# Patient Record
Sex: Female | Born: 1955 | Race: Black or African American | Hispanic: No | Marital: Single | Smoking: Current some day smoker
Health system: Southern US, Community
[De-identification: ages and names within clinical notes are randomized; demographics above are authoritative.]

## PROBLEM LIST (undated history)

## (undated) DIAGNOSIS — T7840XA Allergy, unspecified, initial encounter: Secondary | ICD-10-CM

## (undated) DIAGNOSIS — F329 Major depressive disorder, single episode, unspecified: Secondary | ICD-10-CM

## (undated) DIAGNOSIS — F32A Depression, unspecified: Secondary | ICD-10-CM

## (undated) DIAGNOSIS — M1990 Unspecified osteoarthritis, unspecified site: Secondary | ICD-10-CM

## (undated) DIAGNOSIS — D259 Leiomyoma of uterus, unspecified: Secondary | ICD-10-CM

## (undated) DIAGNOSIS — Z9289 Personal history of other medical treatment: Secondary | ICD-10-CM

## (undated) DIAGNOSIS — T63301A Toxic effect of unspecified spider venom, accidental (unintentional), initial encounter: Secondary | ICD-10-CM

## (undated) HISTORY — DX: Major depressive disorder, single episode, unspecified: F32.9

## (undated) HISTORY — DX: Allergy, unspecified, initial encounter: T78.40XA

## (undated) HISTORY — DX: Personal history of other medical treatment: Z92.89

## (undated) HISTORY — DX: Leiomyoma of uterus, unspecified: D25.9

## (undated) HISTORY — PX: SHOULDER SURGERY: SHX246

## (undated) HISTORY — DX: Depression, unspecified: F32.A

## (undated) HISTORY — DX: Toxic effect of unspecified spider venom, accidental (unintentional), initial encounter: T63.301A

## (undated) HISTORY — DX: Unspecified osteoarthritis, unspecified site: M19.90

## (undated) HISTORY — PX: TUBAL LIGATION: SHX77

---

## 2000-05-17 ENCOUNTER — Encounter: Admission: RE | Admit: 2000-05-17 | Discharge: 2000-05-17 | Payer: Self-pay | Admitting: Obstetrics

## 2002-11-04 ENCOUNTER — Encounter: Admission: RE | Admit: 2002-11-04 | Discharge: 2002-11-04 | Payer: Self-pay | Admitting: Sports Medicine

## 2003-12-19 ENCOUNTER — Encounter (INDEPENDENT_AMBULATORY_CARE_PROVIDER_SITE_OTHER): Payer: Self-pay | Admitting: *Deleted

## 2003-12-25 ENCOUNTER — Encounter: Admission: RE | Admit: 2003-12-25 | Discharge: 2003-12-25 | Payer: Self-pay | Admitting: Family Medicine

## 2003-12-28 ENCOUNTER — Encounter: Admission: RE | Admit: 2003-12-28 | Discharge: 2003-12-28 | Payer: Self-pay | Admitting: Sports Medicine

## 2004-01-14 ENCOUNTER — Encounter: Admission: RE | Admit: 2004-01-14 | Discharge: 2004-01-14 | Payer: Self-pay | Admitting: Family Medicine

## 2004-01-21 ENCOUNTER — Encounter: Admission: RE | Admit: 2004-01-21 | Discharge: 2004-01-21 | Payer: Self-pay | Admitting: Sports Medicine

## 2004-08-18 ENCOUNTER — Ambulatory Visit: Payer: Self-pay | Admitting: Family Medicine

## 2004-09-01 ENCOUNTER — Ambulatory Visit: Payer: Self-pay | Admitting: Sports Medicine

## 2004-09-19 ENCOUNTER — Ambulatory Visit: Payer: Self-pay | Admitting: Family Medicine

## 2005-05-16 ENCOUNTER — Ambulatory Visit: Payer: Self-pay | Admitting: Sports Medicine

## 2006-12-11 ENCOUNTER — Ambulatory Visit: Payer: Self-pay | Admitting: Family Medicine

## 2006-12-11 ENCOUNTER — Encounter (INDEPENDENT_AMBULATORY_CARE_PROVIDER_SITE_OTHER): Payer: Self-pay | Admitting: Family Medicine

## 2006-12-11 LAB — CONVERTED CEMR LAB
ALT: 23 units/L (ref 0–35)
AST: 28 units/L (ref 0–37)
Alkaline Phosphatase: 71 units/L (ref 39–117)
Creatinine, Ser: 0.85 mg/dL (ref 0.40–1.20)
Sodium: 143 meq/L (ref 135–145)
Total Bilirubin: 0.4 mg/dL (ref 0.3–1.2)

## 2006-12-13 DIAGNOSIS — N920 Excessive and frequent menstruation with regular cycle: Secondary | ICD-10-CM

## 2006-12-13 DIAGNOSIS — F339 Major depressive disorder, recurrent, unspecified: Secondary | ICD-10-CM | POA: Insufficient documentation

## 2006-12-13 DIAGNOSIS — D259 Leiomyoma of uterus, unspecified: Secondary | ICD-10-CM

## 2006-12-13 HISTORY — DX: Leiomyoma of uterus, unspecified: D25.9

## 2006-12-14 ENCOUNTER — Encounter (INDEPENDENT_AMBULATORY_CARE_PROVIDER_SITE_OTHER): Payer: Self-pay | Admitting: *Deleted

## 2007-01-10 ENCOUNTER — Encounter (INDEPENDENT_AMBULATORY_CARE_PROVIDER_SITE_OTHER): Payer: Self-pay | Admitting: Family Medicine

## 2007-01-10 ENCOUNTER — Encounter: Admission: RE | Admit: 2007-01-10 | Discharge: 2007-01-10 | Payer: Self-pay | Admitting: Sports Medicine

## 2007-01-24 ENCOUNTER — Encounter (INDEPENDENT_AMBULATORY_CARE_PROVIDER_SITE_OTHER): Payer: Self-pay | Admitting: Family Medicine

## 2007-02-12 ENCOUNTER — Encounter (INDEPENDENT_AMBULATORY_CARE_PROVIDER_SITE_OTHER): Payer: Self-pay | Admitting: Family Medicine

## 2007-02-15 ENCOUNTER — Emergency Department (HOSPITAL_COMMUNITY): Admission: EM | Admit: 2007-02-15 | Discharge: 2007-02-15 | Payer: Self-pay | Admitting: Emergency Medicine

## 2007-08-17 ENCOUNTER — Emergency Department (HOSPITAL_COMMUNITY): Admission: EM | Admit: 2007-08-17 | Discharge: 2007-08-17 | Payer: Self-pay | Admitting: Emergency Medicine

## 2007-09-26 ENCOUNTER — Ambulatory Visit (HOSPITAL_COMMUNITY): Admission: RE | Admit: 2007-09-26 | Discharge: 2007-09-26 | Payer: Self-pay | Admitting: Obstetrics & Gynecology

## 2008-02-25 ENCOUNTER — Emergency Department (HOSPITAL_COMMUNITY): Admission: EM | Admit: 2008-02-25 | Discharge: 2008-02-25 | Payer: Self-pay | Admitting: Emergency Medicine

## 2008-08-31 ENCOUNTER — Ambulatory Visit: Payer: Self-pay | Admitting: Family Medicine

## 2008-08-31 DIAGNOSIS — E669 Obesity, unspecified: Secondary | ICD-10-CM | POA: Insufficient documentation

## 2008-08-31 DIAGNOSIS — M654 Radial styloid tenosynovitis [de Quervain]: Secondary | ICD-10-CM

## 2008-08-31 DIAGNOSIS — M159 Polyosteoarthritis, unspecified: Secondary | ICD-10-CM

## 2008-09-16 ENCOUNTER — Encounter: Payer: Self-pay | Admitting: Family Medicine

## 2008-09-16 ENCOUNTER — Ambulatory Visit: Payer: Self-pay | Admitting: Family Medicine

## 2008-09-16 LAB — CONVERTED CEMR LAB
HDL: 70 mg/dL (ref >39)
LDL Cholesterol: 86 mg/dL (ref 0–99)
Total CHOL/HDL Ratio: 2.5
VLDL: 21 mg/dL (ref 0–40)

## 2008-09-17 ENCOUNTER — Encounter: Payer: Self-pay | Admitting: Family Medicine

## 2008-10-07 ENCOUNTER — Ambulatory Visit: Payer: Self-pay | Admitting: Family Medicine

## 2008-11-02 ENCOUNTER — Ambulatory Visit: Payer: Self-pay | Admitting: Family Medicine

## 2008-11-02 ENCOUNTER — Encounter: Payer: Self-pay | Admitting: Family Medicine

## 2008-11-02 DIAGNOSIS — M25539 Pain in unspecified wrist: Secondary | ICD-10-CM

## 2009-02-10 ENCOUNTER — Emergency Department (HOSPITAL_COMMUNITY): Admission: EM | Admit: 2009-02-10 | Discharge: 2009-02-10 | Payer: Self-pay | Admitting: Emergency Medicine

## 2009-10-18 ENCOUNTER — Encounter (INDEPENDENT_AMBULATORY_CARE_PROVIDER_SITE_OTHER): Payer: Self-pay | Admitting: *Deleted

## 2009-10-18 DIAGNOSIS — F17200 Nicotine dependence, unspecified, uncomplicated: Secondary | ICD-10-CM

## 2010-03-02 ENCOUNTER — Encounter: Admission: RE | Admit: 2010-03-02 | Discharge: 2010-03-02 | Payer: Self-pay | Admitting: Family Medicine

## 2010-03-02 ENCOUNTER — Ambulatory Visit: Payer: Self-pay | Admitting: Family Medicine

## 2010-03-02 ENCOUNTER — Encounter: Payer: Self-pay | Admitting: Family Medicine

## 2010-03-02 DIAGNOSIS — B351 Tinea unguium: Secondary | ICD-10-CM | POA: Insufficient documentation

## 2010-03-02 DIAGNOSIS — K625 Hemorrhage of anus and rectum: Secondary | ICD-10-CM | POA: Insufficient documentation

## 2010-03-02 DIAGNOSIS — D4959 Neoplasm of unspecified behavior of other genitourinary organ: Secondary | ICD-10-CM | POA: Insufficient documentation

## 2010-03-03 ENCOUNTER — Encounter: Payer: Self-pay | Admitting: Family Medicine

## 2010-03-03 LAB — CONVERTED CEMR LAB
ALT: 19 units/L (ref 0–35)
CO2: 23 meq/L (ref 19–32)
Chloride: 107 meq/L (ref 96–112)
Cholesterol: 197 mg/dL (ref 0–200)
LDL Cholesterol: 106 mg/dL — ABNORMAL HIGH (ref 0–99)
MCV: 81.4 fL (ref 78.0–100.0)
Platelets: 278 10*3/uL (ref 150–400)
Potassium: 4.3 meq/L (ref 3.5–5.3)
Sodium: 140 meq/L (ref 135–145)
Total Bilirubin: 0.3 mg/dL (ref 0.3–1.2)
Total Protein: 7.4 g/dL (ref 6.0–8.3)
VLDL: 17 mg/dL (ref 0–40)
WBC: 9.4 10*3/uL (ref 4.0–10.5)

## 2010-03-04 ENCOUNTER — Encounter: Payer: Self-pay | Admitting: Family Medicine

## 2010-03-04 LAB — CONVERTED CEMR LAB
Pap Smear: NEGATIVE
Pap Smear: NORMAL

## 2010-03-07 ENCOUNTER — Encounter: Admission: RE | Admit: 2010-03-07 | Discharge: 2010-03-07 | Payer: Self-pay | Admitting: Family Medicine

## 2010-10-27 ENCOUNTER — Emergency Department (HOSPITAL_COMMUNITY)
Admission: EM | Admit: 2010-10-27 | Discharge: 2010-10-27 | Payer: Self-pay | Source: Home / Self Care | Admitting: Family Medicine

## 2010-11-15 NOTE — Miscellaneous (Signed)
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 

## 2010-11-15 NOTE — Miscellaneous (Signed)
Summary: prior auth   please call patient and tell her to get the medicine for her toenails through [facility] $[facility] list so we don't have to do prior authorization.  please send [facility]fine [facility] choice. thanks, Ancil Boozer  MD  Mar 03, 2010 10:42 AM  No answer and no machine at home number.  She does not get to work till [employer].  Will try again later. ............................................... Doris Green Mar 03, 2010 11:58 AM  Attempted both numbers again.  No answer. ............................................... Doris Green Mar 03, 2010 5:26 PM  will await call back from patient. Ancil Boozer  MD  Mar 04, 2010 2:25 PM    Clinical Lists Changes  Appended Document: prior auth unable to reach pt

## 2010-11-15 NOTE — Assessment & Plan Note (Signed)
Summary: cpe and pap/kh   Vital Signs:  Patient profile:   55 year old female Menstrual status:  postmenopausal Height:      63 inches Weight:      222.7 pounds BMI:     39.59 Temp:     97.8 degrees F oral Pulse rate:   73 / minute BP sitting:   124 / 85  (left arm) Cuff size:   regular  Vitals Entered By: Garen Grams LPN (Mar 02, 2010 10:25 AM)  CC: cpe  Is Patient Diabetic? No Pain Assessment Patient in pain? no          Menstrual Status postmenopausal Last PAP Result NEGATIVE FOR INTRAEPITHELIAL LESIONS OR MALIGNANCY.   CC:  cpe .  History of Present Illness: here for CPE + PAP but has a few other concerns she wishes to address:  1) wrist pain: has been going on for years in R wrist.  overall feels like it is getting worse.  worsened by using vacuum cleaner for example at work which is a repetitive motion.  hasn't tried any medicines to help.  has been wearing thumb spica without relief.  was dx as tendonitis in the past but she isn't sure if this is the case.  she does note she has joint aches and pains diffusely but this is the worst area.  it hurts the worst over the prominence of the thumb and hurts sometimes so much in the thumb that it will radiate up the arm.  it doesn't feel numb ever.  she does think it swells sometimes.    2) toenails: has noticed thickened and peeling toenails for some time now.  wonders what she can do to treat them.   Habits & Providers  Alcohol-Tobacco-Diet     Alcohol drinks/day: 0     Tobacco Status: quit     Tobacco Counseling: to remain off tobacco products     Cigarette Packs/Day: <0.25     Pack years: 73 ish  Exercise-Depression-Behavior     Have you felt down or hopeless? no     Have you felt little pleasure in things? no     Depression Counseling: not indicated; screening negative for depression     STD Risk: never     Drug Use: never  Current Medications (verified): 1)  Thumbkeeper Splint .... For Dequervains  Tendonitis 2)  Terbinafine Hcl 250 Mg Tabs (Terbinafine Hcl) .Marland Kitchen.. 1 By Mouth Once Daily For 12 Weeks 3)  Meloxicam 7.5 Mg Tabs (Meloxicam) .Marland Kitchen.. 1 By Mouth Qam For Wrist Pain  Allergies (verified): No Known Drug Allergies  Past History:  Past Medical History:  former TOBACCO USER (ICD-305.1) WRIST PAIN, RIGHT (ICD-719.43) ? 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, HTN Brother Sherrine Maples): arthritis Brother Casimiro Needle): Healthy  Kids [place], [place], [place]): dtr with menses problems  Social History: single, 3 kids Channing Mutters, [place], [place]).  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 Quit smoking in 2011 with occasional relapse.  Denies ETOH.  No sexual activity since 2000, h/o GC.Smoking Status:  quit Drug Use:  never STD Risk:  never Packs/Day:  <0.25  Review of Systems       DENIES: headaches, chest pain, shortness of breath, abdominal pain, constipation, diarrhea, urinary urgency, urinary frequency, dysuria, urinary or stool incontinence, rashes, runny nose, cough, depression  COMPLAINS OF: occasional blood in stool, itching in vaginal area, joint aches and pains  Physical Exam  General:  Obese, well-developed/groomed adult female in no acute distress. VS reviewed.   Head:  Normocephalic and atraumatic without obvious abnormalities Eyes:  No corneal or conjunctival  inflammation noted. EOMI. Perrla.  Vision grossly normal. Ears:  External ear exam shows no significant lesions or deformities.  Otoscopic examination reveals clear canals, tympanic membranes are intact bilaterally without bulging, retraction, inflammation or discharge. Hearing is grossly normal bilaterally. Nose:  External nasal examination shows no deformity or inflammation. Nasal mucosa are pink and moist without lesions or exudates. Mouth:  Oral mucosa and oropharynx without lesions or exudates. multiple missing teeth but teeth present are in remarkably good repair.  Neck:  No deformities, masses, or tenderness noted. Lungs:  Normal respiratory effort, chest expands symmetrically. Lungs are clear to auscultation, no crackles or wheezes. Heart:  Normal rate and regular rhythm. S1 and S2 normal without gallop, murmur, click, rub or other extra sounds. Abdomen:  Bowel sounds positive,abdomen soft and non-tender without masses, organomegaly or hernias noted. Genitalia:  normal introitus.  plaque like area of scale to inferior right edge of introitous with some pigmented macules surrounding it with signs of excoriation.  nontender.   no vaginal discharge with mucosa pink and moist no vaginal or cervical lesions, no vaginal atrophy, friability or hemorrhage. uterus size and position grossly normal no adenexal masses or tenderness pap performed Msk:  R wrist with full range of motion.  R thumb with full range of motion. tenderness over thenar prominence. no joint swelling or warmth in R wrist/hand tender to palpation over thenar prominence R hand.   negative tinel's sign right hand negative finkelstein's test right hand pain with resisted extension of the thumb. Pulses:  2+ in all extremities Extremities:  No clubbing, cyanosis, edema, or deformity noted with normal full range of motion of all joints.   Neurologic:  alert & oriented X3, cranial nerves II-XII intact, and gait normal.   Skin:   multiple skin tags and seborrheic keratoses on body Psych:  Oriented X3 and good eye contact.  anxious appearing   Impression & Recommendations:  Problem # 1:  ROUTINE GYNECOLOGICAL EXAMINATION (ICD-V72.31) Assessment Unchanged overall in excellent health.  due for screening mammo and colonoscopy - handouts given for both to be scheduled.  pap performed today along with screening bloodwork.    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  will start some steroids topically and monitor for improvement.  if not significant improvement in 6 wks then would biopsy to be certain not cancerous.  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  discussed with patient that likely this will take up to 12 wks, maybe longer to be cured.     Her updated medication list for this problem includes:    Terbinafine Hcl 250 Mg Tabs (Terbinafine hcl) .Marland Kitchen... 1 by mouth once daily for 12 weeks  Orders: Comp Met-[facility] [id number]) [facility]- Est Level  3 (95621)  Problem # 4:  RECTAL BLEEDING (ICD-569.3) Assessment: New referred for colonoscopy.  check cbc.    Orders: CBC-[facility] (30865) [facility]- Est Level  3 (78469)  Complete Medication List: 1)  Thumbkeeper Splint  .... For dequervains tendonitis 2)  Terbinafine Hcl 250 Mg Tabs (Terbinafine hcl) .Marland Kitchen.. 1 by mouth once daily for 12 weeks 3)  Meloxicam 7.5 Mg Tabs (Meloxicam) .Marland Kitchen.. 1 by mouth qam for wrist pain  Other Orders: Pap Smear-[facility] ([id number]) Lipid-[facility] ([id number]) TSH-[facility] ([id number]) Diagnostic X-Ray/Fluoroscopy (Diagnostic X-Ray/Flu)  Patient Instructions: 1)  I will call you with lab and xray results and plans for follow up. 2)  be sure to schedule your mammogram and colonoscopy.  Prescriptions: MELOXICAM 7.5 MG TABS (MELOXICAM) 1  by mouth QAM for wrist pain  #30 x 1   Entered and Authorized by:   Ancil Boozer  MD   Signed by:   Ancil Boozer  MD on 03/02/2010   Method used:   Electronically to        [facility] [address] [phone]* (retail)       [address]       [place], [place]  [address]       Ph: [phone] or [phone]       Fax: [phone]   RxID:   [id number] TERBINAFINE HCL 250 MG TABS (TERBINAFINE HCL) 1 by mouth once daily for 12 weeks  #120 x 0   Entered and Authorized by:   Ancil Boozer  MD   Signed by:   Ancil Boozer  MD on 03/02/2010   Method used:   Electronically to        [facility] [address] [phone]* (retail)       [address]       [place], [place]  [address]       Ph: [phone] or [phone]       Fax: [phone]   RxID:   [id number]   Prevention & Chronic Care Immunizations   Influenza vaccine: Not documented    Tetanus booster: Not documented    Pneumococcal vaccine: Not documented  Colorectal Screening   Hemoccult: Not documented   Hemoccult due: Refused  (09/16/2008)    Colonoscopy: Not documented   Colonoscopy due: Refused  (09/16/2008)  Other Screening   Pap smear: NEGATIVE FOR INTRAEPITHELIAL LESIONS OR MALIGNANCY.  (09/16/2008)   Pap smear due: 09/17/2011    Mammogram: normal  (01/10/2007)   Mammogram due: 01/2008   Smoking status: quit  (03/02/2010)  Lipids   Total Cholesterol: 177  (09/16/2008)   LDL: 86  (09/16/2008)   LDL Direct: Not documented   HDL: 70  (09/16/2008)   Triglycerides: 103  (09/16/2008)  Laboratory Results  Date/Time Received: Mar 02, 2010 11:32 AM  Date/Time Reported: Mar 02, 2010 11:56 AM   Other Tests  Skin KOH: Negative Comments: ...............test performed by......Marland KitchenBonnie A. [place], MLS (ASCP)cm

## 2010-11-15 NOTE — Letter (Signed)
Summary: Generic Letter  [facility]  [address]   [place], [place] [address]   Phone: [phone]  Fax: [phone]    03/04/2010  Doris Green [address] [place], [place]  [address]  Dear [name],   Your recent pap smear was normal. Your next pap is due in 3 years.       Sincerely,   Ancil Boozer  MD  Appended Document: Generic Letter mailed.

## 2010-12-21 ENCOUNTER — Ambulatory Visit (INDEPENDENT_AMBULATORY_CARE_PROVIDER_SITE_OTHER): Payer: BC Managed Care – PPO | Admitting: Family Medicine

## 2010-12-21 ENCOUNTER — Encounter: Payer: Self-pay | Admitting: Family Medicine

## 2010-12-21 ENCOUNTER — Ambulatory Visit (HOSPITAL_COMMUNITY)
Admission: RE | Admit: 2010-12-21 | Discharge: 2010-12-21 | Disposition: A | Discharge status: Home / Self Care | Payer: BC Managed Care – PPO | Source: Ambulatory Visit | Attending: Family Medicine | Admitting: Family Medicine

## 2010-12-21 VITALS — BP 132/78 | HR 72 | Temp 98.1°F | Wt 224.7 lb

## 2010-12-21 DIAGNOSIS — M7730 Calcaneal spur, unspecified foot: Secondary | ICD-10-CM | POA: Insufficient documentation

## 2010-12-21 DIAGNOSIS — M79671 Pain in right foot: Secondary | ICD-10-CM

## 2010-12-21 DIAGNOSIS — M25579 Pain in unspecified ankle and joints of unspecified foot: Secondary | ICD-10-CM | POA: Insufficient documentation

## 2010-12-21 DIAGNOSIS — M79609 Pain in unspecified limb: Secondary | ICD-10-CM

## 2010-12-21 IMAGING — CR DG ANKLE 2V *R*
2 series · 2 of 2 positions shown · non-contrast
Comparison: Plain films of the right ankle [DATE].

CLINICAL DATA: Pain and swelling.

RIGHT ANKLE - 2 VIEW

[t ankle joint ap right]
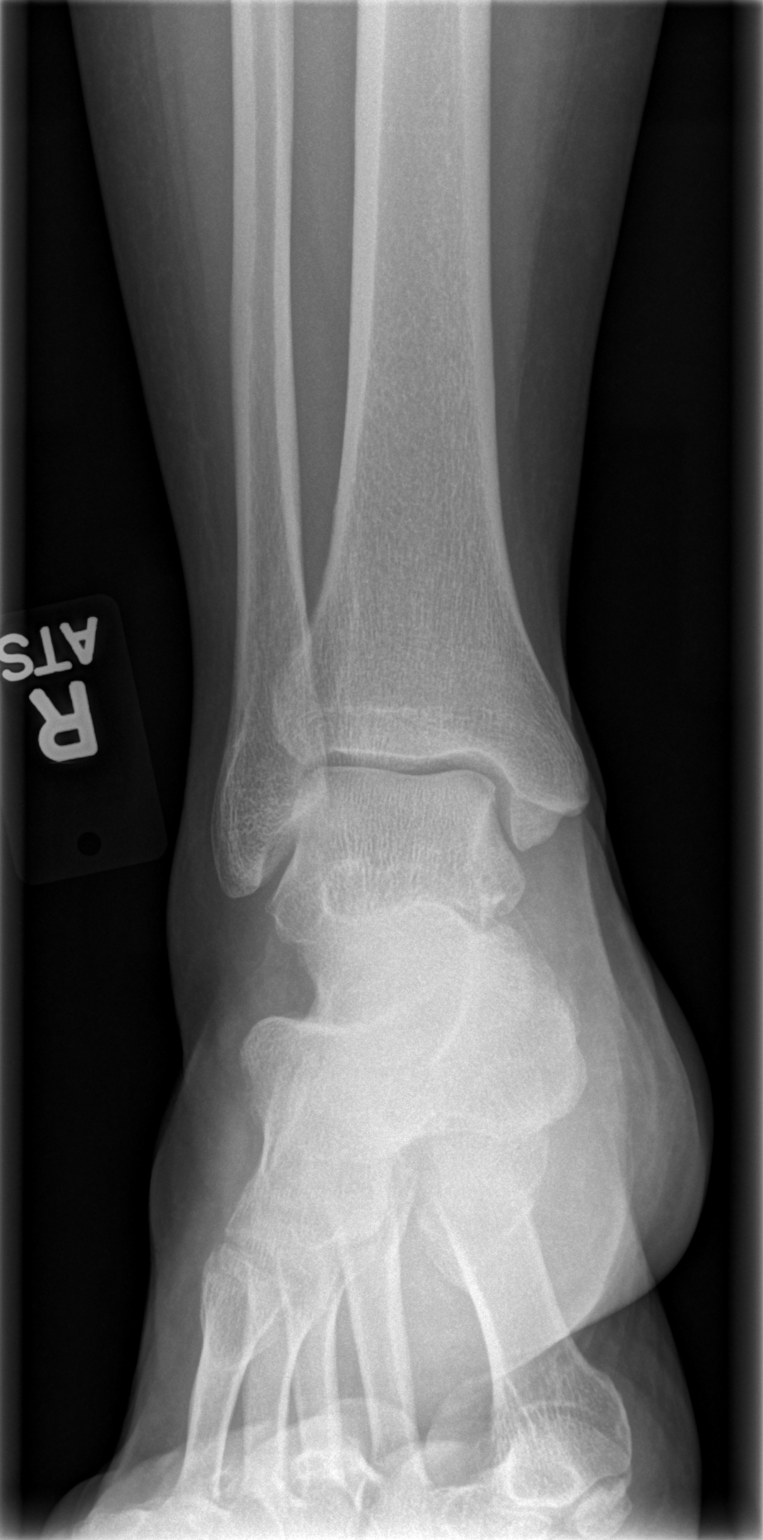

[t ankle joint lat right]
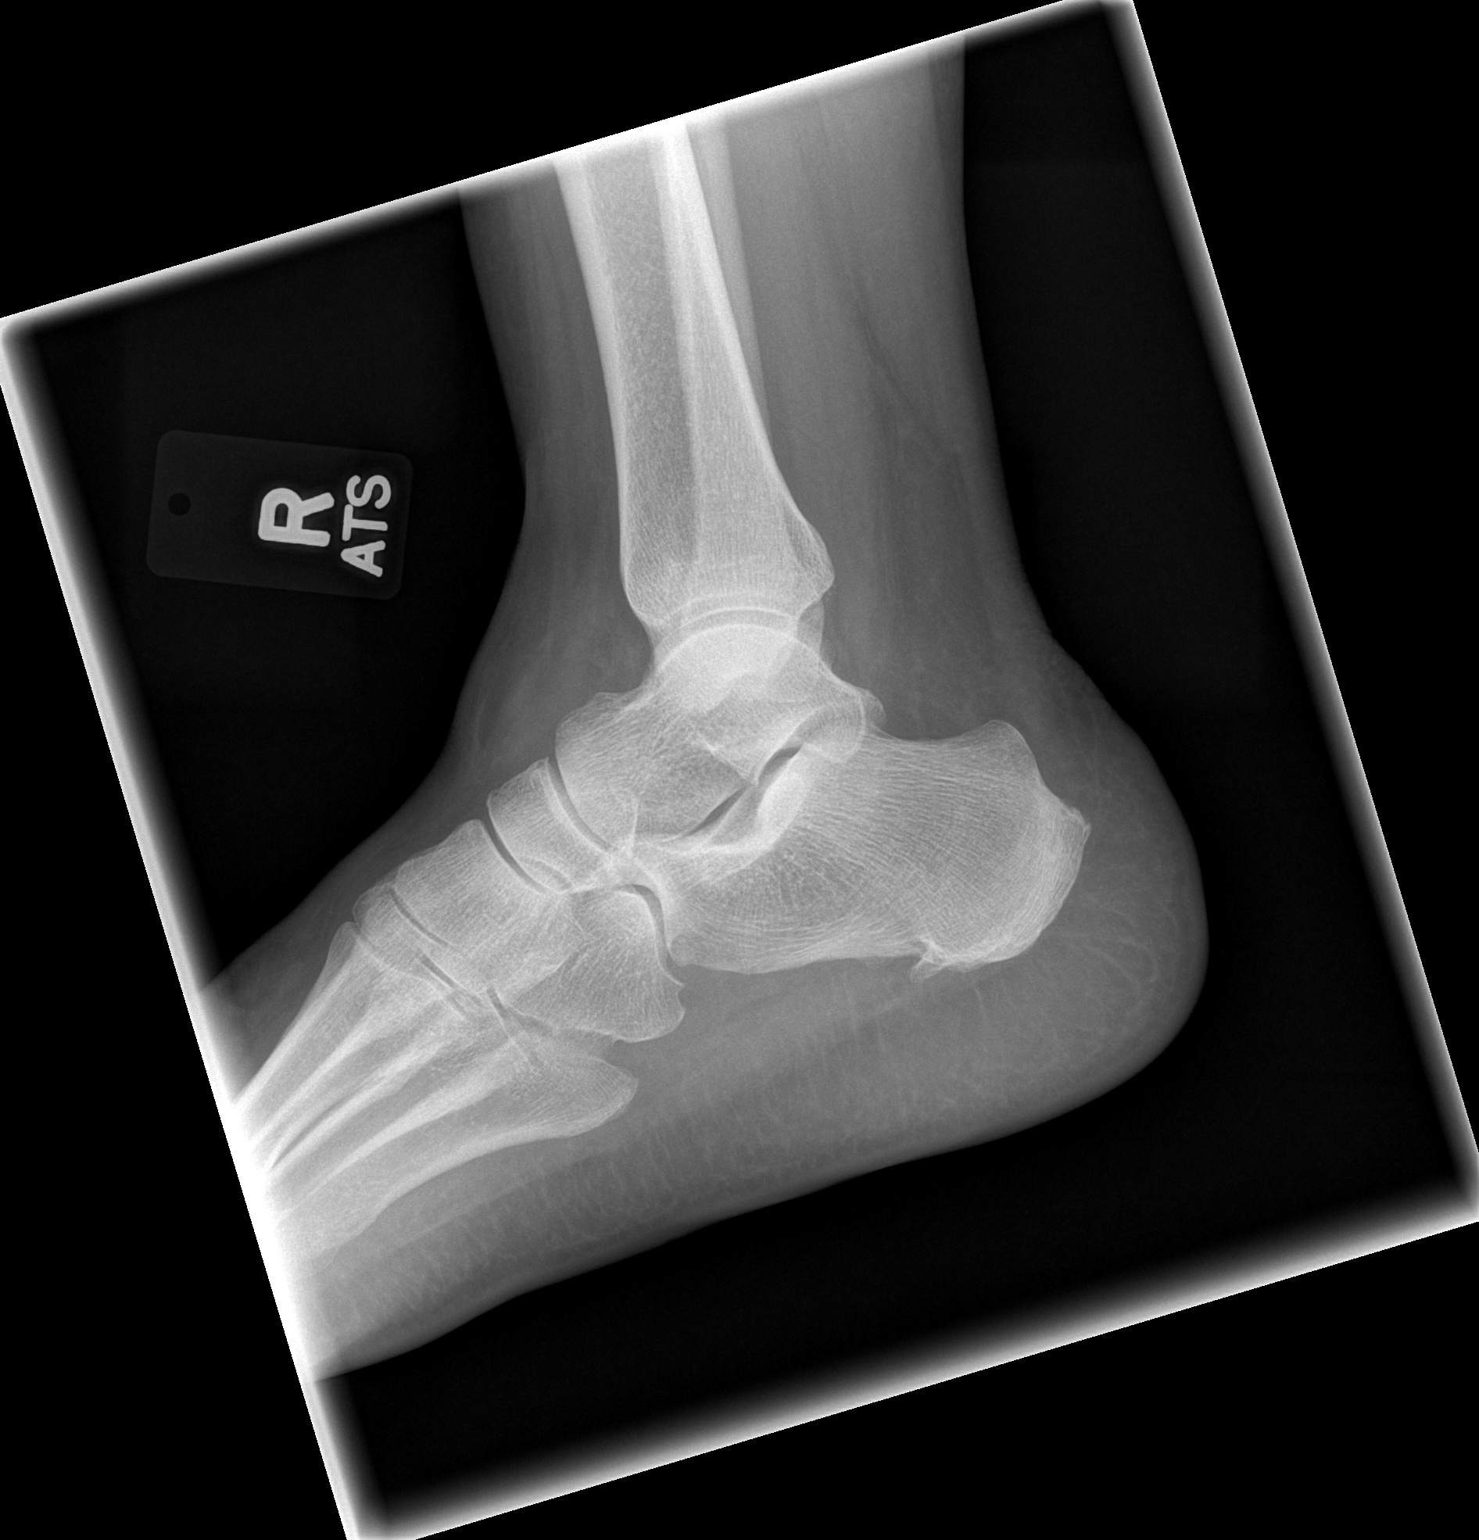

[2 of 2 positions shown; findings below may reference images not displayed]

FINDINGS: No acute bony or joint abnormality is identified.  The
patient has an unchanged small plantar calcaneal spur.  Soft
tissues unremarkable.
IMPRESSION: No change in a small plantar calcaneal spur.  No acute finding.

## 2010-12-21 MED ORDER — IBUPROFEN 600 MG PO TABS: 600.0000 mg | ORAL_TABLET | Freq: Four times a day (QID) | ORAL | Status: DC | PRN

## 2010-12-21 NOTE — Assessment & Plan Note (Signed)
Most likely achilles tendonitis.  Has been there for about 6 months.  Getting a little bit worse.  Worse in the morning.  She has not tried anything for it.  Will get x-ray to rule out stress fracture.  May need referral to sports medicine for ultrasound and ? NTG patch.  Will treat with NSAIDs for now and gave patient handout for home treatment.

## 2010-12-21 NOTE — Progress Notes (Signed)
  Subjective:    Patient ID: De Blanch, female    DOB: 08-06-1956, 55 y.o.   MRN: [id number]  Ankle Pain  Incident onset: No incident. There was no injury mechanism. The pain is present in the right heel (In the tendon and into the right heel). The quality of the pain is described as aching, burning, cramping and shooting. The pain is at a severity of 5/10. The pain is moderate. The pain has been worsening since onset. Associated symptoms include a loss of motion. Pertinent negatives include no inability to bear weight, loss of sensation, muscle weakness, numbness or tingling. The symptoms are aggravated by movement (worse in the morning). She has tried nothing for the symptoms.      Review of Systems  Neurological: Negative for tingling and numbness.       Objective:   Physical Exam  Vitals reviewed. Constitutional:       Obese   Musculoskeletal:       Right ankle:  TTP along achilles tendon and into the heel.  Pain reproduced with plantar flexion.  Limited dorsiflexion because of stiffness.  Right plantar fascia:  Non tender  Left ankle: normal ROM.  No tenderness          Assessment & Plan:

## 2010-12-21 NOTE — Patient Instructions (Signed)
Achilles Tendonitis (Tendinitis) Tendonitis a swelling and soreness of the tendon. The pain in the tendon (cord like structure which attaches muscle to bone) is produced by tiny tears and the inflammation present in that tendon. It commonly occurs at the shoulders, heels, and elbows. It is usually caused by overusing the tendon and joint involved. Achilles tendonitis involves the Achilles tendon. This is the large tendon in the back of the leg just above the foot. It attaches the large muscles of the lower leg to the heel bone (called calcaneus).   This diagnosis (learning what is wrong) is made by examination. X-rays will be generally be normal if only tendonitis is present. HOME CARE INSTRUCTIONS  Apply ice to the injury for 10 minutes 4 times per day. Put the ice in a plastic bag and place a towel between the bag of ice and your skin.   Try to avoid use other than gentle range of motion while the tendon is painful. Do not resume use until instructed by your caregiver. Then begin use gradually. Do not increase use to the point of pain. If pain does develop, decrease use and continue the above measures. Gradually increase activities that do not cause discomfort until you gradually achieve normal use.   Only take over-the-counter or prescription medicines for pain, discomfort, or fever as directed by your caregiver.  SEEK MEDICAL CARE IF:  Your pain and swelling increase or pain is uncontrolled with medications.   You develop new, unexplained problems (symptoms) or an increase of the symptoms that brought you to your caregiver.   You develop an inability to move your toes or foot, develop warmth and swelling in your foot, or begin running an unexplained temperature.  MAKE SURE YOU:    Understand these instructions.   Will watch your condition.   Will get help right away if you are not doing well or get worse.   Please schedule a follow up appointment in 2-3 weeks to check on your pain  We  will let you know of the x-ray results Document Released: 07/12/2005 Document Re-Released: 12/29/2008 [facility] Patient Information 2011 [place], [place].

## 2010-12-22 ENCOUNTER — Telehealth: Payer: Self-pay | Admitting: Family Medicine

## 2010-12-22 NOTE — Telephone Encounter (Signed)
Discussed with patient her x-ray results and that there were no fractures.

## 2011-01-30 ENCOUNTER — Emergency Department (HOSPITAL_COMMUNITY)
Admission: EM | Admit: 2011-01-30 | Discharge: 2011-01-30 | Disposition: A | Discharge status: Home / Self Care | Payer: BC Managed Care – PPO | Attending: Emergency Medicine | Admitting: Emergency Medicine

## 2011-01-30 DIAGNOSIS — M7989 Other specified soft tissue disorders: Secondary | ICD-10-CM | POA: Insufficient documentation

## 2011-01-30 DIAGNOSIS — IMO0002 Reserved for concepts with insufficient information to code with codable children: Secondary | ICD-10-CM | POA: Insufficient documentation

## 2011-04-05 ENCOUNTER — Other Ambulatory Visit: Payer: Self-pay | Admitting: Family Medicine

## 2011-04-05 DIAGNOSIS — Z1231 Encounter for screening mammogram for malignant neoplasm of breast: Secondary | ICD-10-CM

## 2011-04-20 ENCOUNTER — Other Ambulatory Visit: Payer: Self-pay | Admitting: Family Medicine

## 2011-04-20 ENCOUNTER — Ambulatory Visit (HOSPITAL_COMMUNITY)
Admission: RE | Admit: 2011-04-20 | Discharge: 2011-04-20 | Disposition: A | Discharge status: Home / Self Care | Payer: BC Managed Care – PPO | Source: Ambulatory Visit | Attending: Family Medicine | Admitting: Family Medicine

## 2011-04-20 ENCOUNTER — Ambulatory Visit: Payer: BC Managed Care – PPO

## 2011-04-20 DIAGNOSIS — Z1231 Encounter for screening mammogram for malignant neoplasm of breast: Secondary | ICD-10-CM | POA: Insufficient documentation

## 2011-06-01 ENCOUNTER — Inpatient Hospital Stay (INDEPENDENT_AMBULATORY_CARE_PROVIDER_SITE_OTHER)
Admission: RE | Admit: 2011-06-01 | Discharge: 2011-06-01 | Disposition: A | Discharge status: Home / Self Care | Payer: BC Managed Care – PPO | Source: Ambulatory Visit | Attending: Emergency Medicine | Admitting: Emergency Medicine

## 2011-06-01 DIAGNOSIS — K625 Hemorrhage of anus and rectum: Secondary | ICD-10-CM

## 2011-07-25 LAB — CBC
Hemoglobin: 12.4
MCHC: 32.6
MCV: 81.5
RBC: 4.65
WBC: 11.4 — ABNORMAL HIGH

## 2011-07-25 LAB — POCT URINALYSIS DIP (DEVICE)
Protein, ur: NEGATIVE
Specific Gravity, Urine: >=1.03
pH: 5.5

## 2011-07-25 LAB — URINE CULTURE

## 2011-07-25 LAB — URINALYSIS, MICROSCOPIC ONLY
Bilirubin Urine: NEGATIVE
Glucose, UA: NEGATIVE
Ketones, ur: NEGATIVE
Protein, ur: NEGATIVE
pH: 5.5

## 2011-07-25 LAB — DIFFERENTIAL
Basophils Relative: 0
Eosinophils Absolute: 0.1
Lymphs Abs: 3.8 — ABNORMAL HIGH
Monocytes Absolute: 0.4
Monocytes Relative: 4
Neutro Abs: 7

## 2011-07-25 LAB — POCT PREGNANCY, URINE: Operator id: 247071

## 2011-07-25 LAB — POCT I-STAT CREATININE: Operator id: 247071

## 2011-07-25 LAB — I-STAT 8, (EC8 V) (CONVERTED LAB)
BUN: 11
Glucose, Bld: 115 — ABNORMAL HIGH
Potassium: 3.3 — ABNORMAL LOW
TCO2: 25
pH, Ven: 7.401 — ABNORMAL HIGH

## 2011-10-12 ENCOUNTER — Encounter: Payer: Self-pay | Admitting: Family Medicine

## 2011-10-12 ENCOUNTER — Ambulatory Visit (INDEPENDENT_AMBULATORY_CARE_PROVIDER_SITE_OTHER): Payer: BC Managed Care – PPO | Admitting: Family Medicine

## 2011-10-12 VITALS — BP 139/94 | HR 80 | Temp 98.2°F | Ht 63.0 in | Wt 224.9 lb

## 2011-10-12 DIAGNOSIS — M79609 Pain in unspecified limb: Secondary | ICD-10-CM

## 2011-10-12 DIAGNOSIS — R232 Flushing: Secondary | ICD-10-CM

## 2011-10-12 DIAGNOSIS — M79671 Pain in right foot: Secondary | ICD-10-CM

## 2011-10-12 DIAGNOSIS — F17200 Nicotine dependence, unspecified, uncomplicated: Secondary | ICD-10-CM

## 2011-10-12 DIAGNOSIS — E669 Obesity, unspecified: Secondary | ICD-10-CM

## 2011-10-12 DIAGNOSIS — Z0000 Encounter for general adult medical examination without abnormal findings: Secondary | ICD-10-CM

## 2011-10-12 DIAGNOSIS — R51 Headache: Secondary | ICD-10-CM

## 2011-10-12 DIAGNOSIS — N951 Menopausal and female climacteric states: Secondary | ICD-10-CM

## 2011-10-12 DIAGNOSIS — F339 Major depressive disorder, recurrent, unspecified: Secondary | ICD-10-CM

## 2011-10-12 LAB — LIPID PANEL
Cholesterol: 189 mg/dL (ref 0–200)
HDL: 66 mg/dL (ref >39)
Total CHOL/HDL Ratio: 2.9 Ratio
Triglycerides: 168 mg/dL — ABNORMAL HIGH (ref <150)
VLDL: 34 mg/dL (ref 0–40)

## 2011-10-12 MED ORDER — SPENCO ORTHOTIC ARCH SUPPORTS MISC: 1.0000 [IU] | Freq: Every day | Status: DC | PRN

## 2011-10-12 MED ORDER — ACETAMINOPHEN 325 MG PO TABS: 650.0000 mg | ORAL_TABLET | Freq: Three times a day (TID) | ORAL | Status: AC | PRN

## 2011-10-12 NOTE — Patient Instructions (Addendum)
For your right foot, try the Tylenol 650 mg every 8 hours as needed for pain.  Continue to do the foot stretches. We will also refer you to physical therapy.  Wear flat, well-cushioned shoes. Try the orthotic foot inserts at the pharmacy.  Follow-up in 1 month for this problem.  Pains in head. Try the Tylenol. Let me know if this gets worse. Follow-up in 1 month for this problem also.   If your lab results are normal, I will send you a letter with the results. If abnormal, someone at the clinic will get in touch with you.   We will also discuss your hot flashes at that time.  It was nice to meet you.  Take care and Happy New Year.

## 2011-10-12 NOTE — Assessment & Plan Note (Signed)
New problem. Started 2-3 months ago. Not headachey pain but seems to have sharp pains along scalp lasting few moments and occuring a few times a day. Patient concerned since this is how her family members' multiple sclerosis first presented. At this time, advised to take Tylenol prn. Will follow-up in 1 month. No neurological abnormalities. Patient advised to let me know if she develops worsening head pains or other worrisome symptoms and advised to come in sooner if needed.

## 2011-10-12 NOTE — Assessment & Plan Note (Signed)
Documentation only. Patient advised to make appointment to discuss hot flashes further. Due to multiple medical issues today, we did not have time to talk about this.

## 2011-10-12 NOTE — Assessment & Plan Note (Signed)
Patient has diagnosis of depression but is not on any medications currently. She has had a sister and ?nephew pass away recently but seems to be handling things well. Does not appear depressed today.

## 2011-10-12 NOTE — Assessment & Plan Note (Signed)
Persistent pain. She has not followed-up with this issue but brought this issue up during scheduled physical exam visit today. She reports pain is getting worse and now radiating up her leg. No worrisome neurological exam findings today and ROM seems improved from her previous visit for this issue in March 2012. Ibuprofen caused GI bleeding. Recommending Tylenol and referring to physical therapy. Shown ROM exercises today. If pain worse at next visit in 1 month, will refer to Sports Medicine for ultrasound and management.

## 2011-10-12 NOTE — Assessment & Plan Note (Signed)
Relapsed. Had quit for several months but re-started due to recent stressors. Patient wants to try quitting again. Instead of smoking, will try to take a walk or do some other activity when she feels stressed and has urge to smoke.

## 2011-10-12 NOTE — Progress Notes (Signed)
**Note Doris-Identified via Obfuscation**   Subjective:    Patient ID: Doris Green, female    DOB: Jan 14, 1956, 55 y.o.   MRN: [id number]  HPI Here initially for physical but wanted to discuss discrete issues instead:  1. Right heel pain Last saw physician in March and this issue was discussed.  Progression: worsening now pain is radiating up leg to hip Severity: bothers her but does not limit what she does. 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 Prickly pains in scalp Lasts few seconds. Has been going on past 2-3 months.  Location: different points throughout head, usually top left side of head Alleviated by: nothing Associated with: not with photophobia or nausea/vomiting Exacerbated by: nothing Concerns: couple of family members with multiple sclerosis (recently passed away, sister and ?nephew) and this is how they first presented ROS: denies depression, feels like she is coping well with recent death of sister a few months ago and nephew 3 days before Christmas  3. Tobacco Smoking 2 cigarettes daily Stopped smoking several months ago but re-started following death of nephew Doesn't like taste of smoking but helps relieve her  Father died of lung cancer and she knows smoking is bad for her  4. Hot flashes Severe LMP several years ago ROS: denies spotting. Not sexually active currently.    Review of Systems Per HPI. Denies vision changes Denies loss of bladder or bowel    Objective:   Physical Exam Gen: NAD, obese Psych: appears anxious, slightly irritated initially (felt like in the past, coming to clinic did not help her problems), appropriate to questions Neuro: alert and oriented; no focal deficits; normal gait; normal sensation and strength lower extremities CV: RRR no m/r/g Pulm: CTAB no w/r/r, no increased WOB, good effort  Abd: NABS, soft, NT, ND, obese Ext: no edema MSK:   RF: ROM intact; no pain with  dorsi or plantar flexion (or along plantar aspect of foot with these maneuvers); mild pain with palpation of achilles tendon; no warmth, erythema, or swelling bilaterally    Assessment & Plan:

## 2011-10-19 ENCOUNTER — Encounter: Payer: Self-pay | Admitting: Family Medicine

## 2011-10-23 ENCOUNTER — Telehealth: Payer: Self-pay | Admitting: Family Medicine

## 2011-10-23 NOTE — Telephone Encounter (Signed)
Spoke with patient and provided clarification on labs. In MD's letter to patient she asks patient to call if interested in health coaching. Patient would like to take advantage of this. Will forward to [place] to set up patient.

## 2011-10-23 NOTE — Telephone Encounter (Signed)
Would like to speak to someone about her letter regarding her physical.

## 2011-10-24 ENCOUNTER — Telehealth: Payer: Self-pay | Admitting: Home Health Services

## 2011-10-24 NOTE — Telephone Encounter (Signed)
Spoke with pt about health coaching and will schedule appointment for February.

## 2011-10-24 NOTE — Telephone Encounter (Signed)
Spoke with Doris Green  Pt is interested in [employer] for smoking cessation, exercise and weight loss.  Pt is highly motivated and has already stopped smoking for 8 days.  Pt also identifies not having a social life as a problem and snacking late at night.  Pt also reports not having an exercise routine.  Pt would like to schedule Health Coaching appointment for February with health coaching calls starting next week.  Pt's overall goals are to be established but 1st to quit smoking.

## 2011-11-06 ENCOUNTER — Ambulatory Visit: Payer: BC Managed Care – PPO | Attending: Family Medicine | Admitting: Physical Therapy

## 2011-11-24 ENCOUNTER — Ambulatory Visit (INDEPENDENT_AMBULATORY_CARE_PROVIDER_SITE_OTHER): Payer: BC Managed Care – PPO | Admitting: Family Medicine

## 2011-11-24 ENCOUNTER — Encounter: Payer: Self-pay | Admitting: Family Medicine

## 2011-11-24 DIAGNOSIS — E669 Obesity, unspecified: Secondary | ICD-10-CM

## 2011-11-24 DIAGNOSIS — F17200 Nicotine dependence, unspecified, uncomplicated: Secondary | ICD-10-CM

## 2011-11-24 NOTE — Patient Instructions (Signed)
1. Good job on quitting smoking!! Think about ways to relieve stress instead of smoking. (eating, reading, listen to music) 2. Exercise 2 times this next week. 3. Continue with weekly phone calls with Rosalita Chessman for accountability 4. Think about ways to change eating schedule.  Try not to go more than 5 hours without eating.

## 2011-11-24 NOTE — Progress Notes (Signed)
  Subjective:    Patient ID: De Blanch, female    DOB: Oct 07, 1956, 56 y.o.   MRN: [id number]  HPI The patient was counseled on the dangers of tobacco use, and was advised to quit and pt reported that she quit smoking 3 weeks previously. .  Reviewed strategies to maximize success, including pt reported that she quit buying cigaretts and avoids places where she might purchase them. .   OBESITY Current weight/BMI : Body mass index is 39.73 kg/(m^2).    How long have been obese:  10+ yrs Course:  Worsening Problems or symptoms it causes:  Pain in joints   Things have tried to improve:  Portion control   Patient Identified Concern:  Wt loss, remaining tobacco free Stage of Change Patient Is In:  Contemplation, pt plans on making changes in the next 6 months Patient Reported Barriers:  Habits, doesn't have time, lack of motivation Patient Reported Perceived Benefits:  Feel better, health Patient Reports Self-Efficacy:   Pt displays some self efficacy Behavior Change Supports:  To be determined Goals:  Pt is going to exercise 2 times this week.  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  Patient Education:  We talked about health behavior including proper sleep, not skipping meals, types of healthy foods, schedule time for exercise, managing stress, strategies to avoid smoking when stressed.      Review of Systems     Objective:   Physical Exam  Gen: No acute distress.     Assessment & Plan:

## 2011-11-24 NOTE — Assessment & Plan Note (Signed)
Assessment:  Worsening  Plan:  Continued counseling with health educator.  Pt set goal to exercise 2-3 times a week.  She also will work on not skipping meals and making healthier choices.

## 2011-11-24 NOTE — Assessment & Plan Note (Signed)
Assessment:  Improved  Pt reports not smoking for the past 3 weeks.  Plan:  Continued counseling with health educator.  Discussed strategies of things to do when she feels stressed instead of smoking. This includes eating, watching tv, going to church

## 2011-12-01 ENCOUNTER — Telehealth: Payer: Self-pay | Admitting: Home Health Services
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
## 2011-12-08 ENCOUNTER — Telehealth: Payer: Self-pay | Admitting: Home Health Services
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
## 2011-12-12 ENCOUNTER — Other Ambulatory Visit: Payer: Self-pay | Admitting: Occupational Medicine

## 2011-12-12 ENCOUNTER — Ambulatory Visit: Payer: Self-pay

## 2011-12-12 DIAGNOSIS — M25519 Pain in unspecified shoulder: Secondary | ICD-10-CM

## 2011-12-19 ENCOUNTER — Ambulatory Visit: Payer: BC Managed Care – PPO | Admitting: Family Medicine

## 2012-01-05 ENCOUNTER — Telehealth: Payer: Self-pay | Admitting: Home Health Services

## 2012-01-05 NOTE — Telephone Encounter (Signed)
Spoke with Corrie Dandy.  Pt reports having a good week.  She has been taking it easy this week and feels good.  Pt reports not eating at night any more and hasn't smoked for 2 months.  Pt reports not exercising this week and that concerns her.  Pt set goal to exercise this week and the gym and to continue weekly health coaching phone calls.  Pt's overall goal is weight loss.

## 2012-01-19 ENCOUNTER — Telehealth: Payer: Self-pay | Admitting: Home Health Services

## 2012-01-19 NOTE — Telephone Encounter (Signed)
Spoke with Doris Green.  Pt reports feeling good. Stress level is down.  Pt reports exercising 2x for the past two weeks [place]  Walks approx. 45 minutes.  Pt also reports now eating throughout the day versus eating at night.  Pt reports sleeping better, doesn't feel sluggish and feels her clothes are fitting better.    Pt reports should pain and has a scheduled surgery on 4/18.  Pt set goal to increase the amount of time walking to 1-1.5 hours 3x a week.   Pt's overall goal is weight loss.

## 2012-02-09 ENCOUNTER — Telehealth: Payer: Self-pay | Admitting: Home Health Services

## 2012-02-09 NOTE — Telephone Encounter (Signed)
Spoke with Doris Green.  Pt reports doing okay.  Had shoulder surgery last week and will be recovering at home for next 4 weeks.   Pt reports feeling good, with low stress.  She reports being at peace with family members and friends.  Pt reports little pain with shoulder and only takes pain meds as needed.  Pt reports sleeping better and has recently stopped walking due to surgery.  Pt reports having desire to smoke but has not smoke any cigarettes in several months.  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   Pt set goal for this week to recover with shoulder (possible PT) and to continue to get enough hours with [employer].  Pt will contact Health Coach next week with results.  Pt's overall goal is to continue to abstain from smoking, diet/exercise behavior changes.

## 2012-02-22 ENCOUNTER — Telehealth: Payer: Self-pay | Admitting: Home Health Services

## 2012-02-22 NOTE — Telephone Encounter (Signed)
Spoke with Corrie Dandy.  Pt reports feeling okay since arm surgery.  She has physical therapy schedule and is currently out of work recovering for a few weeks.  Pt reports some range of motion is back but does not have full range of motion in shoulder.  Pt reports not exercising or walking since surgery.  Pt started walked again today.   Pt expressed concerned with not losing weight quickly.  We talked about behavior changes that would be needed to lose weight quickly including increased exercise and decreased calories.    She feels now is the time to lose weight and is willing to try and make new changes.   Pt set goal [place]  She will also keep a food journal for 3 days to be discussed at next coaching phone call.  Pt reports not smoking.  Has been smoke free for 5 months.   Pt's overall goal is weight loss.

## 2012-03-22 ENCOUNTER — Telehealth: Payer: Self-pay | Admitting: Home Health Services

## 2012-03-22 NOTE — Telephone Encounter (Signed)
Spoke with Doris Green.  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  Pt will call back to start again once she is settled in home.

## 2012-12-24 ENCOUNTER — Encounter: Payer: Self-pay | Admitting: Family Medicine

## 2012-12-24 ENCOUNTER — Ambulatory Visit (INDEPENDENT_AMBULATORY_CARE_PROVIDER_SITE_OTHER): Payer: BC Managed Care – PPO | Admitting: Family Medicine

## 2012-12-24 VITALS — BP 143/83 | HR 83 | Temp 98.0°F | Ht 63.0 in | Wt 230.5 lb

## 2012-12-24 DIAGNOSIS — E669 Obesity, unspecified: Secondary | ICD-10-CM

## 2012-12-24 DIAGNOSIS — Z0000 Encounter for general adult medical examination without abnormal findings: Secondary | ICD-10-CM

## 2012-12-24 NOTE — Assessment & Plan Note (Signed)
She gained 6 lbs since last visit.  She will try going to gym which she joined yesterday.  Follow-up in 1-2 months.  She has seen Rosalita Chessman in the past. Consider referring back at follow-up if no weight loss.

## 2012-12-24 NOTE — Patient Instructions (Addendum)
Make a lab visit in the morning. Do not eat anything after midnight. Water is okay. I will call you to discuss results.   For your congestion try OTC allergy medication (Allegra, Zyrtec), if this does not help, follow-up   Call over to [facility] to set up your mammogram [phone]  We will give you information about the colonoscopy  Follow-up in 1-2 months to discuss your weight

## 2012-12-24 NOTE — Progress Notes (Signed)
**Note Doris-Identified via Obfuscation**   Subjective:    Patient ID: Doris Green, female    DOB: 04-09-1956, 57 y.o.   MRN: [id number]  HPI # Preventative visit Colonoscopy: she never had; she endorses occasional blood in stool; she gets constipated however probiotics help keep her regular   Mammogram: she missed her mammogram last year   Lipid panel: last checked 09/2011  LMP years ago   # Obesity Gym membership yesterday Work schedule prohibits her from exercising because she works late at [employer]    Review of Systems Denies chest pain or difficulties breathing Endorses congestion when she goes to work that resolves at home and during the summer when she does not have work  Endorses excessive thirst  Denies abnormal vaginal discharge or bleeding    Allergies, medication, past medical history reviewed.  Smoking status noted.  Tobacco--quit 8-10 months ago   Family history of diabetes     Objective:   Physical Exam GEN: NAD; obese PSYCH: pleasant CV: RRR, no m/r/g PULM: NI WOB; CTAB ABD: soft, NT, ND EXT: mild non pitting pedal edema bilaterally NEURO: moves all extremities well; GAIT normal     Assessment & Plan:

## 2012-12-24 NOTE — Assessment & Plan Note (Addendum)
Information given on colonoscopy. History of occasional blood in stool.  Number given to schedule mammogram.  Checking BMET (Cr, Glc) today.  Advised to make lab visit when fasting for lipid panel.  See obesity A/P

## 2012-12-25 LAB — BASIC METABOLIC PANEL
CO2: 22 mEq/L (ref 19–32)
Calcium: 9.7 mg/dL (ref 8.4–10.5)
Chloride: 108 mEq/L (ref 96–112)
Creat: 0.96 mg/dL (ref 0.50–1.10)
Glucose, Bld: 124 mg/dL — ABNORMAL HIGH (ref 70–99)

## 2012-12-28 ENCOUNTER — Encounter: Payer: Self-pay | Admitting: Family Medicine

## 2013-01-15 ENCOUNTER — Other Ambulatory Visit: Payer: Self-pay | Admitting: Gastroenterology

## 2014-01-12 ENCOUNTER — Ambulatory Visit (INDEPENDENT_AMBULATORY_CARE_PROVIDER_SITE_OTHER): Payer: BC Managed Care – PPO | Admitting: Family Medicine

## 2014-01-12 ENCOUNTER — Encounter: Payer: Self-pay | Admitting: Family Medicine

## 2014-01-12 ENCOUNTER — Other Ambulatory Visit: Payer: Self-pay | Admitting: Family Medicine

## 2014-01-12 VITALS — BP 130/92 | HR 65 | Temp 97.9°F | Resp 16 | Ht 62.0 in | Wt 233.6 lb

## 2014-01-12 DIAGNOSIS — J029 Acute pharyngitis, unspecified: Secondary | ICD-10-CM

## 2014-01-12 DIAGNOSIS — Z23 Encounter for immunization: Secondary | ICD-10-CM

## 2014-01-12 DIAGNOSIS — Z0000 Encounter for general adult medical examination without abnormal findings: Secondary | ICD-10-CM

## 2014-01-12 DIAGNOSIS — Z124 Encounter for screening for malignant neoplasm of cervix: Secondary | ICD-10-CM

## 2014-01-12 LAB — LIPID PANEL
Cholesterol: 179 mg/dL (ref 0–200)
HDL: 75 mg/dL (ref >39)
LDL CALC: 84 mg/dL (ref 0–99)
TRIGLYCERIDES: 99 mg/dL (ref <150)
Total CHOL/HDL Ratio: 2.4 Ratio
VLDL: 20 mg/dL (ref 0–40)

## 2014-01-12 LAB — CBC
HEMATOCRIT: 34.5 % — AB (ref 36.0–46.0)
Hemoglobin: 11 g/dL — ABNORMAL LOW (ref 12.0–15.0)
MCH: 25.5 pg — ABNORMAL LOW (ref 26.0–34.0)
MCHC: 31.9 g/dL (ref 30.0–36.0)
MCV: 80 fL (ref 78.0–100.0)
PLATELETS: 332 10*3/uL (ref 150–400)
RBC: 4.31 MIL/uL (ref 3.87–5.11)
RDW: 15.7 % — ABNORMAL HIGH (ref 11.5–15.5)
WBC: 8.1 10*3/uL (ref 4.0–10.5)

## 2014-01-12 LAB — COMPREHENSIVE METABOLIC PANEL
ALBUMIN: 4.5 g/dL (ref 3.5–5.2)
ALT: 17 U/L (ref 0–35)
AST: 16 U/L (ref 0–37)
Alkaline Phosphatase: 74 U/L (ref 39–117)
BUN: 14 mg/dL (ref 6–23)
CALCIUM: 9.5 mg/dL (ref 8.4–10.5)
CHLORIDE: 106 meq/L (ref 96–112)
CO2: 24 mEq/L (ref 19–32)
Creat: 0.9 mg/dL (ref 0.50–1.10)
Glucose, Bld: 98 mg/dL (ref 70–99)
POTASSIUM: 4.2 meq/L (ref 3.5–5.3)
Sodium: 139 mEq/L (ref 135–145)
Total Bilirubin: 0.3 mg/dL (ref 0.2–1.2)
Total Protein: 7.1 g/dL (ref 6.0–8.3)

## 2014-01-12 LAB — POCT RAPID STREP A (OFFICE): Rapid Strep A Screen: NEGATIVE

## 2014-01-12 LAB — TSH: TSH: 2.159 u[IU]/mL (ref 0.350–4.500)

## 2014-01-12 NOTE — Patient Instructions (Signed)
Please do give [facility] a call to schedule your mammogram.    For more information on [facility] imaging services, call us at [phone].  You do not appear to have strep throat today- let me know if your throat is not better soon!  I will be in touch with your labs asap

## 2014-01-12 NOTE — Progress Notes (Signed)
[facility] [address], [place] [address] [phone]  Date:  01/12/2014   Name:  Doris Green   DOB:  23-Apr-1956   MRN:  [id number]  PCP:  Melrose Nakayama, MD    Chief Complaint: Annual Exam   History of Present Illness:  Doris Green is a 58 y.o. very pleasant female patient who presents with the following:  Here today as a new patient for a CPE.  She has also been exposed to strep at work and notes an intermittent ST for the last week or so.  No fever.  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   She would like a pap today- her last was approx 3- 4 years ago.  She is not aware of any abnormal pap in the past.   Her last mammogram was about one year ago.  She has this done at [facility] and would like to return there for her follow-up.    She is fasting today.    She had a colonoscopy last year- she will have a repeat this year as she had an abnormal polyp last year.   She is s/p menopause.  No bleeding for several years.   No meds currently.   She thinks her last tetanus was in 2006.  She is not sure if she had the Tdap or Td.  This was not done at [facility] per her report  Declines a flu shot today   Patient Active Problem List   Diagnosis Date Noted  . Preventative health care 10/12/2011  . OBESITY 08/31/2008  . GEN OSTEOARTHROSIS INVOLVING MULTIPLE SITES 08/31/2008  . DEPRESSION, MAJOR, RECURRENT 12/13/2006   Wt Readings from Last 3 Encounters:  01/12/14 233 lb 9.6 oz (105.96 kg)  12/24/12 230 lb 8 oz (104.554 kg)  11/24/11 224 lb 4.8 oz (101.742 kg)     Past Medical History  Diagnosis Date  . UTERINE FIBROID 12/13/2006    No past surgical history on file.  History  Substance Use Topics  . Smoking status: Former Smoker    Types: Cigarettes    Quit date: 10/19/2011  . Smokeless tobacco: Never Used  . Alcohol Use: No    No family history on file.  Allergies  Allergen Reactions  . Ibuprofen     Blood in stool     Medication list has been reviewed and updated.  No current outpatient prescriptions on file prior to visit.   No current facility-administered medications on file prior to visit.    Review of Systems:  As per HPI- otherwise negative.   Physical Examination: Filed Vitals:   01/12/14 1000  BP: 130/92  Pulse: 65  Temp: 97.9 F (36.6 C)  Resp: 16   Filed Vitals:   01/12/14 1000  Height: 5\' 2"  (1.575 m)  Weight: 233 lb 9.6 oz (105.96 kg)   Body mass index is 42.72 kg/(m^2). Ideal Body Weight: Weight in (lb) to have BMI = 25: 136.4  GEN: WDWN, NAD, Non-toxic, A & O x 3, obese, looks well HEENT: Atraumatic, Normocephalic. Neck supple. No masses, No LAD.  Bilateral TM wnl, oropharynx normal.  PEERL,EOMI.   Ears and Nose: No external deformity. CV: RRR, No M/G/R. No JVD. No thrill. No extra heart sounds. PULM: CTA B, no wheezes, crackles, rhonchi. No retractions. No resp. distress. No accessory muscle use. ABD: S, NT, ND, +BS. No rebound. No HSM. EXTR: No c/c/e NEURO Normal gait.  PSYCH: Normally interactive. Conversant. Not  depressed or anxious appearing.  Calm demeanor.  Breast: normal exam without masses/ discharge/ dimpling Pelvic: normal exam.  No vaginal discharge or lesions, no CMT, no adnexal masses or tenderness   Results for orders placed in visit on 01/12/14  POCT RAPID STREP A (OFFICE)      Result Value Ref Range   Rapid Strep A Screen Negative  Negative     Assessment and Plan: Physical exam - Plan: CBC, Comprehensive metabolic panel, TSH, Lipid panel, Tdap vaccine greater than or equal to 7yo IM  Screening for cervical cancer - Plan: Pap IG, CT/NG w/ reflex HPV when ASC-U  Acute pharyngitis - Plan: POCT rapid strep A  Morbid obesity - Plan: TSH, Lipid panel  Labs pending as above.  Updated tdap.  Reassured that she does not appear to have strep today; she will let me know if not better soon- Sooner if worse.     Signed Lamar Blinks, MD

## 2014-01-13 LAB — PAP IG, CT-NG, RFX HPV ASCU
CHLAMYDIA PROBE AMP: NEGATIVE
GC Probe Amp: NEGATIVE

## 2014-01-14 ENCOUNTER — Encounter: Payer: Self-pay | Admitting: Family Medicine

## 2014-01-14 ENCOUNTER — Other Ambulatory Visit: Payer: Self-pay | Admitting: Family Medicine

## 2014-01-14 DIAGNOSIS — Z0000 Encounter for general adult medical examination without abnormal findings: Secondary | ICD-10-CM

## 2014-01-14 LAB — FERRITIN: Ferritin: 13 ng/mL (ref 10–291)

## 2014-01-15 ENCOUNTER — Encounter: Payer: Self-pay | Admitting: Family Medicine

## 2014-01-21 ENCOUNTER — Ambulatory Visit (HOSPITAL_COMMUNITY)
Admission: RE | Admit: 2014-01-21 | Discharge: 2014-01-21 | Disposition: A | Discharge status: Home / Self Care | Payer: BC Managed Care – PPO | Source: Ambulatory Visit | Attending: Family Medicine | Admitting: Family Medicine

## 2014-01-21 DIAGNOSIS — Z1231 Encounter for screening mammogram for malignant neoplasm of breast: Secondary | ICD-10-CM | POA: Insufficient documentation

## 2014-01-21 DIAGNOSIS — Z0000 Encounter for general adult medical examination without abnormal findings: Secondary | ICD-10-CM

## 2014-12-14 ENCOUNTER — Ambulatory Visit (INDEPENDENT_AMBULATORY_CARE_PROVIDER_SITE_OTHER): Payer: BC Managed Care – PPO | Admitting: Family Medicine

## 2014-12-14 VITALS — BP 130/90 | HR 66 | Temp 97.4°F | Resp 16 | Ht 62.75 in | Wt 225.4 lb

## 2014-12-14 DIAGNOSIS — R0981 Nasal congestion: Secondary | ICD-10-CM

## 2014-12-14 DIAGNOSIS — R42 Dizziness and giddiness: Secondary | ICD-10-CM | POA: Diagnosis not present

## 2014-12-14 DIAGNOSIS — N390 Urinary tract infection, site not specified: Secondary | ICD-10-CM | POA: Diagnosis not present

## 2014-12-14 LAB — POCT UA - MICROSCOPIC ONLY
Bacteria, U Microscopic: NEGATIVE
Casts, Ur, LPF, POC: NEGATIVE
Crystals, Ur, HPF, POC: NEGATIVE
Mucus, UA: NEGATIVE
Yeast, UA: NEGATIVE

## 2014-12-14 LAB — POCT URINALYSIS DIPSTICK
Bilirubin, UA: NEGATIVE
Glucose, UA: NEGATIVE
Ketones, UA: NEGATIVE
Nitrite, UA: NEGATIVE
Protein, UA: NEGATIVE
Spec Grav, UA: 1.025
Urobilinogen, UA: 0.2
pH, UA: 6

## 2014-12-14 LAB — POCT CBC
Granulocyte percent: 54.8 %G (ref 37–80)
HCT, POC: 40.3 % (ref 37.7–47.9)
Hemoglobin: 12.5 g/dL (ref 12.2–16.2)
Lymph, poc: 3.8 — AB (ref 0.6–3.4)
MCH, POC: 25.7 pg — AB (ref 27–31.2)
MCHC: 31 g/dL — AB (ref 31.8–35.4)
MCV: 82.8 fL (ref 80–97)
MID (cbc): 0.5 (ref 0–0.9)
MPV: 8.2 fL (ref 0–99.8)
POC Granulocyte: 5.2 (ref 2–6.9)
POC LYMPH PERCENT: 40 %L (ref 10–50)
POC MID %: 5.2 %M (ref 0–12)
Platelet Count, POC: 236 10*3/uL (ref 142–424)
RBC: 4.86 M/uL (ref 4.04–5.48)
RDW, POC: 16.1 %
WBC: 9.4 10*3/uL (ref 4.6–10.2)

## 2014-12-14 LAB — GLUCOSE, POCT (MANUAL RESULT ENTRY): POC Glucose: 88 mg/dl (ref 70–99)

## 2014-12-14 MED ORDER — CEPHALEXIN 500 MG PO CAPS: 500.0000 mg | ORAL_CAPSULE | Freq: Two times a day (BID) | ORAL | Status: DC

## 2014-12-14 NOTE — Patient Instructions (Signed)
Dizziness Dizziness is a common problem. It is a feeling of unsteadiness or light-headedness. You may feel like you are about to faint. Dizziness can lead to injury if you stumble or fall. A person of any age group can suffer from dizziness, but dizziness is more common in older adults. CAUSES  Dizziness can be caused by many different things, including:  Middle ear problems.  Standing for too long.  Infections.  An allergic reaction.  Aging.  An emotional response to something, such as the sight of blood.  Side effects of medicines.  Tiredness.  Problems with circulation or blood pressure.  Excessive use of alcohol or medicines, or illegal drug use.  Breathing too fast (hyperventilation).  An irregular heart rhythm (arrhythmia).  A low red blood cell count (anemia).  Pregnancy.  Vomiting, diarrhea, fever, or other illnesses that cause body fluid loss (dehydration).  Diseases or conditions such as Parkinson's disease, high blood pressure (hypertension), diabetes, and thyroid problems.  Exposure to extreme heat. DIAGNOSIS  Your health care provider will ask about your symptoms, perform a physical exam, and perform an electrocardiogram (ECG) to record the electrical activity of your heart. Your health care provider may also perform other heart or blood tests to determine the cause of your dizziness. These may include:  Transthoracic echocardiogram (TTE). During echocardiography, sound waves are used to evaluate how blood flows through your heart.  Transesophageal echocardiogram (TEE).  Cardiac monitoring. This allows your health care provider to monitor your heart rate and rhythm in real time.  Holter monitor. This is a portable device that records your heartbeat and can help diagnose heart arrhythmias. It allows your health care provider to track your heart activity for several days if needed.  Stress tests by exercise or by giving medicine that makes the heart beat  faster. TREATMENT  Treatment of dizziness depends on the cause of your symptoms and can vary greatly. HOME CARE INSTRUCTIONS   Drink enough fluids to keep your urine clear or pale yellow. This is especially important in very hot weather. In older adults, it is also important in cold weather.  Take your medicine exactly as directed if your dizziness is caused by medicines. When taking blood pressure medicines, it is especially important to get up slowly.  Rise slowly from chairs and steady yourself until you feel okay.  In the morning, first sit up on the side of the bed. When you feel okay, stand slowly while holding onto something until you know your balance is fine.  Move your legs often if you need to stand in one place for a long time. Tighten and relax your muscles in your legs while standing.  Have someone stay with you for 1-2 days if dizziness continues to be a problem. Do this until you feel you are well enough to stay alone. Have the person call your health care provider if he or she notices changes in you that are concerning.  Do not drive or use heavy machinery if you feel dizzy.  Do not drink alcohol. SEEK IMMEDIATE MEDICAL CARE IF:   Your dizziness or light-headedness gets worse.  You feel nauseous or vomit.  You have problems talking, walking, or using your arms, hands, or legs.  You feel weak.  You are not thinking clearly or you have trouble forming sentences. It may take a friend or family member to notice this.  You have chest pain, abdominal pain, shortness of breath, or sweating.  Your vision changes.  You notice   any bleeding. °· You have side effects from medicine that seems to be getting worse rather than better. °MAKE SURE YOU:  °· Understand these instructions. °· Will watch your condition. °· Will get help right away if you are not doing well or get worse. °Document Released: 03/28/2001 Document Revised: 10/07/2013 Document Reviewed: 04/21/2011 °ExitCare®  Patient Information ©2015 [employer]. This information is not intended to replace advice given to you by your health care provider. Make sure you discuss any questions you have with your health care provider. ° °Urinary Tract Infection °Urinary tract infections (UTIs) can develop anywhere along your urinary tract. Your urinary tract is your body's drainage system for removing wastes and extra water. Your urinary tract includes two kidneys, two ureters, a bladder, and a urethra. Your kidneys are a pair of bean-shaped organs. Each kidney is about the size of your fist. They are located below your ribs, one on each side of your spine. °CAUSES °Infections are caused by microbes, which are microscopic organisms, including fungi, viruses, and bacteria. These organisms are so small that they can only be seen through a microscope. Bacteria are the microbes that most commonly cause UTIs. °SYMPTOMS  °Symptoms of UTIs may vary by age and gender of the patient and by the location of the infection. Symptoms in young women typically include a frequent and intense urge to urinate and a painful, burning feeling in the bladder or urethra during urination. Older women and men are more likely to be tired, shaky, and weak and have muscle aches and abdominal pain. A fever may mean the infection is in your kidneys. Other symptoms of a kidney infection include pain in your back or sides below the ribs, nausea, and vomiting. °DIAGNOSIS °To diagnose a UTI, your caregiver will ask you about your symptoms. Your caregiver also will ask to provide a urine sample. The urine sample will be tested for bacteria and white blood cells. White blood cells are made by your body to help fight infection. °TREATMENT  °Typically, UTIs can be treated with medication. Because most UTIs are caused by a bacterial infection, they usually can be treated with the use of antibiotics. The choice of antibiotic and length of treatment depend on your symptoms and  the type of bacteria causing your infection. °HOME CARE INSTRUCTIONS °· If you were prescribed antibiotics, take them exactly as your caregiver instructs you. Finish the medication even if you feel better after you have only taken some of the medication. °· Drink enough water and fluids to keep your urine clear or pale yellow. °· Avoid caffeine, tea, and carbonated beverages. They tend to irritate your bladder. °· Empty your bladder often. Avoid holding urine for long periods of time. °· Empty your bladder before and after sexual intercourse. °· After a bowel movement, women should cleanse from front to back. Use each tissue only once. °SEEK MEDICAL CARE IF:  °· You have back pain. °· You develop a fever. °· Your symptoms do not begin to resolve within 3 days. °SEEK IMMEDIATE MEDICAL CARE IF:  °· You have severe back pain or lower abdominal pain. °· You develop chills. °· You have nausea or vomiting. °· You have continued burning or discomfort with urination. °MAKE SURE YOU:  °· Understand these instructions. °· Will watch your condition. °· Will get help right away if you are not doing well or get worse. °Document Released: 07/12/2005 Document Revised: 04/02/2012 Document Reviewed: 11/10/2011 °ExitCare® Patient Information ©2015 [employer]. This information is not   to replace advice given to you by your health care provider. Make sure you discuss any questions you have with your health care provider.  

## 2014-12-14 NOTE — Progress Notes (Signed)
Chief Complaint:  Chief Complaint  Patient presents with  . Dizziness    x 1 day    HPI: Doris Green is a 59 y.o. female who is here for dizzy since today, woke up and felt like she had ear problems, her ears feels like it is running all the time. She had some mucus drainage. She can't get it out. She feel sdizzy with certain ROM  Of her head. She denies any neuro sxs ie tinnitus, weakness, n/t, gait changes, HA, vision changes. She has had squeaking within her ears. Has not tried anythign for this.  She has not put anything in her ear, she has no radiation of pain, no fevers or chills ,  + congestion   Past Medical History  Diagnosis Date  . UTERINE FIBROID 12/13/2006   Past Surgical History  Procedure Laterality Date  . Shoulder surgery     History   Social History  . Marital Status: Single    Spouse Name: N/A  . Number of Children: 3  . Years of Education: GED   Occupational History  . Custodian    Social History Main Topics  . Smoking status: Former Smoker    Types: Cigarettes    Quit date: 10/19/2011  . Smokeless tobacco: Never Used  . Alcohol Use: No  . Drug Use: No  . Sexual Activity: Not on file   Other Topics Concern  . None   Social History Narrative   Family History  Problem Relation Age of Onset  . Diabetes Mother   . Hypertension Mother    Allergies  Allergen Reactions  . Ibuprofen     Blood in stool   Prior to Admission medications   Not on File     ROS: The patient denies fevers, chills, night sweats, unintentional weight loss, chest pain, palpitations, wheezing, dyspnea on exertion, nausea, vomiting, abdominal pain, dysuria, hematuria, melena, numbness, weakness, or tingling.   All other systems have been reviewed and were otherwise negative with the exception of those mentioned in the HPI and as above.    PHYSICAL EXAM: Filed Vitals:   12/14/14 1056  BP: 130/90  Pulse: 66  Temp: 97.4 F (36.3 C)  Resp: 16    Filed Vitals:   12/14/14 1056  Height: 5' 2.75" (1.594 m)  Weight: 225 lb 6.4 oz (102.241 kg)   Body mass index is 40.24 kg/(m^2).  General: Alert, no acute distress HEENT:  Normocephalic, atraumatic, oropharynx patent. EOMI, PERRLA + left TM dull  But without erythema or e/o infection, nl right  + nasal congestion , sinus tenderness Cardiovascular:  Regular rate and rhythm, no rubs murmurs or gallops.  No Carotid bruits, radial pulse intact. No pedal edema.  Respiratory: Clear to auscultation bilaterally.  No wheezes, rales, or rhonchi.  No cyanosis, no use of accessory musculature GI: No organomegaly, abdomen is soft and non-tender, positive bowel sounds.  No masses. Skin: No rashes. Neurologic: Facial musculature symmetric. CN 2-12 grossly normal , + dizziness with ROM in flexion and roation  Psychiatric: Patient is appropriate throughout our interaction. Lymphatic: No cervical lymphadenopathy Musculoskeletal: Gait intact.   LABS: Results for orders placed or performed in visit on 12/14/14  POCT CBC  Result Value Ref Range   WBC 9.4 4.6 - 10.2 K/uL   Lymph, poc 3.8 (A) 0.6 - 3.4   POC LYMPH PERCENT 40.0 10 - 50 %L   MID (cbc) 0.5 0 - 0.9   POC MID %  5.2 0 - 12 %M   POC Granulocyte 5.2 2 - 6.9   Granulocyte percent 54.8 37 - 80 %G   RBC 4.86 4.04 - 5.48 M/uL   Hemoglobin 12.5 12.2 - 16.2 g/dL   HCT, POC 40.3 37.7 - 47.9 %   MCV 82.8 80 - 97 fL   MCH, POC 25.7 (A) 27 - 31.2 pg   MCHC 31.0 (A) 31.8 - 35.4 g/dL   RDW, POC 16.1 %   Platelet Count, POC 236 142 - 424 K/uL   MPV 8.2 0 - 99.8 fL  POCT UA - Microscopic Only  Result Value Ref Range   WBC, Ur, HPF, POC 0-1    RBC, urine, microscopic 0-1    Bacteria, U Microscopic neg    Mucus, UA neg    Epithelial cells, urine per micros 0-1    Crystals, Ur, HPF, POC neg    Casts, Ur, LPF, POC neg    Yeast, UA neg   POCT urinalysis dipstick  Result Value Ref Range   Color, UA yellow    Clarity, UA clear    Glucose,  UA neg    Bilirubin, UA neg    Ketones, UA neg    Spec Grav, UA 1.025    Blood, UA small    pH, UA 6.0    Protein, UA neg    Urobilinogen, UA 0.2    Nitrite, UA neg    Leukocytes, UA Trace   POCT glucose (manual entry)  Result Value Ref Range   POC Glucose 88 70 - 99 mg/dl     EKG/XRAY:   Primary read interpreted by Dr. Marin Comment at [facility].   ASSESSMENT/PLAN: Encounter Diagnoses  Name Primary?  . Dizziness and giddiness Yes  . Nasal congestion   . UTI (urinary tract infection), uncomplicated    ? Dizziness related to nasal congestion and inner ear combined with UTI Labs pending CMP, urine cx Rx Kefllex Push fluids Fu prn   Gross sideeffects, risk and benefits, and alternatives of medications d/w patient. Patient is aware that all medications have potential sideeffects and we are unable to predict every sideeffect or drug-drug interaction that may occur.  Eimi Viney, [place], DO 12/14/2014 2:23 PM

## 2014-12-15 LAB — COMPLETE METABOLIC PANEL WITHOUT GFR
ALT: 13 U/L (ref 0–35)
AST: 13 U/L (ref 0–37)
Albumin: 4.5 g/dL (ref 3.5–5.2)
Alkaline Phosphatase: 72 U/L (ref 39–117)
GFR, Est Non African American: 75 mL/min
Glucose, Bld: 82 mg/dL (ref 70–99)
Potassium: 4.5 meq/L (ref 3.5–5.3)
Sodium: 141 meq/L (ref 135–145)
Total Bilirubin: 0.3 mg/dL (ref 0.2–1.2)
Total Protein: 7.1 g/dL (ref 6.0–8.3)

## 2014-12-15 LAB — COMPLETE METABOLIC PANEL WITH GFR
BUN: 15 mg/dL (ref 6–23)
CO2: 25 mEq/L (ref 19–32)
Calcium: 9.9 mg/dL (ref 8.4–10.5)
Chloride: 107 mEq/L (ref 96–112)
Creat: 0.86 mg/dL (ref 0.50–1.10)
GFR, Est African American: 86 mL/min

## 2014-12-16 LAB — URINE CULTURE
Colony Count: NO GROWTH
Organism ID, Bacteria: NO GROWTH

## 2014-12-27 ENCOUNTER — Encounter: Payer: Self-pay | Admitting: Family Medicine

## 2015-01-05 ENCOUNTER — Telehealth: Payer: Self-pay

## 2015-01-05 NOTE — Telephone Encounter (Signed)
Pt called about labs. Received them in the mail and wanted to know what everything was and what it meant. Explained to pt's understanding.

## 2015-03-11 ENCOUNTER — Other Ambulatory Visit: Payer: Self-pay | Admitting: Family Medicine

## 2015-03-11 DIAGNOSIS — Z1231 Encounter for screening mammogram for malignant neoplasm of breast: Secondary | ICD-10-CM

## 2015-03-22 ENCOUNTER — Ambulatory Visit (HOSPITAL_COMMUNITY): Payer: BC Managed Care – PPO

## 2015-03-23 ENCOUNTER — Ambulatory Visit (HOSPITAL_COMMUNITY)
Admission: RE | Admit: 2015-03-23 | Discharge: 2015-03-23 | Disposition: A | Discharge status: Home / Self Care | Payer: BC Managed Care – PPO | Source: Ambulatory Visit | Attending: Family Medicine | Admitting: Family Medicine

## 2015-03-23 DIAGNOSIS — Z1231 Encounter for screening mammogram for malignant neoplasm of breast: Secondary | ICD-10-CM | POA: Insufficient documentation

## 2015-08-24 ENCOUNTER — Other Ambulatory Visit: Payer: Self-pay | Admitting: Urgent Care

## 2015-08-24 ENCOUNTER — Ambulatory Visit (INDEPENDENT_AMBULATORY_CARE_PROVIDER_SITE_OTHER): Payer: BC Managed Care – PPO

## 2015-08-24 ENCOUNTER — Ambulatory Visit (INDEPENDENT_AMBULATORY_CARE_PROVIDER_SITE_OTHER): Payer: BC Managed Care – PPO | Admitting: Urgent Care

## 2015-08-24 VITALS — BP 130/84 | HR 62 | Temp 97.5°F | Resp 16 | Ht 62.5 in | Wt 231.0 lb

## 2015-08-24 DIAGNOSIS — M25562 Pain in left knee: Secondary | ICD-10-CM

## 2015-08-24 MED ORDER — DICLOFENAC SODIUM 3 % EX CREA: 1.0000 "application " | TOPICAL_CREAM | Freq: Two times a day (BID) | CUTANEOUS | Status: DC

## 2015-08-24 NOTE — Progress Notes (Signed)
    MRN: [id number] DOB: 01/18/1956  Subjective:   Doris Green is a 59 y.o. female presenting for chief complaint of Knee Pain  Reports ~1 month history of worsening left knee pain. Also has knee buckling, popping, mild swelling. 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 Her pain has been particularly worse at night after her shifts. Has always worked as a Immunologist. Has tried Alleve without any relief. Denies fever, trauma, numbness or tingling, redness. Patient had an x-ray of her right knee 2010, showed degenerative changes. Denies any other aggravating or relieving factors, no other questions or concerns.  Doris Green has a current medication list which includes the following prescription(s): naproxen sodium and cephalexin. Also is allergic to ibuprofen.  Doris Green  has a past medical history of UTERINE FIBROID (12/13/2006). Also  has past surgical history that includes Shoulder surgery.  Objective:   Vitals: BP 130/84 mmHg  Pulse 62  Temp(Src) 97.5 F (36.4 C) (Oral)  Resp 16  Ht 5' 2.5" (1.588 m)  Wt 231 lb (104.781 kg)  BMI 41.55 kg/m2  SpO2   Physical Exam  Constitutional: She is oriented to person, place, and time. She appears well-developed and well-nourished.  Body habitus is morbidly obese.  Cardiovascular: Normal rate.   Pulmonary/Chest: Effort normal.  Musculoskeletal:       Left knee: She exhibits swelling (trace edema medially). She exhibits normal range of motion, no effusion, no ecchymosis, no deformity, no laceration, no erythema, normal alignment, no LCL laxity, normal patellar mobility and no bony tenderness. Tenderness found. Medial joint line tenderness noted. No lateral joint line, no MCL, no LCL and no patellar tendon tenderness noted.  Neurological: She is alert and oriented to person, place, and time. She has normal reflexes. Coordination normal.  Skin: Skin is warm and dry. No rash noted. No erythema. No pallor.  Psychiatric: She has a normal mood  and affect.   [facility] reading by PA-Mikyle Sox. Left knee - mild space narrowing medially, degenerative changes over patella as seen on sunrise view.  Dg Knee Ap/lat W/sunrise Left  08/24/2015  CLINICAL DATA:  Left knee pain.  No known injury. EXAM: LEFT KNEE 3 VIEWS COMPARISON:  None. FINDINGS: Slight joint space narrowing in all 3 compartments. No significant spurring. No joint effusion. No acute bony abnormality. Specifically, no fracture, subluxation, or dislocation. Soft tissues are intact. IMPRESSION: Early joint space narrowing in all 3 compartments. No acute bony abnormality. Electronically Signed   By: Rolm Baptise M.D.   On: 08/24/2015 10:56   Assessment and Plan :   1. Left knee pain - Likely due to degenerative changes, start diclofenac topically twice daily. If no improvement consider PT or referral to ortho for further work up in 2 weeks.  2. Morbid obesity, unspecified obesity type ([place]) - Counseled on dietary modifications, start knee friendly exercise such as yoga, bicycle, elliptical. Patient agreed.   Jaynee Eagles, PA-C [facility] [phone] 08/24/2015 10:18 AM

## 2015-08-24 NOTE — Patient Instructions (Signed)

## 2016-02-22 ENCOUNTER — Other Ambulatory Visit: Payer: Self-pay

## 2016-02-22 DIAGNOSIS — Z1231 Encounter for screening mammogram for malignant neoplasm of breast: Secondary | ICD-10-CM

## 2016-03-09 ENCOUNTER — Ambulatory Visit (INDEPENDENT_AMBULATORY_CARE_PROVIDER_SITE_OTHER): Payer: BC Managed Care – PPO | Admitting: Urgent Care

## 2016-03-09 VITALS — BP 136/84 | HR 68 | Temp 97.8°F | Resp 16 | Ht 62.0 in | Wt 222.0 lb

## 2016-03-09 DIAGNOSIS — W57XXXA Bitten or stung by nonvenomous insect and other nonvenomous arthropods, initial encounter: Secondary | ICD-10-CM | POA: Diagnosis not present

## 2016-03-09 DIAGNOSIS — L259 Unspecified contact dermatitis, unspecified cause: Secondary | ICD-10-CM | POA: Diagnosis not present

## 2016-03-09 DIAGNOSIS — L299 Pruritus, unspecified: Secondary | ICD-10-CM

## 2016-03-09 DIAGNOSIS — S60561A Insect bite (nonvenomous) of right hand, initial encounter: Secondary | ICD-10-CM | POA: Diagnosis not present

## 2016-03-09 MED ORDER — RANITIDINE HCL 150 MG PO TABS: 150.0000 mg | ORAL_TABLET | Freq: Two times a day (BID) | ORAL | Status: DC

## 2016-03-09 MED ORDER — CEPHALEXIN 500 MG PO CAPS: 500.0000 mg | ORAL_CAPSULE | Freq: Three times a day (TID) | ORAL | Status: DC

## 2016-03-09 MED ORDER — CETIRIZINE HCL 10 MG PO TABS: 10.0000 mg | ORAL_TABLET | Freq: Every day | ORAL | Status: DC

## 2016-03-09 NOTE — Progress Notes (Signed)
    MRN: [id number] DOB: 1956-08-28  Subjective:   Doris Green is a 60 y.o. female presenting for chief complaint of Insect Bite  Reports suffering an insect bite to her right hand 1 day ago, believes it was an ant. Has since had redness, itchiness, swelling of the dorsal aspect of her right hand. Denies fever, streaking, numbness or tingling, pain, cough. She has previously had a spider bite with subsequent infection and is worried that this might be the same case.  Doris Green currently has no medications in their medication list. Also is allergic to ibuprofen.  Doris Green  has a past medical history of UTERINE FIBROID (12/13/2006). Also  has past surgical history that includes Shoulder surgery.  Objective:   Vitals: BP 136/84 mmHg  Pulse 68  Temp(Src) 97.8 F (36.6 C)  Resp 16  Ht 5\' 2"  (1.575 m)  Wt 222 lb (100.699 kg)  BMI 40.59 kg/m2  SpO2 100%  Physical Exam  Constitutional: She is oriented to person, place, and time. She appears well-developed and well-nourished.  Cardiovascular: Normal rate.   Pulmonary/Chest: Effort normal.  Musculoskeletal:       Right hand: She exhibits swelling (over area depicted with associated erythema with area). She exhibits normal range of motion, no tenderness, no bony tenderness, normal capillary refill, no deformity and no laceration. Normal sensation noted. Normal strength noted.       Hands: Neurological: She is alert and oriented to person, place, and time.  Skin: Skin is warm and dry.   Assessment and Plan :   1. Contact dermatitis 2. Insect bite 3. Itching - Start oral antihistamines for contact dermatitis and Keflex to address possible infection. RTC in 1 week if no improvement.  Jaynee Eagles, PA-C [facility] [phone] 03/09/2016 1:37 PM

## 2016-03-09 NOTE — Patient Instructions (Addendum)
Contact Dermatitis Dermatitis is redness, soreness, and swelling (inflammation) of the skin. Contact dermatitis is a reaction to certain substances that touch the skin. There are two types of contact dermatitis:   Irritant contact dermatitis. This type is caused by something that irritates your skin, such as dry hands from washing them too much. This type does not require previous exposure to the substance for a reaction to occur. This type is more common.  Allergic contact dermatitis. This type is caused by a substance that you are allergic to, such as a nickel allergy or poison ivy. This type only occurs if you have been exposed to the substance (allergen) before. Upon a repeat exposure, your body reacts to the substance. This type is less common. CAUSES  Many different substances can cause contact dermatitis. Irritant contact dermatitis is most commonly caused by exposure to:   Makeup.   Soaps.   Detergents.   Bleaches.   Acids.   Metal salts, such as nickel.  Allergic contact dermatitis is most commonly caused by exposure to:   Poisonous plants.   Chemicals.   Jewelry.   Latex.   Medicines.   Preservatives in products, such as clothing.  RISK FACTORS This condition is more likely to develop in:   People who have jobs that expose them to irritants or allergens.  People who have certain medical conditions, such as asthma or eczema.  SYMPTOMS  Symptoms of this condition may occur anywhere on your body where the irritant has touched you or is touched by you. Symptoms include:  Dryness or flaking.   Redness.   Cracks.   Itching.   Pain or a burning feeling.   Blisters.  Drainage of small amounts of blood or clear fluid from skin cracks. With allergic contact dermatitis, there may also be swelling in areas such as the eyelids, mouth, or genitals.  DIAGNOSIS  This condition is diagnosed with a medical history and physical exam. A patch skin test  may be performed to help determine the cause. If the condition is related to your job, you may need to see an occupational medicine specialist. TREATMENT Treatment for this condition includes figuring out what caused the reaction and protecting your skin from further contact. Treatment may also include:   Steroid creams or ointments. Oral steroid medicines may be needed in more severe cases.  Antibiotics or antibacterial ointments, if a skin infection is present.  Antihistamine lotion or an antihistamine taken by mouth to ease itching.  A bandage (dressing). HOME CARE INSTRUCTIONS Skin Care  Moisturize your skin as needed.   Apply cool compresses to the affected areas.  Try taking a bath with:  Epsom salts. Follow the instructions on the packaging. You can get these at your local pharmacy or grocery store.  Baking soda. Pour a small amount into the bath as directed by your health care provider.  Colloidal oatmeal. Follow the instructions on the packaging. You can get this at your local pharmacy or grocery store.  Try applying baking soda paste to your skin. Stir water into baking soda until it reaches a paste-like consistency.  Do not scratch your skin.  Bathe less frequently, such as every other day.  Bathe in lukewarm water. Avoid using hot water. Medicines  Take or apply over-the-counter and prescription medicines only as told by your health care provider.   If you were prescribed an antibiotic medicine, take or apply your antibiotic as told by your health care provider. Do not stop using the   antibiotic even if your condition starts to improve. General Instructions  Keep all follow-up visits as told by your health care provider. This is important.  Avoid the substance that caused your reaction. If you do not know what caused it, keep a journal to try to track what caused it. Write down:  What you eat.  What cosmetic products you use.  What you drink.  What  you wear in the affected area. This includes jewelry.  If you were given a dressing, take care of it as told by your health care provider. This includes when to change and remove it. SEEK MEDICAL CARE IF:   Your condition does not improve with treatment.  Your condition gets worse.  You have signs of infection such as swelling, tenderness, redness, soreness, or warmth in the affected area.  You have a fever.  You have new symptoms. SEEK IMMEDIATE MEDICAL CARE IF:   You have a severe headache, neck pain, or neck stiffness.  You vomit.  You feel very sleepy.  You notice red streaks coming from the affected area.  Your bone or joint underneath the affected area becomes painful after the skin has healed.  The affected area turns darker.  You have difficulty breathing.   This information is not intended to replace advice given to you by your health care provider. Make sure you discuss any questions you have with your health care provider.   Document Released: 09/29/2000 Document Revised: 06/23/2015 Document Reviewed: 02/17/2015 Elsevier Interactive Patient Education 2016 [employer].     IF you received an x-ray today, you will receive an invoice from [facility]. Please contact [facility] at [phone] with questions or concerns regarding your invoice.   IF you received labwork today, you will receive an invoice from [employer]. Please contact [facility] at [phone] with questions or concerns regarding your invoice.   Our billing staff will not be able to assist you with questions regarding bills from these companies.  You will be contacted with the lab results as soon as they are available. The fastest way to get your results is to activate your My Chart account. Instructions are located on the last page of this paperwork. If you have not heard from [place] regarding the results in 2 weeks, please contact this office.

## 2016-03-24 ENCOUNTER — Ambulatory Visit: Payer: Self-pay

## 2016-04-03 ENCOUNTER — Ambulatory Visit
Admission: RE | Admit: 2016-04-03 | Discharge: 2016-04-03 | Disposition: A | Discharge status: Home / Self Care | Payer: BC Managed Care – PPO | Source: Ambulatory Visit

## 2016-04-03 DIAGNOSIS — Z1231 Encounter for screening mammogram for malignant neoplasm of breast: Secondary | ICD-10-CM

## 2016-04-06 ENCOUNTER — Other Ambulatory Visit: Payer: Self-pay | Admitting: Family Medicine

## 2016-04-06 DIAGNOSIS — R928 Other abnormal and inconclusive findings on diagnostic imaging of breast: Secondary | ICD-10-CM

## 2016-04-12 ENCOUNTER — Ambulatory Visit
Admission: RE | Admit: 2016-04-12 | Discharge: 2016-04-12 | Disposition: A | Discharge status: Home / Self Care | Payer: BC Managed Care – PPO | Source: Ambulatory Visit | Attending: Family Medicine | Admitting: Family Medicine

## 2016-04-12 DIAGNOSIS — R928 Other abnormal and inconclusive findings on diagnostic imaging of breast: Secondary | ICD-10-CM

## 2016-04-26 ENCOUNTER — Encounter: Payer: BC Managed Care – PPO | Admitting: Family Medicine

## 2016-04-26 NOTE — Patient Instructions (Signed)
     IF you received an x-ray today, you will receive an invoice from [facility]. Please contact [facility] at [phone] with questions or concerns regarding your invoice.   IF you received labwork today, you will receive an invoice from [facility]. Please contact [facility] at [phone] with questions or concerns regarding your invoice.   Our billing staff will not be able to assist you with questions regarding bills from these companies.  You will be contacted with the lab results as soon as they are available. The fastest way to get your results is to activate your My Chart account. Instructions are located on the last page of this paperwork. If you have not heard from us regarding the results in 2 weeks, please contact this office.      

## 2016-04-29 NOTE — Progress Notes (Signed)
This encounter was created in error - please disregard.  This encounter was created in error - please disregard.

## 2016-05-16 ENCOUNTER — Telehealth: Payer: Self-pay

## 2016-05-16 ENCOUNTER — Ambulatory Visit (INDEPENDENT_AMBULATORY_CARE_PROVIDER_SITE_OTHER): Payer: BC Managed Care – PPO

## 2016-05-16 ENCOUNTER — Ambulatory Visit (INDEPENDENT_AMBULATORY_CARE_PROVIDER_SITE_OTHER): Payer: BC Managed Care – PPO | Admitting: Family Medicine

## 2016-05-16 VITALS — BP 128/80 | HR 85 | Temp 98.0°F | Resp 17 | Ht 62.0 in | Wt 225.0 lb

## 2016-05-16 DIAGNOSIS — F329 Major depressive disorder, single episode, unspecified: Secondary | ICD-10-CM

## 2016-05-16 DIAGNOSIS — M25569 Pain in unspecified knee: Secondary | ICD-10-CM | POA: Diagnosis not present

## 2016-05-16 DIAGNOSIS — R208 Other disturbances of skin sensation: Secondary | ICD-10-CM

## 2016-05-16 DIAGNOSIS — Z113 Encounter for screening for infections with a predominantly sexual mode of transmission: Secondary | ICD-10-CM | POA: Diagnosis not present

## 2016-05-16 DIAGNOSIS — Z13820 Encounter for screening for osteoporosis: Secondary | ICD-10-CM

## 2016-05-16 DIAGNOSIS — Z01419 Encounter for gynecological examination (general) (routine) without abnormal findings: Secondary | ICD-10-CM

## 2016-05-16 DIAGNOSIS — Z1211 Encounter for screening for malignant neoplasm of colon: Secondary | ICD-10-CM

## 2016-05-16 DIAGNOSIS — Z136 Encounter for screening for cardiovascular disorders: Secondary | ICD-10-CM

## 2016-05-16 DIAGNOSIS — R9431 Abnormal electrocardiogram [ECG] [EKG]: Secondary | ICD-10-CM

## 2016-05-16 DIAGNOSIS — Z13220 Encounter for screening for lipoid disorders: Secondary | ICD-10-CM

## 2016-05-16 DIAGNOSIS — Z130 Encounter for screening for diseases of the blood and blood-forming organs and certain disorders involving the immune mechanism: Secondary | ICD-10-CM

## 2016-05-16 DIAGNOSIS — R5383 Other fatigue: Secondary | ICD-10-CM

## 2016-05-16 DIAGNOSIS — G8929 Other chronic pain: Secondary | ICD-10-CM

## 2016-05-16 DIAGNOSIS — Z0000 Encounter for general adult medical examination without abnormal findings: Secondary | ICD-10-CM | POA: Diagnosis not present

## 2016-05-16 DIAGNOSIS — F32A Depression, unspecified: Secondary | ICD-10-CM

## 2016-05-16 LAB — CBC WITH DIFFERENTIAL/PLATELET
Basophils Absolute: 0 cells/uL (ref 0–200)
Basophils Relative: 0 %
EOS PCT: 1 %
Eosinophils Absolute: 103 cells/uL (ref 15–500)
HCT: 39 % (ref 35.0–45.0)
HEMOGLOBIN: 12 g/dL (ref 11.7–15.5)
LYMPHS ABS: 3502 {cells}/uL (ref 850–3900)
Lymphocytes Relative: 34 %
MCH: 25.4 pg — ABNORMAL LOW (ref 27.0–33.0)
MCHC: 30.8 g/dL — AB (ref 32.0–36.0)
MCV: 82.5 fL (ref 80.0–100.0)
MPV: 11.1 fL (ref 7.5–12.5)
Monocytes Absolute: 515 cells/uL (ref 200–950)
Monocytes Relative: 5 %
NEUTROS ABS: 6180 {cells}/uL (ref 1500–7800)
Neutrophils Relative %: 60 %
Platelets: 279 10*3/uL (ref 140–400)
RBC: 4.73 MIL/uL (ref 3.80–5.10)
RDW: 14.8 % (ref 11.0–15.0)
WBC: 10.3 10*3/uL (ref 3.8–10.8)

## 2016-05-16 LAB — TSH: TSH: 1.8 mIU/L

## 2016-05-16 LAB — POC MICROSCOPIC URINALYSIS (UMFC): MUCUS RE: ABSENT

## 2016-05-16 LAB — POCT URINALYSIS DIP (MANUAL ENTRY)
BILIRUBIN UA: NEGATIVE
BILIRUBIN UA: NEGATIVE
Glucose, UA: NEGATIVE
Nitrite, UA: NEGATIVE
PH UA: 5
Protein Ur, POC: NEGATIVE
Spec Grav, UA: 1.025
Urobilinogen, UA: 0.2

## 2016-05-16 MED ORDER — GABAPENTIN 300 MG PO CAPS: 300.0000 mg | ORAL_CAPSULE | Freq: Two times a day (BID) | ORAL | 0 refills | Status: DC

## 2016-05-16 NOTE — Progress Notes (Signed)
Patient ID: Doris Green, female    DOB: 01-16-1956, 60 y.o.   MRN: [id number]  PCP: Molli Barrows, FNP  Chief Complaint  Patient presents with  . Annual Exam    Subjective:   HPI Presents for an annual physical exam.   Reports regular walking for exercise although this has recently decreased due to knee pain. She does not routinely check blood pressure at home. Former smoker. Regularly receives dental exams.  Need to schedule an eye exam.  Concerns patient would like to address today include:  She is not sexually active but would like to screen hep C and HIV.  Overdue colonoscopy. She reports that during her first colonoscopy, she had to have a polyp removed and was advised to follow-up, never did.  She is ready to schedule colonoscopy today.  Chronic long-standing bilateral knee pain- pain in both knees occurs daily for many years.  Appears to be worsening.  Her occupation requires her to stand for prolong hours daily. She really doesn't want to take any additional medications.  Requesting diabetes screening today She has noticed an increase in evening hunger.  States she "wakes-up in the middle of night to eat". Her diet is free of red meat, and reports eating lots of vegetables and fruits. She is also experiencing some worsening night-time foot burning and numbness which increases her concern about diabetes.  Insomnia with fatigue-patient reports progressive difficulty falling asleep. Upon falling asleep, she wakes up after 10-15 minutes and struggles to fall back to sleep. She doesn't take any medication for sleep.  Reports depression as stable, although occasional suffers from unprovoked crying spells.  Denies suicidal ideation. Depressive symptoms do not impact her ability to function or attend work.She does not wish to take any medication. Reports management with exercise and self talk works for management of her symptoms.   Review of Systems    Constitutional: Positive for appetite change and fever.  Respiratory: Negative.   Cardiovascular: Negative.   Endocrine: Negative.   Musculoskeletal: Positive for arthralgias.  Skin: Negative.   Allergic/Immunologic: Negative.   Neurological: Negative.   Psychiatric/Behavioral: Positive for sleep disturbance.       See HPI       Patient Active Problem List   Diagnosis Date Noted  . Preventative health care 10/12/2011  . OBESITY 08/31/2008  . GEN OSTEOARTHROSIS INVOLVING MULTIPLE SITES 08/31/2008  . DEPRESSION, MAJOR, RECURRENT 12/13/2006     Prior to Admission medications   Medication Sig Start Date End Date Taking? Authorizing Provider  ranitidine (ZANTAC) 150 MG tablet Take 1 tablet (150 mg total) by mouth 2 (two) times daily. 03/09/16  Yes Jaynee Eagles, PA-C  cetirizine (ZYRTEC) 10 MG tablet Take 1 tablet (10 mg total) by mouth daily. Patient not taking: Reported on 05/16/2016 03/09/16   Jaynee Eagles, PA-C     Allergies  Allergen Reactions  . Ibuprofen     Blood in stool       Objective:  Physical Exam  Constitutional: She is oriented to person, place, and time. She appears well-developed and well-nourished.  HENT:  Head: Normocephalic and atraumatic.  Right Ear: External ear normal.  Left Ear: External ear normal.  Eyes: Conjunctivae are normal. Pupils are equal, round, and reactive to light.  Neck: Normal range of motion.  Cardiovascular: Normal rate, regular rhythm, normal heart sounds and intact distal pulses.   Pulmonary/Chest: Effort normal and breath sounds normal.  Abdominal: Soft. Bowel sounds are normal.  Genitourinary: Vagina normal.  Genitourinary  Comments: Breasts are symmetric without cutaneous changes, nipple inversion or discharge. No masses or tenderness, and no axillary lymphadenopathy.  Normal female external genitalia without lesion. No inguinal lymphadenopathy. Vaginal mucosa is pink and moist without lesions. Cervix is closed without discharge,  not friable. Pap smear obtained. No cervical motion tenderness, adnexal fullness or tenderness.  Musculoskeletal: Normal range of motion.  Knee tenderness with palpation Mild knee edema without pitting.  Neurological: She is alert and oriented to person, place, and time.  Skin: Skin is warm and dry. No rash noted.    .Dg Knee Complete 4 Views Left  Result Date: 05/16/2016 CLINICAL DATA:  Chronic knee pain for greater than 6 months EXAM: LEFT KNEE - COMPLETE 4+ VIEW COMPARISON:  08/24/2015 FINDINGS: Early medial and patellofemoral compartment marginal spurring. No convincing joint space narrowing today. No fracture deformity, subluxation, or joint effusion. IMPRESSION: Early degenerative spurring.  Stable when compared to 08/24/2015. Electronically Signed   By: Monte Fantasia M.D.   On: 05/16/2016 15:17   Dg Knee Complete 4 Views Right  Result Date: 05/16/2016 CLINICAL DATA:  Chronic knee pain EXAM: RIGHT KNEE - COMPLETE 4+ VIEW COMPARISON:  None. FINDINGS: Degenerative spurring greatest about the medial and patellofemoral compartments. Medial spurring is greatest and is associated with at least moderate joint narrowing. No effusion, fracture, or subluxation. 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 Electronically Signed   By: Monte Fantasia M.D.   On: 05/16/2016 15:19   Assessment & Plan:  .1. Annual physical exam Age Appropriate Anticipatory Guidance provided.  2. Screening for STD (sexually transmitted disease) - HIV antibody (with reflex) - Hepatitis C antibody  3. Well female exam with routine gynecological exam - Pap IG and HPV (high risk) DNA detection Consider repeat at age 32 if findings are normal.  4. Other fatigue - TSH  - POCT Microscopic Urinalysis ([facility]) - POCT urinalysis dipstick - COMPLETE METABOLIC PANEL WITH GFR Labs values within normal range   - CBC with Differential/Platelet-low MCV and MCHC, ordered a  Ferritin level to evaluate anemia  5. Encounter for lipid screening for cardiovascular disease - Lipid panel-Within normal range Recheck lipids in 12 months - EKG 12-Lead-abnormal, nonspecific T-wave  Patient has a strong familiar histroy of cardiovascular disease with her Mother dying from an MI at age 53 and her sister dying from an MI mid 22's. Consider referral to cardiology if lipids are uncontrolled and if patient results indicate hyperglycemia  6. Chronic knee pain, unspecified laterality - DG Knee Complete 4 Views Left; Future - DG Knee Complete 4 Views Right; Future -Referral to orthopedics for evaluation of chronic bilateral knee pain resulting from osteoarthritis pain.  7. Depression - TSH-normal At present, patient reports crying episodes occur occasionally. She reports this occurring less than once every 1-2 months and does not wish to be treated medically. No thoughts or intentions of suicide.  8. Encounter for screening for osteoporosis - DG [facility] [place]. Kenton Kingfisher, MSN, FNP-C [facility]

## 2016-05-16 NOTE — Patient Instructions (Addendum)
I will follow-up with you regarding your labs.  Schedule mammogram in June 2017 Gastroenterology will contact to schedule your colonoscopy  Orthopedics will call and schedule appointnment Bone Density test ordered    IF you received an x-ray today, you will receive an invoice from [facility]. Please contact [facility] at [phone] with questions or concerns regarding your invoice.   IF you received labwork today, you will receive an invoice from [employer]. Please contact [facility] at [phone] with questions or concerns regarding your invoice.   Our billing staff will not be able to assist you with questions regarding bills from these companies.  You will be contacted with the lab results as soon as they are available. The fastest way to get your results is to activate your My Chart account. Instructions are located on the last page of this paperwork. If you have not heard from [place] regarding the results in 2 weeks, please contact this office.      Exercising to Stay Healthy Exercising regularly is important. It has many health benefits, such as:  Improving your overall fitness, flexibility, and endurance.  Increasing your bone density.  Helping with weight control.  Decreasing your body fat.  Increasing your muscle strength.  Reducing stress and tension.  Improving your overall health. In order to become healthy and stay healthy, it is recommended that you do moderate-intensity and vigorous-intensity exercise. You can tell that you are exercising at a moderate intensity if you have a higher heart rate and faster breathing, but you are still able to hold a conversation. You can tell that you are exercising at a vigorous intensity if you are breathing much harder and faster and cannot hold a conversation while exercising. HOW OFTEN SHOULD I EXERCISE? Choose an activity that you enjoy and set realistic goals. Your health care  provider can help you to make an activity plan that works for you. Exercise regularly as directed by your health care provider. This may include:   Doing resistance training twice each week, such as:  Push-ups.  Sit-ups.  Lifting weights.  Using resistance bands.  Doing a given intensity of exercise for a given amount of time. Choose from these options:  150 minutes of moderate-intensity exercise every week.  75 minutes of vigorous-intensity exercise every week.  A mix of moderate-intensity and vigorous-intensity exercise every week. Children, pregnant women, people who are out of shape, people who are overweight, and older adults may need to consult a health care provider for individual recommendations. If you have any sort of medical condition, be sure to consult your health care provider before starting a new exercise program.  WHAT ARE SOME EXERCISE IDEAS? Some moderate-intensity exercise ideas include:   Walking at a rate of 1 mile in 15 minutes.  Biking.  Hiking.  Golfing.  Dancing. Some vigorous-intensity exercise ideas include:   Walking at a rate of at least 4.5 miles per hour.  Jogging or running at a rate of 5 miles per hour.  Biking at a rate of at least 10 miles per hour.  Lap swimming.  Roller-skating or in-line skating.  Cross-country skiing.  Vigorous competitive sports, such as football, basketball, and soccer.  Jumping rope.  Aerobic dancing. WHAT ARE SOME EVERYDAY ACTIVITIES THAT CAN HELP ME TO GET EXERCISE?  Yard work, such as:  Psychologist, educational.  Raking and bagging leaves.  Washing and waxing your car.  Pushing a stroller.  Shoveling snow.  Gardening.  Washing windows  or floors. HOW CAN I BE MORE ACTIVE IN MY DAY-TO-DAY ACTIVITIES?  Use the stairs instead of the elevator.  Take a walk during your lunch break.  If you drive, park your car farther away from work or school.  If you take public transportation, get off  one stop early and walk the rest of the way.  Make all of your phone calls while standing up and walking around.  Get up, stretch, and walk around every 30 minutes throughout the day. WHAT GUIDELINES SHOULD I FOLLOW WHILE EXERCISING?  Do not exercise so much that you hurt yourself, feel dizzy, or get very short of breath.  Consult your health care provider before starting a new exercise program.  Wear comfortable clothes and shoes with good support.  Drink plenty of water while you exercise to prevent dehydration or heat stroke. Body water is lost during exercise and must be replaced.  Work out until you breathe faster and your heart beats faster.   This information is not intended to replace advice given to you by your health care provider. Make sure you discuss any questions you have with your health care provider.   Document Released: 11/04/2010 Document Revised: 10/23/2014 Document Reviewed: 03/05/2014 Elsevier Interactive Patient Education 2016 [place]. Heart-Healthy Eating Plan Many factors influence your heart health, including eating and exercise habits. Heart (coronary) risk increases with abnormal blood fat (lipid) levels. Heart-healthy meal planning includes limiting unhealthy fats, increasing healthy fats, and making other small dietary changes. This includes maintaining a healthy body weight to help keep lipid levels within a normal range. WHAT IS MY PLAN?  Your health care provider recommends that you:  Get no more than _________% of the total calories in your daily diet from fat.  Limit your intake of saturated fat to less than _________% of your total calories each day.  Limit the amount of cholesterol in your diet to less than _________ mg per day. WHAT TYPES OF FAT SHOULD I CHOOSE?  Choose healthy fats more often. Choose monounsaturated and polyunsaturated fats, such as olive oil and canola oil, flaxseeds, walnuts, almonds, and seeds.  Eat more omega-3 fats.  Good choices include salmon, mackerel, sardines, tuna, flaxseed oil, and ground flaxseeds. Aim to eat fish at least two times each week.  Limit saturated fats. Saturated fats are primarily found in animal products, such as meats, butter, and cream. Plant sources of saturated fats include palm oil, palm kernel oil, and coconut oil.  Avoid foods with partially hydrogenated oils in them. These contain trans fats. Examples of foods that contain trans fats are stick margarine, some tub margarines, cookies, crackers, and other baked goods. WHAT GENERAL GUIDELINES DO I NEED TO FOLLOW?  Check food labels carefully to identify foods with trans fats or high amounts of saturated fat.  Fill one half of your plate with vegetables and green salads. Eat 4-5 servings of vegetables per day. A serving of vegetables equals 1 cup of raw leafy vegetables,  cup of raw or cooked cut-up vegetables, or  cup of vegetable juice.  Fill one fourth of your plate with whole grains. Look for the word "whole" as the first word in the ingredient list.  Fill one fourth of your plate with lean protein foods.  Eat 4-5 servings of fruit per day. A serving of fruit equals one medium whole fruit,  cup of dried fruit,  cup of fresh, frozen, or canned fruit, or  cup of 100% fruit juice.  Eat more foods  that contain soluble fiber. Examples of foods that contain this type of fiber are apples, broccoli, carrots, beans, peas, and barley. Aim to get 20-30 g of fiber per day.  Eat more home-cooked food and less restaurant, buffet, and fast food.  Limit or avoid alcohol.  Limit foods that are high in starch and sugar.  Avoid fried foods.  Cook foods by using methods other than frying. Baking, boiling, grilling, and broiling are all great options. Other fat-reducing suggestions include:  Removing the skin from poultry.  Removing all visible fats from meats.  Skimming the fat off of stews, soups, and gravies before serving  them.  Steaming vegetables in water or broth.  Lose weight if you are overweight. Losing just 5-10% of your initial body weight can help your overall health and prevent diseases such as diabetes and heart disease.  Increase your consumption of nuts, legumes, and seeds to 4-5 servings per week. One serving of dried beans or legumes equals  cup after being cooked, one serving of nuts equals 1 ounces, and one serving of seeds equals  ounce or 1 tablespoon.  You may need to monitor your salt (sodium) intake, especially if you have high blood pressure. Talk with your health care provider or dietitian to get more information about reducing sodium. WHAT FOODS CAN I EAT? Grains Breads, including [place], white, pita, wheat, raisin, rye, oatmeal, and [place]. Tortillas that are neither fried nor made with lard or trans fat. Low-fat rolls, including hotdog and hamburger buns and English muffins. Biscuits. Muffins. Waffles. Pancakes. Light popcorn. Whole-grain cereals. Flatbread. Melba toast. Pretzels. Breadsticks. Rusks. Low-fat snacks and crackers, including oyster, saltine, matzo, graham, animal, and rye. Rice and pasta, including brown rice and those that are made with whole wheat. Vegetables All vegetables. Fruits All fruits, but limit coconut. Meats and Other Protein Sources Lean, well-trimmed beef, veal, pork, and lamb. Chicken and [place] without skin. All fish and shellfish. Wild duck, rabbit, pheasant, and venison. Egg whites or low-cholesterol egg substitutes. Dried beans, peas, lentils, and tofu.Seeds and most nuts. Dairy Low-fat or nonfat cheeses, including ricotta, string, and mozzarella. Skim or 1% milk that is liquid, powdered, or evaporated. Buttermilk that is made with low-fat milk. Nonfat or low-fat yogurt. Beverages Mineral water. Diet carbonated beverages. Sweets and Desserts Sherbets and fruit ices. Honey, jam, marmalade, jelly, and syrups. Meringues and gelatins. Pure sugar  candy, such as hard candy, jelly beans, gumdrops, mints, marshmallows, and small amounts of dark chocolate. [employer] Eat all sweets and desserts in moderation. Fats and Oils Nonhydrogenated (trans-free) margarines. Vegetable oils, including soybean, sesame, sunflower, olive, peanut, safflower, corn, canola, and cottonseed. Salad dressings or mayonnaise that are made with a vegetable oil. Limit added fats and oils that you use for cooking, baking, salads, and as spreads. Other Cocoa powder. Coffee and tea. All seasonings and condiments. The items listed above may not be a complete list of recommended foods or beverages. Contact your dietitian for more options. WHAT FOODS ARE NOT RECOMMENDED? Grains Breads that are made with saturated or trans fats, oils, or whole milk. Croissants. Butter rolls. Cheese breads. Sweet rolls. Donuts. Buttered popcorn. Chow mein noodles. High-fat crackers, such as cheese or butter crackers. Meats and Other Protein Sources Fatty meats, such as hotdogs, short ribs, sausage, spareribs, bacon, ribeye roast or steak, and mutton. High-fat deli meats, such as salami and bologna. Caviar. Domestic duck and goose. Organ meats, such as kidney, liver, sweetbreads, brains, gizzard, chitterlings, and heart. Dairy Cream, sour  cream, cream cheese, and creamed cottage cheese. Whole milk cheeses, including blue (bleu), Monte[place], [place], [place], American, [place], Swiss, [place], [place], and [place]. Whole or 2% milk that is liquid, evaporated, or condensed. Whole buttermilk. Cream sauce or high-fat cheese sauce. Yogurt that is made from whole milk. Beverages Regular sodas and drinks with added sugar. Sweets and Desserts Frosting. Pudding. Cookies. Cakes other than angel food cake. Candy that has milk chocolate or white chocolate, hydrogenated fat, butter, coconut, or unknown ingredients. Buttered syrups. Full-fat ice cream or ice cream drinks. Fats and Oils Gravy that has  suet, meat fat, or shortening. Cocoa butter, hydrogenated oils, palm oil, coconut oil, palm kernel oil. These can often be found in baked products, candy, fried foods, nondairy creamers, and whipped toppings. Solid fats and shortenings, including bacon fat, salt pork, lard, and butter. Nondairy cream substitutes, such as coffee creamers and sour cream substitutes. Salad dressings that are made of unknown oils, cheese, or sour cream. The items listed above may not be a complete list of foods and beverages to avoid. Contact your dietitian for more information.   This information is not intended to replace advice given to you by your health care provider. Make sure you discuss any questions you have with your health care provider.   Document Released: 07/11/2008 Document Revised: 10/23/2014 Document Reviewed: 03/26/2014 Elsevier Interactive Patient Education [employer].

## 2016-05-17 ENCOUNTER — Encounter: Payer: Self-pay | Admitting: Family Medicine

## 2016-05-17 LAB — COMPLETE METABOLIC PANEL WITH GFR
ALBUMIN: 4.1 g/dL (ref 3.6–5.1)
ALK PHOS: 67 U/L (ref 33–130)
ALT: 12 U/L (ref 6–29)
AST: 13 U/L (ref 10–35)
BILIRUBIN TOTAL: 0.2 mg/dL (ref 0.2–1.2)
BUN: 15 mg/dL (ref 7–25)
CALCIUM: 9.3 mg/dL (ref 8.6–10.4)
CHLORIDE: 108 mmol/L (ref 98–110)
CO2: 22 mmol/L (ref 20–31)
CREATININE: 0.88 mg/dL (ref 0.50–0.99)
GFR, EST AFRICAN AMERICAN: 83 mL/min (ref >=60)
GFR, Est Non African American: 72 mL/min (ref >=60)
Glucose, Bld: 90 mg/dL (ref 65–99)
Potassium: 4 mmol/L (ref 3.5–5.3)
Sodium: 141 mmol/L (ref 135–146)
Total Protein: 6.5 g/dL (ref 6.1–8.1)

## 2016-05-17 LAB — LIPID PANEL
Cholesterol: 187 mg/dL (ref 125–200)
HDL: 79 mg/dL (ref >=46)
LDL CALC: 80 mg/dL (ref <130)
Total CHOL/HDL Ratio: 2.4 Ratio (ref <=5.0)
Triglycerides: 138 mg/dL (ref <150)
VLDL: 28 mg/dL (ref <30)

## 2016-05-17 LAB — PAP IG AND HPV HIGH-RISK: HPV DNA High Risk: NOT DETECTED

## 2016-05-17 LAB — HEPATITIS C ANTIBODY: HCV AB: NEGATIVE

## 2016-05-17 LAB — HIV ANTIBODY (ROUTINE TESTING W REFLEX): HIV: NONREACTIVE

## 2016-05-17 NOTE — Telephone Encounter (Signed)
error 

## 2016-05-24 ENCOUNTER — Telehealth: Payer: Self-pay

## 2016-05-24 ENCOUNTER — Telehealth: Payer: Self-pay | Admitting: Emergency Medicine

## 2016-05-24 NOTE — Telephone Encounter (Signed)
PATIENT STATES SHE HAD HER PHYSICAL DONE ABOUT 1 WEEK AGO WITH KIMBERLY HARRIS. SHE GOT HER LAB RESULTS IN THE MAIL, BUT SHE DOES NOT UNDERSTAND HOW TO READ IT. SHE WOULD LIKE SOMEONE TO GO OVER THE RESULTS WITH HER PLEASE. BEST PHONE [phone] (CELL)  PHARMACY CHOICE IS [facility] [address] [place]

## 2016-05-24 NOTE — Telephone Encounter (Signed)
Please contact the patient and advise that labs were as expected and to follow-up in 12 months.  Thanks,  Molli Barrows     May 17, 2016   [place] [address] [place] [address]   Dear Ms. Mosey,  Below are the results from your recent visit:  Resulted Orders  HIV antibody (with reflex)  Result Value Ref Range   HIV 1&2 Ab, 4th Generation NONREACTIVE NONREACTIVE     Comment:       HIV-1 antigen and HIV-1/HIV-2 antibodies were not detected.  There is no laboratory evidence of HIV infection.   HIV-1/2 Antibody Diff        Not indicated. HIV-1 RNA, Qual TMA          Not indicated.     PLEASE NOTE: This information has been disclosed to you from records whose confidentiality may be protected by state law. If your state requires such protection, then the state law prohibits you from making any further disclosure of the information without the specific written consent of the person to whom it pertains, or as otherwise permitted by law. A general authorization for the release of medical or other information is NOT sufficient for this purpose.   The performance of this assay has not been clinically validated in patients less than 64 years old.   For additional information please refer to http://education.questdiagnostics.com/faq/FAQ106.  (This link is being provided for informational/educational purposes only.)      Narrative   Performed at:  [facility]                [address], [address]                [place], [place] [address]  Hepatitis C antibody  Result Value Ref Range   HCV Ab NEGATIVE NEGATIVE   Narrative   Performed at:  [place], [address]                [place], [place] [address]  Pap IG and HPV (high risk) DNA detection  Result Value Ref Range   HPV DNA High Risk Not Detected      Comment:     HIGH RISK HPV types (16,18,31,33,35,39,45,51,52,56,58,59,66,68) were not  detected. Other HPV types which cause anogenital lesions may be present. The significance of the other types of HPV in malignant  processes has not been established.                  ** Normal Reference Range: Not Detected **      HPV High Risk testing performed using the APTIMA HPV mRNA Assay.      Specimen adequacy: SEE NOTE      Comment:     SATISFACTORY.  Endocervical/transformation zone component present.   FINAL DIAGNOSIS: SEE NOTE      Comment:     - NEGATIVE FOR INTRAEPITHELIAL LESIONS OR MALIGNANCY.    COMMENTS: SEE NOTE      Comment:     This Pap test has been evaluated with computer assisted technology.   Cytotechnologist: SEE NOTE      Comment:     JRW, BS CT(ASCP) *  The Pap is a screening test for cervical cancer. It is not a  diagnostic test and is subject to false negative and false positive  results. It is most reliable when a satisfactory  sample, regularly  obtained, is submitted with relevant clinical findings and history,  and when the Pap result is evaluated along with historic and current  clinical information.    Narrative   Performed at:  [facility]                [address]                [place], [place] [address]  TSH  Result Value Ref Range   TSH 1.80 mIU/L     Comment:       Reference Range   > or = 20 Years  0.40-4.50   Pregnancy Range First trimester  0.26-2.66 Second trimester 0.55-2.73 Third trimester  0.43-2.91      Narrative   Performed at:  [place], [address]                [place], [place] [address]  Lipid panel  Result Value Ref Range   Cholesterol 187 125 - 200 mg/dL   Triglycerides 138 <150 mg/dL   HDL 79 >=46 mg/dL   Total CHOL/HDL Ratio 2.4 <=5.0 Ratio   VLDL 28 <30 mg/dL   LDL Cholesterol 80 <130 mg/dL     Comment:       Total Cholesterol/HDL Ratio:CHD Risk                        Coronary Heart Disease Risk Table                                         Men       Women          1/2 Average Risk              3.4        3.3              Average Risk              5.0        4.4           2X Average Risk              9.6        7.1           3X Average Risk             23.4       11.0 Use the calculated Patient Ratio above and the CHD Risk table  to determine the patient's CHD Risk.    Narrative   Performed at:  [place], [address]                [place], [place] [address]  CBC with Differential/Platelet  Result Value Ref Range   WBC 10.3 3.8 - 10.8 K/uL   RBC 4.73 3.80 - 5.10 MIL/uL   Hemoglobin 12.0 11.7 - 15.5 g/dL   HCT 39.0 35.0 - 45.0 %   MCV 82.5 80.0 - 100.0 fL   MCH 25.4 (L) 27.0 - 33.0 pg   MCHC 30.8 (L) 32.0 - 36.0 g/dL   RDW 14.8 11.0 - 15.0 %  Platelets 279 140 - 400 K/uL   MPV 11.1 7.5 - 12.5 fL   Neutro Abs 6,180 1,500 - 7,800 cells/uL   Lymphs Abs 3,502 850 - 3,900 cells/uL   Monocytes Absolute 515 200 - 950 cells/uL   Eosinophils Absolute 103 15 - 500 cells/uL   Basophils Absolute 0 0 - 200 cells/uL   Neutrophils Relative % 60 %   Lymphocytes Relative 34 %   Monocytes Relative 5 %   Eosinophils Relative 1 %   Basophils Relative 0 %   Smear Review Criteria for review not met      Comment:     ** Please note change in unit of measure and reference range(s). **   Narrative   Performed at:  [facility]                [address]                [place], [place] [address]  COMPLETE METABOLIC PANEL WITH GFR  Result Value Ref Range   Sodium 141 135 - 146 mmol/L   Potassium 4.0 3.5 - 5.3 mmol/L   Chloride 108 98 - 110 mmol/L   CO2 22 20 - 31 mmol/L   Glucose, Bld 90 65 - 99 mg/dL   BUN 15 7 - 25 mg/dL   Creat 0.88 0.50 - 0.99 mg/dL     Comment:       For patients > or = 60 years of age: The upper reference limit for Creatinine is approximately 13% higher for people identified as African-American.      Total Bilirubin 0.2 0.2 - 1.2 mg/dL    Alkaline Phosphatase 67 33 - 130 U/L   AST 13 10 - 35 U/L   ALT 12 6 - 29 U/L   Total Protein 6.5 6.1 - 8.1 g/dL   Albumin 4.1 3.6 - 5.1 g/dL   Calcium 9.3 8.6 - 10.4 mg/dL   GFR, Est African American 83 >=60 mL/min   GFR, Est Non African American 72 >=60 mL/min   Narrative   Performed at:  [place], [address]                [place], [place] [address]   Your labs results were as expected.   If you have any questions or concerns, please don't hesitate to call.  Sincerely,   Carroll Sage. Kenton Kingfisher, MSN, FNP-C [facility]

## 2016-05-24 NOTE — Telephone Encounter (Signed)
Doris Green, This patient results were sent via mail without results posted from you. She doesn't understand numbers/readings. I explained to her after you post result lab note, I will go over results.

## 2016-05-24 NOTE — Telephone Encounter (Signed)
Left message to return call for lab results.

## 2016-07-06 ENCOUNTER — Ambulatory Visit (INDEPENDENT_AMBULATORY_CARE_PROVIDER_SITE_OTHER): Payer: BC Managed Care – PPO | Admitting: Cardiovascular Disease

## 2016-07-06 ENCOUNTER — Encounter: Payer: Self-pay | Admitting: Cardiovascular Disease

## 2016-07-06 VITALS — BP 118/78 | HR 69 | Ht 62.0 in | Wt 225.8 lb

## 2016-07-06 DIAGNOSIS — E669 Obesity, unspecified: Secondary | ICD-10-CM | POA: Diagnosis not present

## 2016-07-06 DIAGNOSIS — I493 Ventricular premature depolarization: Secondary | ICD-10-CM | POA: Diagnosis not present

## 2016-07-06 NOTE — Patient Instructions (Signed)
Medication Instructions:  .Your physician recommends that you continue on your current medications as directed. Please refer to the Current Medication list given to you today.  Labwork: none  Testing/Procedures: none  Follow-Up: As needed   

## 2016-07-06 NOTE — Addendum Note (Signed)
Addended by: Alvina Filbert B on: 07/06/2016 09:39 AM   Modules accepted: Orders

## 2016-07-06 NOTE — Progress Notes (Signed)
Cardiology Office Note   Date:  07/06/2016   ID:  Gitzel, Gowans 17-Jun-1956, MRN [id number]  PCP:  Molli Barrows, FNP  Cardiologist:   Skeet Latch, MD   Chief Complaint  Patient presents with  . New Patient (Initial Visit)    Pt states edema in feet standing long periods of time.       History of Present Illness: Doris Green is a 60 y.o. female with obesity who presents for an evaluation of an abnormal EKG.  Doris Green recently saw her PCP, Molli Barrows, FNP, for an annual follow up.  At that time she had an EKG that showed sinus rhythm with PVCs.  Given that her mother and sister both had heart attacks in their 3s, she was referred to cardiology for evaluation.  Doris Green has been feeling well.  She works as a Sports coach And denies any exertional chest pain or shortness of breath. She does not get any exercise outside of work. She does note some swelling in bilateral feet after she has been standing on them all day. However, she denies any orthopnea or PND, and the swelling improves after elevating them at night. She has tried wearing compression stockings in the past which helped.  Doris Green notes occasional palpitations that last for a few seconds. She denies any associated lightheadedness, dizziness, or shortness of breath.   She drinks coffee up to 4 times per day and notes that the palpitations are worse when she has caffeine. Lately she has not been drinking as much coffee in hasn't noted the palpitations as frequently. They do not occur with exertion.  She also endorses GERD that is worse after eating foods with tomato sauce or when taking her fish oil tablets. It got worse in the last 2 weeks after she started taking Meloxicam for her knee.   Doris Green eats  a lot of fruits and vegetables. She does endorse snacking tries to eat healthy snacks like Sonata 10 fruits.    Past Medical History:  Diagnosis Date  . Allergy   . Depression   .  UTERINE FIBROID 12/13/2006    Past Surgical History:  Procedure Laterality Date  . SHOULDER SURGERY    . TUBAL LIGATION       Current Outpatient Prescriptions  Medication Sig Dispense Refill  . meloxicam (MOBIC) 15 MG tablet     . Multiple Vitamins-Minerals (MULTIVITAL PO) Take by mouth.    . Multiple Vitamins-Minerals (WOMENS HAIR, SKIN & NAILS PO) Take by mouth.    . Omega-3 1000 MG CAPS Take by mouth.    . ranitidine (ZANTAC) 150 MG tablet Take 1 tablet (150 mg total) by mouth 2 (two) times daily. 60 tablet 0  . Turmeric POWD by Does not apply route.     No current facility-administered medications for this visit.     Allergies:   Ibuprofen    Social History:  The patient  reports that she quit smoking about 4 years ago. Her smoking use included Cigarettes. She has never used smokeless tobacco. She reports that she does not drink alcohol or use drugs.   Family History:  The patient's family history includes Cancer in her father and maternal grandmother; Diabetes in her brother, mother, and sister; Heart disease in her mother and sister; Hypertension in her brother, mother, and sister.    ROS:  Please see the history of present illness.   Otherwise, review of systems are positive for none.  All other systems are reviewed and negative.    PHYSICAL EXAM: VS:  BP 118/78   Pulse 69   Ht 5\' 2"  (1.575 m)   Wt 225 lb 12.8 oz (102.4 kg)   BMI 41.30 kg/m  , BMI Body mass index is 41.3 kg/m. GENERAL:  Well appearing HEENT:  Pupils equal round and reactive, fundi not visualized, oral mucosa unremarkable NECK:  No jugular venous distention, waveform within normal limits, carotid upstroke brisk and symmetric, no bruits, no thyromegaly LYMPHATICS:  No cervical adenopathy LUNGS:  Clear to auscultation bilaterally HEART:  RRR.  PMI not displaced or sustained,S1 and S2 within normal limits, no S3, no S4, no clicks, no rubs, no murmurs ABD:  Flat, positive bowel sounds normal in  frequency in pitch, no bruits, no rebound, no guarding, no midline pulsatile mass, no hepatomegaly, no splenomegaly EXT:  2 plus pulses throughout, no edema, no cyanosis no clubbing SKIN:  No rashes no nodules NEURO:  Cranial nerves II through XII grossly intact, motor grossly intact throughout PSYCH:  Cognitively intact, oriented to person place and time    EKG:  EKG is ordered today. The ekg ordered 07/06/16 demonstrates sinus rhythm. Rate 66 bpm.  Recent Labs: 05/16/2016: ALT 12; BUN 15; Creat 0.88; Hemoglobin 12.0; Platelets 279; Potassium 4.0; Sodium 141; TSH 1.80    Lipid Panel    Component Value Date/Time   CHOL 187 05/16/2016 1443   TRIG 138 05/16/2016 1443   HDL 79 05/16/2016 1443   CHOLHDL 2.4 05/16/2016 1443   VLDL 28 05/16/2016 1443   LDLCALC 80 05/16/2016 1443      Wt Readings from Last 3 Encounters:  07/06/16 225 lb 12.8 oz (102.4 kg)  05/16/16 225 lb (102.1 kg)  04/26/16 224 lb (101.6 kg)      ASSESSMENT AND PLAN:  # PVCs: Doris Green had an EKG at her PCPs office that revealed multiple PVCs. Only one lead was shown.    her EKG today does not show any PVCs. I suspect that these are related to her high intake of caffeine. She has limited her caffeine and the PVCs have resolved. She artery had thyroid testing, CBC, and a basic metabolic panel with her PCP that were unremarkable. Given that she does not have exertional symptoms, I do not think that any stress testing is indicated at this time.   # LE Edema: Likely due to venous stasis.  Doris Green does not have any evidence of heart failure on exam.  I suggested that she wear compression stockings at work and limit salt intake.   # CV Disease Prevention: Doris Green' ASCVD 10 year risk is 3.1%.  Therefore, aspirin or statin are not indicated. We discussed the importance of weight loss. I suggested that she keep a food diary, and she does not think she is taking in many calories. I also suggested that she increase  her physical activity to include at least 30-40 minutes of exercise most days of the week.   Current medicines are reviewed at length with the patient today.  The patient does not have concerns regarding medicines.  The following changes have been made:  no change  Labs/ tests ordered today include:  No orders of the defined types were placed in this encounter.    Disposition:   FU with Martavis Gurney C. Oval Linsey, MD, [facility] as needed.     This note was written with the assistance of speech recognition software.  Please excuse any transcriptional errors.  Signed,  Azlaan Isidore C. Oval Linsey, MD, [facility]  07/06/2016 9:04 AM    [place]

## 2016-11-16 ENCOUNTER — Ambulatory Visit (AMBULATORY_SURGERY_CENTER): Payer: Self-pay

## 2016-11-16 ENCOUNTER — Encounter: Payer: Self-pay | Admitting: Gastroenterology

## 2016-11-16 VITALS — Ht 62.0 in | Wt 221.8 lb

## 2016-11-16 DIAGNOSIS — Z1211 Encounter for screening for malignant neoplasm of colon: Secondary | ICD-10-CM

## 2016-11-16 MED ORDER — SUPREP BOWEL PREP KIT 17.5-3.13-1.6 GM/177ML PO SOLN: 1.0000 | Freq: Once | ORAL | 0 refills | Status: AC

## 2016-11-16 NOTE — Progress Notes (Signed)
No allergies to eggs or soy No diet meds No home oxygen No past problems with anesthesia  Declined emmi 

## 2016-11-24 ENCOUNTER — Encounter: Payer: Self-pay | Admitting: Gastroenterology

## 2016-11-24 ENCOUNTER — Ambulatory Visit (AMBULATORY_SURGERY_CENTER): Payer: BC Managed Care – PPO | Admitting: Gastroenterology

## 2016-11-24 VITALS — BP 144/89 | HR 67 | Temp 97.1°F | Resp 12 | Ht 62.0 in | Wt 221.0 lb

## 2016-11-24 DIAGNOSIS — Z1212 Encounter for screening for malignant neoplasm of rectum: Secondary | ICD-10-CM | POA: Diagnosis not present

## 2016-11-24 DIAGNOSIS — D122 Benign neoplasm of ascending colon: Secondary | ICD-10-CM | POA: Diagnosis not present

## 2016-11-24 DIAGNOSIS — Z1211 Encounter for screening for malignant neoplasm of colon: Secondary | ICD-10-CM

## 2016-11-24 MED ORDER — SODIUM CHLORIDE 0.9 % IV SOLN: 500.0000 mL | INTRAVENOUS | Status: DC

## 2016-11-24 NOTE — Patient Instructions (Signed)
YOU HAD AN ENDOSCOPIC PROCEDURE TODAY AT [facility]:   Refer to the procedure report that was given to you for any specific questions about what was found during the examination.  If the procedure report does not answer your questions, please call your gastroenterologist to clarify.  If you requested that your care partner not be given the details of your procedure findings, then the procedure report has been included in a sealed envelope for you to review at your convenience later.  YOU SHOULD EXPECT: Some feelings of bloating in the abdomen. Passage of more gas than usual.  Walking can help get rid of the air that was put into your GI tract during the procedure and reduce the bloating. If you had a lower endoscopy (such as a colonoscopy or flexible sigmoidoscopy) you may notice spotting of blood in your stool or on the toilet paper. If you underwent a bowel prep for your procedure, you may not have a normal bowel movement for a few days.  Please Note:  You might notice some irritation and congestion in your nose or some drainage.  This is from the oxygen used during your procedure.  There is no need for concern and it should clear up in a day or so.  SYMPTOMS TO REPORT IMMEDIATELY:   Following lower endoscopy (colonoscopy or flexible sigmoidoscopy):  Excessive amounts of blood in the stool  Significant tenderness or worsening of abdominal pains  Swelling of the abdomen that is new, acute  Fever of 100F or higher   For urgent or emergent issues, a gastroenterologist can be reached at any hour by calling [phone].   DIET:  We do recommend a small meal at first, but then you may proceed to your regular diet.  Drink plenty of fluids but you should avoid alcoholic beverages for 24 hours.  ACTIVITY:  You should plan to take it easy for the rest of today and you should NOT DRIVE or use heavy machinery until tomorrow (because of the sedation medicines used during the test).     FOLLOW UP: Our staff will call the number listed on your records the next business day following your procedure to check on you and address any questions or concerns that you may have regarding the information given to you following your procedure. If we do not reach you, we will leave a message.  However, if you are feeling well and you are not experiencing any problems, there is no need to return our call.  We will assume that you have returned to your regular daily activities without incident.  If any biopsies were taken you will be contacted by phone or by letter within the next 1-3 weeks.  Please call us at [phone] if you have not heard about the biopsies in 3 weeks.    SIGNATURES/CONFIDENTIALITY: You and/or your care partner have signed paperwork which will be entered into your electronic medical record.  These signatures attest to the fact that that the information above on your After Visit Summary has been reviewed and is understood.  Full responsibility of the confidentiality of this discharge information lies with you and/or your care-partner.  Polyp, diverticulosis, high fiber diet information given.  See Dermatologist for skin lesion if not already evalutated.

## 2016-11-24 NOTE — Op Note (Signed)
[facility] Patient Name: Doris Green Procedure Date: 11/24/2016 1:19 PM MRN: [id number] Endoscopist: Mallie Mussel L. Loletha Carrow , MD Age: 61 Referring MD:  Date of Birth: 1956-04-07 Gender: Female Account #: [id number] Procedure:                Colonoscopy Indications:              Surveillance: Personal history of adenomatous                            polyps on last colonoscopy > 3 years ago (60mm TVA                            in 2014) Medicines:                Monitored Anesthesia Care Procedure:                Pre-Anesthesia Assessment:                           - Prior to the procedure, a History and Physical                            was performed, and patient medications and                            allergies were reviewed. The patient's tolerance of                            previous anesthesia was also reviewed. The risks                            and benefits of the procedure and the sedation                            options and risks were discussed with the patient.                            All questions were answered, and informed consent                            was obtained. Prior Anticoagulants: The patient has                            taken no previous anticoagulant or antiplatelet                            agents. ASA Grade Assessment: II - A patient with                            mild systemic disease. After reviewing the risks                            and benefits, the patient was deemed in  satisfactory condition to undergo the procedure.                           After obtaining informed consent, the colonoscope                            was passed under direct vision. Throughout the                            procedure, the patient's blood pressure, pulse, and                            oxygen saturations were monitored continuously. The                            Model CF-HQ190L [id number]) scope was introduced                         through the anus and advanced to the the cecum,                            identified by appendiceal orifice and ileocecal                            valve. The colonoscopy was performed without                            difficulty. The patient tolerated the procedure                            well. The quality of the bowel preparation was                            excellent. The ileocecal valve, appendiceal                            orifice, and rectum were photographed. The quality                            of the bowel preparation was evaluated using the                            BBPS [facility] Bowel Preparation Scale) with scores                            of: Right Colon = 3, Transverse Colon = 3 and Left                            Colon = 3 (entire mucosa seen well with no residual                            staining, small fragments of stool or opaque  liquid). The total BBPS score equals 9. The bowel                            preparation used was SUPREP. Scope In: 1:29:46 PM Scope Out: 1:41:37 PM Scope Withdrawal Time: 0 hours 10 minutes 15 seconds  Total Procedure Duration: 0 hours 11 minutes 51 seconds  Findings:                 The perianal exam findings include an                            irregularly-shaped patch of raised mucosa in the                            gluteal folds.                           A 2 mm polyp was found in the proximal ascending                            colon. The polyp was sessile. The polyp was removed                            with a piecemeal technique using a cold biopsy                            forceps. Resection and retrieval were complete.                           Many small-mouthed diverticula were found in the                            entire colon.                           The exam was otherwise without abnormality on                            direct and retroflexion views.                            A tattoo was seen in the recto-sigmoid colon at the                            site of prior polypectomy. The tattoo site appeared                            normal with no polyp tissue. Complications:            No immediate complications. Estimated Blood Loss:     Estimated blood loss: none. Impression:               - An irregularly-shaped patch of raised mucosa in  the gluteal folds found on perianal exam.                           - One 2 mm polyp in the proximal ascending colon,                            removed piecemeal using a cold biopsy forceps.                            Resected and retrieved.                           - Diverticulosis in the entire examined colon.                           - The examination was otherwise normal on direct                            and retroflexion views.                           - A tattoo was seen in the recto-sigmoid colon. The                            tattoo site appeared normal. Recommendation:           - Patient has a contact number available for                            emergencies. The signs and symptoms of potential                            delayed complications were discussed with the                            patient. Return to normal activities tomorrow.                            Written discharge instructions were provided to the                            patient.                           - Resume previous diet.                           - Continue present medications.                           - Await pathology results.                           - Repeat colonoscopy in 5 years for surveillance.                           -  Seek Dermatology consultation for skin lesion if                            not previously evaluated. Marygrace Sandoval L. Loletha Carrow, MD 11/24/2016 1:50:30 PM This report has been signed electronically.

## 2016-11-24 NOTE — Progress Notes (Signed)
Called to room to assist during endoscopic procedure.  Patient ID and intended procedure confirmed with present staff. Received instructions for my participation in the procedure from the performing physician.  

## 2016-11-24 NOTE — Progress Notes (Signed)
A/ox3 pleased with MAC, report to [employer]

## 2016-11-27 ENCOUNTER — Telehealth: Payer: Self-pay

## 2016-11-27 NOTE — Telephone Encounter (Signed)
  Follow up Call-  Call back number 11/24/2016  Post procedure Call Back phone  # [phone]  Permission to leave phone message Yes  Some recent data might be hidden     Patient questions:  Do you have a fever, pain , or abdominal swelling? No. Pain Score  0 *  Have you tolerated food without any problems? Yes.    Have you been able to return to your normal activities? Yes.    Do you have any questions about your discharge instructions: Diet   No. Medications  No. Follow up visit  No.  Do you have questions or concerns about your Care? No.  Actions: * If pain score is 4 or above: No action needed, pain <4.

## 2016-11-30 ENCOUNTER — Encounter: Payer: Self-pay | Admitting: Gastroenterology

## 2016-12-11 ENCOUNTER — Other Ambulatory Visit: Payer: Self-pay | Admitting: Family Medicine

## 2017-01-08 ENCOUNTER — Ambulatory Visit (INDEPENDENT_AMBULATORY_CARE_PROVIDER_SITE_OTHER): Payer: BC Managed Care – PPO | Admitting: Physician Assistant

## 2017-01-08 VITALS — BP 116/82 | HR 76 | Temp 99.8°F | Resp 18 | Ht 62.0 in | Wt 216.4 lb

## 2017-01-08 DIAGNOSIS — R0981 Nasal congestion: Secondary | ICD-10-CM

## 2017-01-08 DIAGNOSIS — R05 Cough: Secondary | ICD-10-CM

## 2017-01-08 DIAGNOSIS — J101 Influenza due to other identified influenza virus with other respiratory manifestations: Secondary | ICD-10-CM | POA: Diagnosis not present

## 2017-01-08 DIAGNOSIS — R059 Cough, unspecified: Secondary | ICD-10-CM

## 2017-01-08 LAB — POCT INFLUENZA A/B
Influenza A, POC: NEGATIVE
Influenza B, POC: POSITIVE — AB

## 2017-01-08 MED ORDER — BENZONATATE 100 MG PO CAPS: 100.0000 mg | ORAL_CAPSULE | Freq: Three times a day (TID) | ORAL | 0 refills | Status: DC | PRN

## 2017-01-08 MED ORDER — OSELTAMIVIR PHOSPHATE 75 MG PO CAPS: 75.0000 mg | ORAL_CAPSULE | Freq: Two times a day (BID) | ORAL | 0 refills | Status: DC

## 2017-01-08 MED ORDER — GUAIFENESIN-CODEINE 100-10 MG/5ML PO SOLN: 5.0000 mL | Freq: Every evening | ORAL | 0 refills | Status: DC | PRN

## 2017-01-08 MED ORDER — FLUTICASONE PROPIONATE 50 MCG/ACT NA SUSP: 2.0000 | Freq: Every day | NASAL | 0 refills | Status: DC

## 2017-01-08 NOTE — Patient Instructions (Addendum)
You have tested positive for the flu, you are contagious until you are fever free for 24 hours without using tylenol or ibuprofen.Please stay out of work until you are no longer contagious. Two major complications after the flu are pneumonia and sinus infections. Please be aware of this and if you are not any better in 7-10 days or you develop worsening cough or sinus pressure, seek care at our clinic or the ED. Continue to wash your hands and wear a mask daily especially around other people.   - I recommend you rest, drink plenty of fluids, eat light meals including soups.  - You may use cough syrup at night for your cough and sore throat, Tessalon pearls during the day. Be aware that cough syrup can definitely make you drowsy and sleepy so do not drive or operate any heavy machinery if it is affecting you during the day.   - You may also use flonase daily for nasal congestion.        IF you received an x-ray today, you will receive an invoice from [facility]. Please contact [facility] at [phone] with questions or concerns regarding your invoice.   IF you received labwork today, you will receive an invoice from [facility]. Please contact [facility] at [phone] with questions or concerns regarding your invoice.   Our billing staff will not be able to assist you with questions regarding bills from these companies.  You will be contacted with the lab results as soon as they are available. The fastest way to get your results is to activate your My Chart account. Instructions are located on the last page of this paperwork. If you have not heard from [place] regarding the results in 2 weeks, please contact this office.

## 2017-01-08 NOTE — Progress Notes (Signed)
MRN: [id number] DOB: 07-15-1956  Subjective:   Doris Green is a 61 y.o. female presenting for chief complaint of Generalized Body Aches .  Reports sudden onset of 3 day history of subjective fever, sinus congestion, ear fullness, sore throat, productive cough (no hemoptysis), wheezing and myalgia, night sweats and diarrhea. Has tried alka seltzer plus with no relief. Denies ear pain, difficulty swallowing, chest tightness and chest pain, chills, vomiting and abdominal pain. Has had sick contact with daughter, who has a cold. She is a Diplomatic Services operational officer. No history of seasonal allergies or asthma. Patient has not had flu shot this season. Denies smoking, no alcohol use. Denies any other aggravating or relieving factors, no other questions or concerns.  Doris Green has a current medication list which includes the following prescription(s): meloxicam, multiple vitamins-minerals, multiple vitamins-minerals, omega-3, probiotic product, turmeric, aspirin ec, and ranitidine, and the following Facility-Administered Medications: sodium chloride. Also is allergic to ibuprofen.  Doris Green  has a past medical history of Allergy; Arthritis; Depression; History of blood transfusion; Spider bite; and UTERINE FIBROID (12/13/2006). Also  has a past surgical history that includes Shoulder surgery and Tubal ligation.   Objective:   Vitals: BP 116/82 (BP Location: Right Arm, Patient Position: Sitting, Cuff Size: Large)   Pulse 76   Temp 99.8 F (37.7 C) (Oral)   Resp 18   Ht 5\' 2"  (1.575 m)   Wt 216 lb 6.4 oz (98.2 kg)   SpO2 98%   BMI 39.58 kg/m   Physical Exam  Constitutional: She is oriented to person, place, and time. She appears well-developed and well-nourished. She appears distressed (appears uncomfortable lying on exam table with blanket on).  HENT:  Head: Normocephalic and atraumatic.  Right Ear: External ear and ear canal normal. A middle ear effusion is present.  Left Ear: External  ear and ear canal normal. A middle ear effusion is present.  Nose: Mucosal edema (mild, bilaterally) and rhinorrhea present. Right sinus exhibits maxillary sinus tenderness ( mild to palpation). Right sinus exhibits no frontal sinus tenderness. Left sinus exhibits maxillary sinus tenderness (mild to palpation). Left sinus exhibits no frontal sinus tenderness.  Mouth/Throat: Uvula is midline and mucous membranes are normal. Posterior oropharyngeal erythema present. No tonsillar exudate.  Eyes: Conjunctivae are normal.  Neck: Normal range of motion.  Cardiovascular: Normal rate, regular rhythm and normal heart sounds.   Pulmonary/Chest: Effort normal and breath sounds normal. She has no wheezes. She has no rales.  Lymphadenopathy:       Head (right side): No submental, no submandibular, no tonsillar, no preauricular, no posterior auricular and no occipital adenopathy present.       Head (left side): No submental, no submandibular, no tonsillar, no preauricular, no posterior auricular and no occipital adenopathy present.    She has no cervical adenopathy.       Right: No supraclavicular adenopathy present.       Left: No supraclavicular adenopathy present.  Neurological: She is alert and oriented to person, place, and time.  Skin: Skin is warm.  Skin feels warm to palpation.   Psychiatric: She has a normal mood and affect.  Vitals reviewed.   Results for orders placed or performed in visit on 01/08/17 (from the past 24 hour(s))  POCT Influenza A/B     Status: Abnormal   Collection Time: 01/08/17  2:26 PM  Result Value Ref Range   Influenza A, POC Negative Negative   Influenza B, POC Positive (A) Negative  Assessment and Plan :  1. Cough - POCT Influenza A/B - guaiFENesin-codeine 100-10 MG/5ML syrup; Take 5 mLs by mouth at bedtime as needed for cough.  Dispense: 120 mL; Refill: 0 - benzonatate (TESSALON) 100 MG capsule; Take 1-2 capsules (100-200 mg total) by mouth 3 (three) times daily  as needed for cough.  Dispense: 40 capsule; Refill: 0  2. Nasal congestion - fluticasone (FLONASE) 50 MCG/ACT nasal spray; Place 2 sprays into both nostrils daily.  Dispense: 16 g; Refill: 0  3. Influenza B -Given educational material on the flu. Instructed to return to clinic if symptoms worsen, do not improve in 7-10 days, or as needed - oseltamivir (TAMIFLU) 75 MG capsule; Take 1 capsule (75 mg total) by mouth 2 (two) times daily.  Dispense: 10 capsule; Refill: 0  Tenna Delaine, PA-C  [facility] 01/08/2017 2:27 PM

## 2017-07-27 ENCOUNTER — Other Ambulatory Visit: Payer: Self-pay | Admitting: Physician Assistant

## 2017-07-27 ENCOUNTER — Ambulatory Visit: Payer: BC Managed Care – PPO | Admitting: Physician Assistant

## 2017-07-27 DIAGNOSIS — Z1231 Encounter for screening mammogram for malignant neoplasm of breast: Secondary | ICD-10-CM

## 2017-08-03 ENCOUNTER — Ambulatory Visit: Payer: BC Managed Care – PPO | Admitting: Physician Assistant

## 2017-08-14 ENCOUNTER — Ambulatory Visit
Admission: RE | Admit: 2017-08-14 | Discharge: 2017-08-14 | Disposition: A | Discharge status: Home / Self Care | Payer: BC Managed Care – PPO | Source: Ambulatory Visit | Attending: Physician Assistant | Admitting: Physician Assistant

## 2017-08-14 DIAGNOSIS — Z1231 Encounter for screening mammogram for malignant neoplasm of breast: Secondary | ICD-10-CM

## 2017-08-15 ENCOUNTER — Ambulatory Visit: Payer: BC Managed Care – PPO | Admitting: Family Medicine

## 2017-08-15 ENCOUNTER — Ambulatory Visit: Payer: BC Managed Care – PPO | Admitting: Physician Assistant

## 2017-11-16 DEATH — deceased

## 2017-12-06 ENCOUNTER — Ambulatory Visit: Payer: BC Managed Care – PPO | Admitting: Urgent Care

## 2017-12-06 ENCOUNTER — Encounter: Payer: Self-pay | Admitting: Urgent Care

## 2017-12-06 VITALS — BP 158/89 | HR 60 | Temp 97.7°F | Resp 16 | Ht 62.0 in | Wt 219.8 lb

## 2017-12-06 DIAGNOSIS — R0981 Nasal congestion: Secondary | ICD-10-CM

## 2017-12-06 DIAGNOSIS — R070 Pain in throat: Secondary | ICD-10-CM | POA: Diagnosis not present

## 2017-12-06 DIAGNOSIS — J018 Other acute sinusitis: Secondary | ICD-10-CM

## 2017-12-06 DIAGNOSIS — R05 Cough: Secondary | ICD-10-CM | POA: Diagnosis not present

## 2017-12-06 DIAGNOSIS — R059 Cough, unspecified: Secondary | ICD-10-CM

## 2017-12-06 DIAGNOSIS — H9202 Otalgia, left ear: Secondary | ICD-10-CM | POA: Diagnosis not present

## 2017-12-06 DIAGNOSIS — J3489 Other specified disorders of nose and nasal sinuses: Secondary | ICD-10-CM

## 2017-12-06 MED ORDER — AMOXICILLIN 500 MG PO CAPS: 500.0000 mg | ORAL_CAPSULE | Freq: Three times a day (TID) | ORAL | 0 refills | Status: DC

## 2017-12-06 MED ORDER — CETIRIZINE HCL 10 MG PO TABS: 10.0000 mg | ORAL_TABLET | Freq: Every day | ORAL | 11 refills | Status: DC

## 2017-12-06 MED ORDER — PSEUDOEPHEDRINE HCL 30 MG PO TABS: 30.0000 mg | ORAL_TABLET | Freq: Three times a day (TID) | ORAL | 0 refills | Status: DC | PRN

## 2017-12-06 NOTE — Progress Notes (Signed)
  MRN: [id number] DOB: 05-28-1956  Subjective:   Kailia Starry is a 62 y.o. female presenting for 2 week history of left ear pain, fullness that radiates into her throat. Has also had sinus congestion, sinus pain, throat pain, dry cough. Denies smoking cigarettes, alcohol use.  Review of Systems  Constitutional: Negative for fever.  HENT: Negative for ear discharge and tinnitus.   Respiratory: Negative for hemoptysis, sputum production, shortness of breath and wheezing.   Cardiovascular: Negative for chest pain.  Gastrointestinal: Negative for abdominal pain, nausea and vomiting.    Nate has a current medication list which includes the following prescription(s): aspirin ec, multiple vitamins-minerals, multiple vitamins-minerals, omega-3, probiotic product, and turmeric, and the following Facility-Administered Medications: sodium chloride. Also is allergic to ibuprofen.  Aniqua  has a past medical history of Allergy, Arthritis, Depression, History of blood transfusion, Spider bite, and UTERINE FIBROID (12/13/2006). Also  has a past surgical history that includes Shoulder surgery and Tubal ligation.  Objective:   Vitals: BP (!) 158/89   Pulse 60   Temp 97.7 F (36.5 C) (Oral)   Resp 16   Ht 5\' 2"  (1.575 m)   Wt 219 lb 12.8 oz (99.7 kg)   SpO2 100%   BMI 40.20 kg/m   BP Readings from Last 3 Encounters:  12/06/17 (!) 158/89  01/08/17 116/82  11/24/16 (!) 144/89    Physical Exam  Constitutional: She is oriented to person, place, and time. She appears well-developed and well-nourished.  HENT:  TM's intact bilaterally, no effusions or erythema. Nasal turbinates dry, erythematous, nasal passages patent. Bilateral maxillary sinus tenderness. Oropharynx with post-nasal drainage, mucous membranes moist.    Eyes: Right eye exhibits no discharge. Left eye exhibits no discharge.  Neck: Normal range of motion. Neck supple.  Cardiovascular: Normal rate, regular rhythm and intact distal  pulses. Exam reveals no gallop and no friction rub.  No murmur heard. Pulmonary/Chest: No respiratory distress. She has no wheezes. She has no rales.  Lymphadenopathy:    She has no cervical adenopathy.  Neurological: She is alert and oriented to person, place, and time.  Skin: Skin is warm and dry.  Psychiatric: She has a normal mood and affect.   Assessment and Plan :   Acute non-recurrent sinusitis of other sinus  Left ear pain  Sinus pain  Sinus congestion  Throat pain  Cough  Start amoxicillin to address sinusitis. Start Zyrtec, Sudafed, hydrate well. Return-to-clinic precautions discussed, patient verbalized understanding. Patient denies history of HTN. Will recheck in 4 weeks.  Jaynee Eagles, PA-C [facility] [place] [phone] 12/06/2017  10:59 AM

## 2017-12-06 NOTE — Progress Notes (Signed)
e

## 2017-12-06 NOTE — Patient Instructions (Addendum)
Set up an appointment in 4 weeks for a recheck on your blood pressure.   Sinusitis, Adult Sinusitis is soreness and inflammation of your sinuses. Sinuses are hollow spaces in the bones around your face. Your sinuses are located:  Around your eyes.  In the middle of your forehead.  Behind your nose.  In your cheekbones.  Your sinuses and nasal passages are lined with a stringy fluid (mucus). Mucus normally drains out of your sinuses. When your nasal tissues become inflamed or swollen, the mucus can become trapped or blocked so air cannot flow through your sinuses. This allows bacteria, viruses, and funguses to grow, which leads to infection. Sinusitis can develop quickly and last for 7?10 days (acute) or for more than 12 weeks (chronic). Sinusitis often develops after a cold. What are the causes? This condition is caused by anything that creates swelling in the sinuses or stops mucus from draining, including:  Allergies.  Asthma.  Bacterial or viral infection.  Abnormally shaped bones between the nasal passages.  Nasal growths that contain mucus (nasal polyps).  Narrow sinus openings.  Pollutants, such as chemicals or irritants in the air.  A foreign object stuck in the nose.  A fungal infection. This is rare.  What increases the risk? The following factors may make you more likely to develop this condition:  Having allergies or asthma.  Having had a recent cold or respiratory tract infection.  Having structural deformities or blockages in your nose or sinuses.  Having a weak immune system.  Doing a lot of swimming or diving.  Overusing nasal sprays.  Smoking.  What are the signs or symptoms? The main symptoms of this condition are pain and a feeling of pressure around the affected sinuses. Other symptoms include:  Upper toothache.  Earache.  Headache.  Bad breath.  Decreased sense of smell and taste.  A cough that may get worse at  night.  Fatigue.  Fever.  Thick drainage from your nose. The drainage is often green and it may contain pus (purulent).  Stuffy nose or congestion.  Postnasal drip. This is when extra mucus collects in the throat or back of the nose.  Swelling and warmth over the affected sinuses.  Sore throat.  Sensitivity to light.  How is this diagnosed? This condition is diagnosed based on symptoms, a medical history, and a physical exam. To find out if your condition is acute or chronic, your health care provider may:  Look in your nose for signs of nasal polyps.  Tap over the affected sinus to check for signs of infection.  View the inside of your sinuses using an imaging device that has a light attached (endoscope).  If your health care provider suspects that you have chronic sinusitis, you may also:  Be tested for allergies.  Have a sample of mucus taken from your nose (nasal culture) and checked for bacteria.  Have a mucus sample examined to see if your sinusitis is related to an allergy.  If your sinusitis does not respond to treatment and it lasts longer than 8 weeks, you may have an MRI or CT scan to check your sinuses. These scans also help to determine how severe your infection is. In rare cases, a bone biopsy may be done to rule out more serious types of fungal sinus disease. How is this treated? Treatment for sinusitis depends on the cause and whether your condition is chronic or acute. If a virus is causing your sinusitis, your symptoms will go  away on their own within 10 days. You may be given medicines to relieve your symptoms, including:  Topical nasal decongestants. They shrink swollen nasal passages and let mucus drain from your sinuses.  Antihistamines. These drugs block inflammation that is triggered by allergies. This can help to ease swelling in your nose and sinuses.  Topical nasal corticosteroids. These are nasal sprays that ease inflammation and swelling in  your nose and sinuses.  Nasal saline washes. These rinses can help to get rid of thick mucus in your nose.  If your condition is caused by bacteria, you will be given an antibiotic medicine. If your condition is caused by a fungus, you will be given an antifungal medicine. Surgery may be needed to correct underlying conditions, such as narrow nasal passages. Surgery may also be needed to remove polyps. Follow these instructions at home: Medicines  Take, use, or apply over-the-counter and prescription medicines only as told by your health care provider. These may include nasal sprays.  If you were prescribed an antibiotic medicine, take it as told by your health care provider. Do not stop taking the antibiotic even if you start to feel better. Hydrate and Humidify  Drink enough water to keep your urine clear or pale yellow. Staying hydrated will help to thin your mucus.  Use a cool mist humidifier to keep the humidity level in your home above 50%.  Inhale steam for 10-15 minutes, 3-4 times a day or as told by your health care provider. You can do this in the bathroom while a hot shower is running.  Limit your exposure to cool or dry air. Rest  Rest as much as possible.  Sleep with your head raised (elevated).  Make sure to get enough sleep each night. General instructions  Apply a warm, moist washcloth to your face 3-4 times a day or as told by your health care provider. This will help with discomfort.  Wash your hands often with soap and water to reduce your exposure to viruses and other germs. If soap and water are not available, use hand sanitizer.  Do not smoke. Avoid being around people who are smoking (secondhand smoke).  Keep all follow-up visits as told by your health care provider. This is important. Contact a health care provider if:  You have a fever.  Your symptoms get worse.  Your symptoms do not improve within 10 days. Get help right away if:  You have a  severe headache.  You have persistent vomiting.  You have pain or swelling around your face or eyes.  You have vision problems.  You develop confusion.  Your neck is stiff.  You have trouble breathing. This information is not intended to replace advice given to you by your health care provider. Make sure you discuss any questions you have with your health care provider. Document Released: 10/02/2005 Document Revised: 05/28/2016 Document Reviewed: 07/28/2015 Elsevier Interactive Patient Education  2018 [employer].     IF you received an x-ray today, you will receive an invoice from [facility]. Please contact [facility] at [phone] with questions or concerns regarding your invoice.   IF you received labwork today, you will receive an invoice from [facility]. Please contact [facility] at [phone] with questions or concerns regarding your invoice.   Our billing staff will not be able to assist you with questions regarding bills from these companies.  You will be contacted with the lab results as soon as they are available. The fastest way to get  your results is to activate your My Chart account. Instructions are located on the last page of this paperwork. If you have not heard from [place] regarding the results in 2 weeks, please contact this office.

## 2018-01-03 ENCOUNTER — Ambulatory Visit (INDEPENDENT_AMBULATORY_CARE_PROVIDER_SITE_OTHER): Payer: BC Managed Care – PPO | Admitting: Urgent Care

## 2018-01-03 ENCOUNTER — Encounter: Payer: Self-pay | Admitting: Urgent Care

## 2018-01-03 VITALS — BP 154/73 | HR 74 | Temp 97.5°F | Resp 18 | Ht 62.0 in | Wt 216.4 lb

## 2018-01-03 DIAGNOSIS — F329 Major depressive disorder, single episode, unspecified: Secondary | ICD-10-CM | POA: Diagnosis not present

## 2018-01-03 DIAGNOSIS — I10 Essential (primary) hypertension: Secondary | ICD-10-CM | POA: Diagnosis not present

## 2018-01-03 DIAGNOSIS — F4310 Post-traumatic stress disorder, unspecified: Secondary | ICD-10-CM | POA: Diagnosis not present

## 2018-01-03 DIAGNOSIS — F419 Anxiety disorder, unspecified: Secondary | ICD-10-CM | POA: Diagnosis not present

## 2018-01-03 DIAGNOSIS — R030 Elevated blood-pressure reading, without diagnosis of hypertension: Secondary | ICD-10-CM

## 2018-01-03 DIAGNOSIS — Z720 Tobacco use: Secondary | ICD-10-CM | POA: Diagnosis not present

## 2018-01-03 DIAGNOSIS — E669 Obesity, unspecified: Secondary | ICD-10-CM | POA: Diagnosis not present

## 2018-01-03 LAB — COMPREHENSIVE METABOLIC PANEL
ALK PHOS: 75 IU/L (ref 39–117)
ALT: 18 IU/L (ref 0–32)
AST: 16 IU/L (ref 0–40)
Albumin/Globulin Ratio: 1.7 (ref 1.2–2.2)
Albumin: 4.4 g/dL (ref 3.6–4.8)
BUN/Creatinine Ratio: 18 (ref 12–28)
BUN: 14 mg/dL (ref 8–27)
Bilirubin Total: 0.2 mg/dL (ref 0.0–1.2)
CO2: 19 mmol/L — AB (ref 20–29)
CREATININE: 0.76 mg/dL (ref 0.57–1.00)
Calcium: 9.4 mg/dL (ref 8.7–10.3)
Chloride: 110 mmol/L — ABNORMAL HIGH (ref 96–106)
GFR calc Af Amer: 97 mL/min/{1.73_m2} (ref >59)
GFR, EST NON AFRICAN AMERICAN: 84 mL/min/{1.73_m2} (ref >59)
GLOBULIN, TOTAL: 2.6 g/dL (ref 1.5–4.5)
GLUCOSE: 100 mg/dL — AB (ref 65–99)
Potassium: 4.3 mmol/L (ref 3.5–5.2)
Sodium: 143 mmol/L (ref 134–144)
Total Protein: 7 g/dL (ref 6.0–8.5)

## 2018-01-03 LAB — LIPID PANEL
CHOL/HDL RATIO: 2.2 ratio (ref 0.0–4.4)
CHOLESTEROL TOTAL: 195 mg/dL (ref 100–199)
HDL: 87 mg/dL (ref >39)
LDL CALC: 93 mg/dL (ref 0–99)
TRIGLYCERIDES: 73 mg/dL (ref 0–149)
VLDL CHOLESTEROL CAL: 15 mg/dL (ref 5–40)

## 2018-01-03 MED ORDER — ALPRAZOLAM 0.25 MG PO TABS: 0.2500 mg | ORAL_TABLET | Freq: Two times a day (BID) | ORAL | 0 refills | Status: DC | PRN

## 2018-01-03 MED ORDER — FLUOXETINE HCL 20 MG PO TABS: 20.0000 mg | ORAL_TABLET | Freq: Every day | ORAL | 1 refills | Status: DC

## 2018-01-03 MED ORDER — AMLODIPINE BESYLATE 5 MG PO TABS: 5.0000 mg | ORAL_TABLET | Freq: Every day | ORAL | 3 refills | Status: DC
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
## 2018-01-03 NOTE — Patient Instructions (Addendum)
Stress and Stress Management Stress is a normal reaction to life events. It is what you feel when life demands more than you are used to or more than you can handle. Some stress can be useful. For example, the stress reaction can help you catch the last bus of the day, study for a test, or meet a deadline at work. But stress that occurs too often or for too long can cause problems. It can affect your emotional health and interfere with relationships and normal daily activities. Too much stress can weaken your immune system and increase your risk for physical illness. If you already have a medical problem, stress can make it worse. What are the causes? All sorts of life events may cause stress. An event that causes stress for one person may not be stressful for another person. Major life events commonly cause stress. These may be positive or negative. Examples include losing your job, moving into a new home, getting married, having a baby, or losing a loved one. Less obvious life events may also cause stress, especially if they occur day after day or in combination. Examples include working long hours, driving in traffic, caring for children, being in debt, or being in a difficult relationship. What are the signs or symptoms? Stress may cause emotional symptoms including, the following:  Anxiety. This is feeling worried, afraid, on edge, overwhelmed, or out of control.  Anger. This is feeling irritated or impatient.  Depression. This is feeling sad, down, helpless, or guilty.  Difficulty focusing, remembering, or making decisions.  Stress may cause physical symptoms, including the following:  Aches and pains. These may affect your head, neck, back, stomach, or other areas of your body.  Tight muscles or clenched jaw.  Low energy or trouble sleeping.  Stress may cause unhealthy behaviors, including the following:  Eating to feel better (overeating) or skipping meals.  Sleeping too little,  too much, or both.  Working too much or putting off tasks (procrastination).  Smoking, drinking alcohol, or using drugs to feel better.  How is this diagnosed? Stress is diagnosed through an assessment by your health care provider. Your health care provider will ask questions about your symptoms and any stressful life events.Your health care provider will also ask about your medical history and may order blood tests or other tests. Certain medical conditions and medicine can cause physical symptoms similar to stress. Mental illness can cause emotional symptoms and unhealthy behaviors similar to stress. Your health care provider may refer you to a mental health professional for further evaluation. How is this treated? Stress management is the recommended treatment for stress.The goals of stress management are reducing stressful life events and coping with stress in healthy ways. Techniques for reducing stressful life events include the following:  Stress identification. Self-monitor for stress and identify what causes stress for you. These skills may help you to avoid some stressful events.  Time management. Set your priorities, keep a calendar of events, and learn to say "no." These tools can help you avoid making too many commitments.  Techniques for coping with stress include the following:  Rethinking the problem. Try to think realistically about stressful events rather than ignoring them or overreacting. Try to find the positives in a stressful situation rather than focusing on the negatives.  Exercise. Physical exercise can release both physical and emotional tension. The key is to find a form of exercise you enjoy and do it regularly.  Relaxation techniques. These relax the body and  mind. Examples include yoga, meditation, tai chi, biofeedback, deep breathing, progressive muscle relaxation, listening to music, being out in nature, journaling, and other hobbies. Again, the key is to find  one or more that you enjoy and can do regularly.  Healthy lifestyle. Eat a balanced diet, get plenty of sleep, and do not smoke. Avoid using alcohol or drugs to relax.  Strong support network. Spend time with family, friends, or other people you enjoy being around.Express your feelings and talk things over with someone you trust.  Counseling or talktherapy with a mental health professional may be helpful if you are having difficulty managing stress on your own. Medicine is typically not recommended for the treatment of stress.Talk to your health care provider if you think you need medicine for symptoms of stress. Follow these instructions at home:  Keep all follow-up visits as directed by your health care provider.  Take all medicines as directed by your health care provider. Contact a health care provider if:  Your symptoms get worse or you start having new symptoms.  You feel overwhelmed by your problems and can no longer manage them on your own. Get help right away if:  You feel like hurting yourself or someone else. This information is not intended to replace advice given to you by your health care provider. Make sure you discuss any questions you have with your health care provider. Document Released: 03/28/2001 Document Revised: 03/09/2016 Document Reviewed: 05/27/2013 Elsevier Interactive Patient Education  2017 Else[employer] Inc[employer]     Hypertension Hypertension is another name for high blood pressure. High blood pressure forces your heart to work harder to pump blood. This can cause problems over time. There are two numbers in a blood pressure reading. There is a top number (systolic) over a bottom number (diastolic). It is best to have a blood pressure below 120/80. Healthy choices can help lower your blood pressure. You may need medicine to help lower your blood pressure if:  Your blood pressure cannot be lowered with healthy choices.  Your blood pressure is higher than  130/80.  Follow these instructions at home: Eating and drinking  If directed, follow the DASH eating plan. This diet includes: ? Filling half of your plate at each meal with fruits and vegetables. ? Filling one quarter of your plate at each meal with whole grains. Whole grains include whole wheat pasta, brown rice, and whole grain bread. ? Eating or drinking low-fat dairy products, such as skim milk or low-fat yogurt. ? Filling one quarter of your plate at each meal with low-fat (lean) proteins. Low-fat proteins include fish, skinless chicken, eggs, beans, and tofu. ? Avoiding fatty meat, cured and processed meat, or chicken with skin. ? Avoiding premade or processed food.  Eat less than 1,500 mg of salt (sodium) a day.  Limit alcohol use to no more than 1 drink a day for nonpregnant women and 2 drinks a day for men. One drink equals 12 oz of beer, 5 oz of wine, or 1 oz of hard liquor. Lifestyle  Work with your doctor to stay at a healthy weight or to lose weight. Ask your doctor what the best weight is for you.  Get at least 30 minutes of exercise that causes your heart to beat faster (aerobic exercise) most days of the week. This may include walking, swimming, or biking.  Get at least 30 minutes of exercise that strengthens your muscles (resistance exercise) at least 3 days a week. This may include lifting weights  or pilates.  Do not use any products that contain nicotine or tobacco. This includes cigarettes and e-cigarettes. If you need help quitting, ask your doctor.  Check your blood pressure at home as told by your doctor.  Keep all follow-up visits as told by your doctor. This is important. Medicines  Take over-the-counter and prescription medicines only as told by your doctor. Follow directions carefully.  Do not skip doses of blood pressure medicine. The medicine does not work as well if you skip doses. Skipping doses also puts you at risk for problems.  Ask your doctor  about side effects or reactions to medicines that you should watch for. Contact a doctor if:  You think you are having a reaction to the medicine you are taking.  You have headaches that keep coming back (recurring).  You feel dizzy.  You have swelling in your ankles.  You have trouble with your vision. Get help right away if:  You get a very bad headache.  You start to feel confused.  You feel weak or numb.  You feel faint.  You get very bad pain in your: ? Chest. ? Belly (abdomen).  You throw up (vomit) more than once.  You have trouble breathing. Summary  Hypertension is another name for high blood pressure.  Making healthy choices can help lower blood pressure. If your blood pressure cannot be controlled with healthy choices, you may need to take medicine. This information is not intended to replace advice given to you by your health care provider. Make sure you discuss any questions you have with your health care provider. Document Released: 03/20/2008 Document Revised: 08/30/2016 Document Reviewed: 08/30/2016 Elsevier Interactive Patient Education  2018 [employer].   Fluoxetine capsules or tablets (Depression/Mood Disorders) What is this medicine? FLUOXETINE (floo OX e teen) belongs to a class of drugs known as selective serotonin reuptake inhibitors (SSRIs). It helps to treat mood problems such as depression, obsessive compulsive disorder, and panic attacks. It can also treat certain eating disorders. This medicine may be used for other purposes; ask your health care provider or pharmacist if you have questions. COMMON BRAND NAME(S): Prozac What should I tell my health care provider before I take this medicine? They need to know if you have any of these conditions: -bipolar disorder or a family history of bipolar disorder -bleeding disorders -glaucoma -heart disease -liver disease -low levels of sodium in the blood -seizures -suicidal thoughts, plans, or  attempt; a previous suicide attempt by you or a family member -take MAOIs like Carbex, Eldepryl, Marplan, Nardil, and Parnate -take medicines that treat or prevent blood clots -thyroid disease -an unusual or allergic reaction to fluoxetine, other medicines, foods, dyes, or preservatives -pregnant or trying to get pregnant -breast-feeding How should I use this medicine? Take this medicine by mouth with a glass of water. Follow the directions on the prescription label. You can take this medicine with or without food. Take your medicine at regular intervals. Do not take it more often than directed. Do not stop taking this medicine suddenly except upon the advice of your doctor. Stopping this medicine too quickly may cause serious side effects or your condition may worsen. A special MedGuide will be given to you by the pharmacist with each prescription and refill. Be sure to read this information carefully each time. Talk to your pediatrician regarding the use of this medicine in children. While this drug may be prescribed for children as young as 7 years for selected conditions, precautions  do apply. Overdosage: If you think you have taken too much of this medicine contact a poison control center or emergency room at once. NOTE: This medicine is only for you. Do not share this medicine with others. What if I miss a dose? If you miss a dose, skip the missed dose and go back to your regular dosing schedule. Do not take double or extra doses. What may interact with this medicine? Do not take this medicine with any of the following medications: -other medicines containing fluoxetine, like Sarafem or Symbyax -cisapride -linezolid -MAOIs like Carbex, Eldepryl, Marplan, Nardil, and Parnate -methylene blue (injected into a vein) -pimozide -thioridazine This medicine may also interact with the following medications: -alcohol -amphetamines -aspirin and aspirin-like medicines -carbamazepine -certain  medicines for depression, anxiety, or psychotic disturbances -certain medicines for migraine headaches like almotriptan, eletriptan, frovatriptan, naratriptan, rizatriptan, sumatriptan, zolmitriptan -digoxin -diuretics -fentanyl -flecainide -furazolidone -isoniazid -lithium -medicines for sleep -medicines that treat or prevent blood clots like warfarin, enoxaparin, and dalteparin -NSAIDs, medicines for pain and inflammation, like ibuprofen or naproxen -phenytoin -procarbazine -propafenone -rasagiline -ritonavir -supplements like St. John's wort, kava kava, valerian -tramadol -tryptophan -vinblastine This list may not describe all possible interactions. Give your health care provider a list of all the medicines, herbs, non-prescription drugs, or dietary supplements you use. Also tell them if you smoke, drink alcohol, or use illegal drugs. Some items may interact with your medicine. What should I watch for while using this medicine? Tell your doctor if your symptoms do not get better or if they get worse. Visit your doctor or health care professional for regular checks on your progress. Because it may take several weeks to see the full effects of this medicine, it is important to continue your treatment as prescribed by your doctor. Patients and their families should watch out for new or worsening thoughts of suicide or depression. Also watch out for sudden changes in feelings such as feeling anxious, agitated, panicky, irritable, hostile, aggressive, impulsive, severely restless, overly excited and hyperactive, or not being able to sleep. If this happens, especially at the beginning of treatment or after a change in dose, call your health care professional. Dennis Bast may get drowsy or dizzy. Do not drive, use machinery, or do anything that needs mental alertness until you know how this medicine affects you. Do not stand or sit up quickly, especially if you are an older patient. This reduces the  risk of dizzy or fainting spells. Alcohol may interfere with the effect of this medicine. Avoid alcoholic drinks. Your mouth may get dry. Chewing sugarless gum or sucking hard candy, and drinking plenty of water may help. Contact your doctor if the problem does not go away or is severe. This medicine may affect blood sugar levels. If you have diabetes, check with your doctor or health care professional before you change your diet or the dose of your diabetic medicine. What side effects may I notice from receiving this medicine? Side effects that you should report to your doctor or health care professional as soon as possible: -allergic reactions like skin rash, itching or hives, swelling of the face, lips, or tongue -anxious -black, tarry stools -breathing problems -changes in vision -confusion -elevated mood, decreased need for sleep, racing thoughts, impulsive behavior -eye pain -fast, irregular heartbeat -feeling faint or lightheaded, falls -feeling agitated, angry, or irritable -hallucination, loss of contact with reality -loss of balance or coordination -loss of memory -painful or prolonged erections -restlessness, pacing, inability to keep still -seizures -stiff  muscles -suicidal thoughts or other mood changes -trouble sleeping -unusual bleeding or bruising -unusually weak or tired -vomiting Side effects that usually do not require medical attention (report to your doctor or health care professional if they continue or are bothersome): -change in appetite or weight -change in sex drive or performance -diarrhea -dry mouth -headache -increased sweating -nausea -tremors This list may not describe all possible side effects. Call your doctor for medical advice about side effects. You may report side effects to FDA at 1-800-FDA-1088. Where should I keep my medicine? Keep out of the reach of children. Store at room temperature between 15 and 30 degrees C (59 and 86 degrees F).  Throw away any unused medicine after the expiration date. NOTE: This sheet is a summary. It may not cover all possible information. If you have questions about this medicine, talk to your doctor, pharmacist, or health care provider.  2018 Elsevier/Gold Standard (2016-03-04 15:55:27)   Alprazolam tablets What is this medicine? ALPRAZOLAM (al PRAY zoe lam) is a benzodiazepine. It is used to treat anxiety and panic attacks. This medicine may be used for other purposes; ask your health care provider or pharmacist if you have questions. COMMON BRAND NAME(S): Xanax What should I tell my health care provider before I take this medicine? They need to know if you have any of these conditions: -an alcohol or drug abuse problem -bipolar disorder, depression, psychosis or other mental health conditions -glaucoma -kidney or liver disease -lung or breathing disease -myasthenia gravis -Parkinson's disease -porphyria -seizures or a history of seizures -suicidal thoughts -an unusual or allergic reaction to alprazolam, other benzodiazepines, foods, dyes, or preservatives -pregnant or trying to get pregnant -breast-feeding How should I use this medicine? Take this medicine by mouth with a glass of water. Follow the directions on the prescription label. Take your medicine at regular intervals. Do not take it more often than directed. Do not stop taking except on your doctor's advice. A special MedGuide will be given to you by the pharmacist with each prescription and refill. Be sure to read this information carefully each time. Talk to your pediatrician regarding the use of this medicine in children. Special care may be needed. Overdosage: If you think you have taken too much of this medicine contact a poison control center or emergency room at once. NOTE: This medicine is only for you. Do not share this medicine with others. What if I miss a dose? If you miss a dose, take it as soon as you can. If it  is almost time for your next dose, take only that dose. Do not take double or extra doses. What may interact with this medicine? Do not take this medicine with any of the following medications: -certain antiviral medicines for HIV or AIDS like delavirdine, indinavir -certain medicines for fungal infections like ketoconazole and itraconazole -narcotic medicines for cough -sodium oxybate This medicine may also interact with the following medications: -alcohol -antihistamines for allergy, cough and cold -certain antibiotics like clarithromycin, erythromycin, isoniazid, rifampin, rifapentine, rifabutin, and troleandomycin -certain medicines for blood pressure, heart disease, irregular heart beat -certain medicines for depression, like amitriptyline, fluoxetine, sertraline -certain medicines for seizures like carbamazepine, oxcarbazepine, phenobarbital, phenytoin, primidone -cimetidine -cyclosporine -female hormones, like estrogens or progestins and birth control pills, patches, rings, or injections -general anesthetics like halothane, isoflurane, methoxyflurane, propofol -grapefruit juice -local anesthetics like lidocaine, pramoxine, tetracaine -medicines that relax muscles for surgery -narcotic medicines for pain -other antiviral medicines for HIV or AIDS -phenothiazines like chlorpromazine,  mesoridazine, prochlorperazine, thioridazine This list may not describe all possible interactions. Give your health care provider a list of all the medicines, herbs, non-prescription drugs, or dietary supplements you use. Also tell them if you smoke, drink alcohol, or use illegal drugs. Some items may interact with your medicine. What should I watch for while using this medicine? Tell your doctor or health care professional if your symptoms do not start to get better or if they get worse. Do not stop taking except on your doctor's advice. You may develop a severe reaction. Your doctor will tell you how  much medicine to take. You may get drowsy or dizzy. Do not drive, use machinery, or do anything that needs mental alertness until you know how this medicine affects you. To reduce the risk of dizzy and fainting spells, do not stand or sit up quickly, especially if you are an older patient. Alcohol may increase dizziness and drowsiness. Avoid alcoholic drinks. If you are taking another medicine that also causes drowsiness, you may have more side effects. Give your health care provider a list of all medicines you use. Your doctor will tell you how much medicine to take. Do not take more medicine than directed. Call emergency for help if you have problems breathing or unusual sleepiness. What side effects may I notice from receiving this medicine? Side effects that you should report to your doctor or health care professional as soon as possible: -allergic reactions like skin rash, itching or hives, swelling of the face, lips, or tongue -breathing problems -confusion -loss of balance or coordination -signs and symptoms of low blood pressure like dizziness; feeling faint or lightheaded, falls; unusually weak or tired -suicidal thoughts or other mood changes Side effects that usually do not require medical attention (report to your doctor or health care professional if they continue or are bothersome): -dizziness -dry mouth -nausea, vomiting -tiredness This list may not describe all possible side effects. Call your doctor for medical advice about side effects. You may report side effects to FDA at 1-800-FDA-1088. Where should I keep my medicine? Keep out of the reach of children. This medicine can be abused. Keep your medicine in a safe place to protect it from theft. Do not share this medicine with anyone. Selling or giving away this medicine is dangerous and against the law. Store at room temperature between 20 and 25 degrees C (68 and 77 degrees F). This medicine may cause accidental overdose and  death if taken by other adults, children, or pets. Mix any unused medicine with a substance like cat litter or coffee grounds. Then throw the medicine away in a sealed container like a sealed bag or a coffee can with a lid. Do not use the medicine after the expiration date. NOTE: This sheet is a summary. It may not cover all possible information. If you have questions about this medicine, talk to your doctor, pharmacist, or health care provider.  2018 Elsevier/Gold Standard (2015-07-01 13:47:25)    IF you received an x-ray today, you will receive an invoice from [facility]. Please contact [facility] at [phone] with questions or concerns regarding your invoice.   IF you received labwork today, you will receive an invoice from [facility]. Please contact [facility] at [phone] with questions or concerns regarding your invoice.   Our billing staff will not be able to assist you with questions regarding bills from these companies.  You will be contacted with the lab results as soon as they are available. The fastest  way to get your results is to activate your My Chart account. Instructions are located on the last page of this paperwork. If you have not heard from [place] regarding the results in 2 weeks, please contact this office.

## 2018-01-04 ENCOUNTER — Telehealth: Payer: Self-pay | Admitting: Urgent Care

## 2018-01-04 NOTE — Telephone Encounter (Signed)
Copied from [place] [phone]. Topic: Quick Communication - See Telephone Encounter >> Jan 04, 2018  3:23 PM Ether Griffins B wrote: CRM for notification. See Telephone encounter for: 01/04/18. Pt saw Bess Harvest yesterday 01/03/18 and was prescribed several medications she went and picked them up yesterday. Today she received a call from the pharmacy that an additional medication was called in. Pt wants to know why Bess Harvest didn't call her and tell her about this, and that she doesn't want to be on all these different medications if they are not neccessary.

## 2018-01-10 NOTE — Telephone Encounter (Signed)
See telephone encounter below; respond and contact pt

## 2018-01-10 NOTE — Telephone Encounter (Signed)
Phone call to patient. She states that she was able to pick up amlodipine and xanax, but has not picked up fluoxetine. She states "I don't know what that's for". Reviewed last visit instructions and plan from 01/03/18 visit with patient. She is agreeable to starting fluoxetine per plan, verbalizes understanding.   Phone call to pharmacy, spoke with Ronalee Belts. He states he has medication on file, will get this ready for patient.

## 2018-02-07 ENCOUNTER — Encounter: Payer: Self-pay | Admitting: Urgent Care

## 2018-02-07 ENCOUNTER — Ambulatory Visit: Payer: BC Managed Care – PPO | Admitting: Urgent Care

## 2018-02-07 VITALS — BP 155/89 | HR 61 | Temp 97.7°F | Ht 62.0 in | Wt 220.4 lb

## 2018-02-07 DIAGNOSIS — F32A Depression, unspecified: Secondary | ICD-10-CM

## 2018-02-07 DIAGNOSIS — R030 Elevated blood-pressure reading, without diagnosis of hypertension: Secondary | ICD-10-CM

## 2018-02-07 DIAGNOSIS — F329 Major depressive disorder, single episode, unspecified: Secondary | ICD-10-CM | POA: Diagnosis not present

## 2018-02-07 DIAGNOSIS — I10 Essential (primary) hypertension: Secondary | ICD-10-CM

## 2018-02-07 DIAGNOSIS — R457 State of emotional shock and stress, unspecified: Secondary | ICD-10-CM

## 2018-02-07 NOTE — Progress Notes (Signed)
    MRN: [id number] DOB: 02-17-1956  Subjective:   Doris Green is a 62 y.o. female presenting for follow up on Hypertension. Currently managed with amlodipine. Patient is not checking blood pressure at home. Tries to avoid salt in diet, is not exercising. Reports feeling bloated with amlodipine. Denies dizziness, chronic headache, blurred vision, chest pain, shortness of breath, heart racing, palpitations, nausea, vomiting, abdominal pain, hematuria. Denies smoking cigarettes or drinking alcohol.   Doris Green has a current medication list which includes the following prescription(s): alprazolam, amlodipine, cetirizine, fluoxetine, multiple vitamins-minerals, multiple vitamins-minerals, omega-3, probiotic product, and turmeric. Also is allergic to ibuprofen.  Doris Green  has a past medical history of Allergy, Arthritis, Depression, History of blood transfusion, Spider bite, and UTERINE FIBROID (12/13/2006). Also  has a past surgical history that includes Shoulder surgery and Tubal ligation.  Objective:   Vitals: BP (!) 155/89 (BP Location: Left Arm, Patient Position: Sitting, Cuff Size: Large)   Pulse 61   Temp 97.7 F (36.5 C) (Oral)   Ht 5\' 2"  (1.575 m)   Wt 220 lb 6.4 oz (100 kg)   SpO2 100%   BMI 40.31 kg/m   BP Readings from Last 3 Encounters:  02/07/18 (!) 155/89  01/03/18 (!) 154/73  12/06/17 (!) 158/89    Physical Exam  Constitutional: She is oriented to person, place, and time. She appears well-developed and well-nourished.  HENT:  Mouth/Throat: Oropharynx is clear and moist.  Eyes: No scleral icterus.  Cardiovascular: Normal rate, regular rhythm and intact distal pulses. Exam reveals no gallop and no friction rub.  No murmur heard. Pulmonary/Chest: No respiratory distress. She has no wheezes. She has no rales.  Musculoskeletal: She exhibits edema (trace bilaterally).  Neurological: She is alert and oriented to person, place, and time.  Skin: Skin is warm and dry.    Psychiatric: She has a normal mood and affect.   Assessment and Plan :   Essential hypertension  Elevated blood pressure reading  Emotional stress  Depression, unspecified depression type  Patient is doing better emotionally, states that her situation with her daughter is improved and is no longer living with her.  She is not agreeable to changing her blood pressure medications at this time even though she is still uncontrolled.  She states that she is to try to eat a healthier diet and exercise more.  She will follow-up in a month and will reconsider increasing her dosage of amlodipine at that point.  Jaynee Eagles, PA-C [facility] [phone] 02/07/2018  5:19 PM

## 2018-02-07 NOTE — Patient Instructions (Addendum)
[facility] [address] [place], [place] [address]   Burnard Leigh [phone]  Horton Finer [phone]  Everardo Beals [phone]   [facility] ([address] [place]) - [phone]  [facility] [place]) - [place] [facility] - [phone]  [facility] [place] - [phone]  [facility]  - [phone] (do not file insurance)     We will stick with amlodipine for now but I need to see you back in 1 month.     IF you received an x-ray today, you will receive an invoice from [facility]. Please contact [facility] at [phone] with questions or concerns regarding your invoice.   IF you received labwork today, you will receive an invoice from [facility]. Please contact [facility] at [phone] with questions or concerns regarding your invoice.   Our billing staff will not be able to assist you with questions regarding bills from these companies.  You will be contacted with the lab results as soon as they are available. The fastest way to get your results is to activate your My Chart account. Instructions are located on the last page of this paperwork. If you have not heard from [place] regarding the results in 2 weeks, please contact this office.

## 2018-03-05 ENCOUNTER — Other Ambulatory Visit: Payer: Self-pay

## 2018-03-05 ENCOUNTER — Encounter: Payer: Self-pay | Admitting: Urgent Care

## 2018-03-05 ENCOUNTER — Ambulatory Visit: Payer: BC Managed Care – PPO | Admitting: Urgent Care

## 2018-03-05 VITALS — BP 150/90 | HR 66 | Temp 97.6°F | Resp 18 | Ht 62.0 in | Wt 217.4 lb

## 2018-03-05 DIAGNOSIS — E669 Obesity, unspecified: Secondary | ICD-10-CM | POA: Diagnosis not present

## 2018-03-05 DIAGNOSIS — R030 Elevated blood-pressure reading, without diagnosis of hypertension: Secondary | ICD-10-CM

## 2018-03-05 DIAGNOSIS — I10 Essential (primary) hypertension: Secondary | ICD-10-CM | POA: Insufficient documentation

## 2018-03-05 DIAGNOSIS — Z6839 Body mass index (BMI) 39.0-39.9, adult: Secondary | ICD-10-CM | POA: Diagnosis not present

## 2018-03-05 MED ORDER — HYDROCHLOROTHIAZIDE 12.5 MG PO TABS: 12.5000 mg | ORAL_TABLET | Freq: Every day | ORAL | 1 refills | Status: DC

## 2018-03-05 NOTE — Patient Instructions (Addendum)
Please check your blood pressure once weekly using a cuff over your left upper arm. Try to check it around the same time each day that you do check it. Make sure you rest for 5-10 minutes in a seated position with your arm at about heart level. Come back to our clinic if your blood pressure is consistently above 732'K systolic (top number). Otherwise, we can have you follow up in 3 months.      [facility] [address] [place], [place] [address]   Burnard Leigh [phone]  Horton Finer [phone]  Everardo Beals [phone]   [facility] ([address] [place]) - [phone]  [facility] [place]) - [place] [facility] - [phone]  [facility] [place] - [phone]  [facility]  - [phone] (do not file insurance)       Hypertension Hypertension, commonly called high blood pressure, is when the force of blood pumping through the arteries is too strong. The arteries are the blood vessels that carry blood from the heart throughout the body. Hypertension forces the heart to work harder to pump blood and may cause arteries to become narrow or stiff. Having untreated or uncontrolled hypertension can cause heart attacks, strokes, kidney disease, and other problems. A blood pressure reading consists of a higher number over a lower number. Ideally, your blood pressure should be below 120/80. The first ("top") number is called the systolic pressure. It is a measure of the pressure in your arteries as your heart beats. The second ("bottom") number is called the diastolic pressure. It is a measure of the pressure in your arteries as the heart relaxes. What are the causes? The cause of this condition is not known. What increases the risk? Some risk factors for high blood pressure are  under your control. Others are not. Factors you can change  Smoking.  Having type 2 diabetes mellitus, high cholesterol, or both.  Not getting enough exercise or physical activity.  Being overweight.  Having too much fat, sugar, calories, or salt (sodium) in your diet.  Drinking too much alcohol. Factors that are difficult or impossible to change  Having chronic kidney disease.  Having a family history of high blood pressure.  Age. Risk increases with age.  Race. You may be at higher risk if you are African-American.  Gender. Men are at higher risk than women before age 48. After age 44, women are at higher risk than men.  Having obstructive sleep apnea.  Stress. What are the signs or symptoms? Extremely high blood pressure (hypertensive crisis) may cause:  Headache.  Anxiety.  Shortness of breath.  Nosebleed.  Nausea and vomiting.  Severe chest pain.  Jerky movements you cannot control (seizures).  How is this diagnosed? This condition is diagnosed by measuring your blood pressure while you are seated, with your arm resting on a surface. The cuff of the blood pressure monitor will be placed directly against the skin of your upper arm at the level of your heart. It should be measured at least twice using the same arm. Certain conditions can cause a difference in blood pressure between your right and left arms. Certain factors can cause blood pressure readings to be lower or higher than normal (elevated) for a short period of time:  When your blood pressure is higher when you are in a health care provider's office than when you are at home,  this is called white coat hypertension. Most people with this condition do not need medicines.  When your blood pressure is higher at home than when you are in a health care provider's office, this is called masked hypertension. Most people with this condition may need medicines to control blood pressure.  If you have a high  blood pressure reading during one visit or you have normal blood pressure with other risk factors:  You may be asked to return on a different day to have your blood pressure checked again.  You may be asked to monitor your blood pressure at home for 1 week or longer.  If you are diagnosed with hypertension, you may have other blood or imaging tests to help your health care provider understand your overall risk for other conditions. How is this treated? This condition is treated by making healthy lifestyle changes, such as eating healthy foods, exercising more, and reducing your alcohol intake. Your health care provider may prescribe medicine if lifestyle changes are not enough to get your blood pressure under control, and if:  Your systolic blood pressure is above 130.  Your diastolic blood pressure is above 80.  Your personal target blood pressure may vary depending on your medical conditions, your age, and other factors. Follow these instructions at home: Eating and drinking  Eat a diet that is high in fiber and potassium, and low in sodium, added sugar, and fat. An example eating plan is called the DASH (Dietary Approaches to Stop Hypertension) diet. To eat this way: ? Eat plenty of fresh fruits and vegetables. Try to fill half of your plate at each meal with fruits and vegetables. ? Eat whole grains, such as whole wheat pasta, brown rice, or whole grain bread. Fill about one quarter of your plate with whole grains. ? Eat or drink low-fat dairy products, such as skim milk or low-fat yogurt. ? Avoid fatty cuts of meat, processed or cured meats, and poultry with skin. Fill about one quarter of your plate with lean proteins, such as fish, chicken without skin, beans, eggs, and tofu. ? Avoid premade and processed foods. These tend to be higher in sodium, added sugar, and fat.  Reduce your daily sodium intake. Most people with hypertension should eat less than 1,500 mg of sodium a  day.  Limit alcohol intake to no more than 1 drink a day for nonpregnant women and 2 drinks a day for men. One drink equals 12 oz of beer, 5 oz of wine, or 1 oz of hard liquor. Lifestyle  Work with your health care provider to maintain a healthy body weight or to lose weight. Ask what an ideal weight is for you.  Get at least 30 minutes of exercise that causes your heart to beat faster (aerobic exercise) most days of the week. Activities may include walking, swimming, or biking.  Include exercise to strengthen your muscles (resistance exercise), such as pilates or lifting weights, as part of your weekly exercise routine. Try to do these types of exercises for 30 minutes at least 3 days a week.  Do not use any products that contain nicotine or tobacco, such as cigarettes and e-cigarettes. If you need help quitting, ask your health care provider.  Monitor your blood pressure at home as told by your health care provider.  Keep all follow-up visits as told by your health care provider. This is important. Medicines  Take over-the-counter and prescription medicines only as told by your health care provider. Follow directions  carefully. Blood pressure medicines must be taken as prescribed.  Do not skip doses of blood pressure medicine. Doing this puts you at risk for problems and can make the medicine less effective.  Ask your health care provider about side effects or reactions to medicines that you should watch for. Contact a health care provider if:  You think you are having a reaction to a medicine you are taking.  You have headaches that keep coming back (recurring).  You feel dizzy.  You have swelling in your ankles.  You have trouble with your vision. Get help right away if:  You develop a severe headache or confusion.  You have unusual weakness or numbness.  You feel faint.  You have severe pain in your chest or abdomen.  You vomit repeatedly.  You have trouble  breathing. Summary  Hypertension is when the force of blood pumping through your arteries is too strong. If this condition is not controlled, it may put you at risk for serious complications.  Your personal target blood pressure may vary depending on your medical conditions, your age, and other factors. For most people, a normal blood pressure is less than 120/80.  Hypertension is treated with lifestyle changes, medicines, or a combination of both. Lifestyle changes include weight loss, eating a healthy, low-sodium diet, exercising more, and limiting alcohol. This information is not intended to replace advice given to you by your health care provider. Make sure you discuss any questions you have with your health care provider. Document Released: 10/02/2005 Document Revised: 08/30/2016 Document Reviewed: 08/30/2016 Elsevier Interactive Patient Education  2018 [employer].    IF you received an x-ray today, you will receive an invoice from [facility]. Please contact [facility] Radiology at [phone] with questions or concerns regarding your invoice.   IF you received labwork today, you will receive an invoice from [facility]. Please contact [facility] at [phone] with questions or concerns regarding your invoice.   Our billing staff will not be able to assist you with questions regarding bills from these companies.  You will be contacted with the lab results as soon as they are available. The fastest way to get your results is to activate your My Chart account. Instructions are located on the last page of this paperwork. If you have not heard from [place] regarding the results in 2 weeks, please contact this office.
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
## 2018-05-25 ENCOUNTER — Other Ambulatory Visit: Payer: Self-pay

## 2018-05-25 ENCOUNTER — Ambulatory Visit (HOSPITAL_COMMUNITY)
Admission: EM | Admit: 2018-05-25 | Discharge: 2018-05-25 | Disposition: A | Discharge status: Home / Self Care | Payer: BC Managed Care – PPO | Attending: Internal Medicine | Admitting: Internal Medicine

## 2018-05-25 DIAGNOSIS — M25511 Pain in right shoulder: Secondary | ICD-10-CM

## 2018-05-25 NOTE — Discharge Instructions (Addendum)
Go to nearest emergency room if any neurological deficits arise.

## 2018-05-25 NOTE — ED Triage Notes (Signed)
Pt presents today with right arm, shoulder, and face pain. States it has been going on on and off for a week. Feels like a burning sensation.

## 2018-05-25 NOTE — ED Provider Notes (Addendum)
[facility] [place]    CSN: [id number] Arrival date & time: 05/25/18  [place]     History   Chief Complaint Chief Complaint  Patient presents with  . Shoulder Pain    HPI Doris Green is a 62 y.o. female.   62 y.o. female presents with what is described as "hotness" to right lateral aspect of face radiating down her neck to the right arm x5 days with swelling to rest x2 days.  Condition is acute in nature.  Condition is made it better by nothing.  Condition is made worse by nothing.  Patient denies any treatment prior to arrival at this facility.  Vision states that the "hotness" similar to her postsurgical rotator cuff repair.  Patient denies any rashes or neurological deficit . no neurological deficits noted at time of exam.  Patient declines steroid injection at this time.     Past Medical History:  Diagnosis Date  . Allergy   . Arthritis   . Depression   . History of blood transfusion   . Spider bite    2016 and 2017  . UTERINE FIBROID 12/13/2006    Patient Active Problem List   Diagnosis Date Noted  . Essential hypertension 03/05/2018  . Preventative health care 10/12/2011  . OBESITY 08/31/2008  . GEN OSTEOARTHROSIS INVOLVING MULTIPLE SITES 08/31/2008  . DEPRESSION, MAJOR, RECURRENT 12/13/2006    Past Surgical History:  Procedure Laterality Date  . SHOULDER SURGERY    . TUBAL LIGATION      OB History   None      Home Medications    Prior to Admission medications   Medication Sig Start Date End Date Taking? Authorizing Provider  ALPRAZolam (XANAX) 0.25 MG tablet Take 1 tablet (0.25 mg total) by mouth 2 (two) times daily as needed for anxiety or sleep. 01/03/18  Yes Jaynee Eagles, PA-C  amLODipine (NORVASC) 5 MG tablet Take 1 tablet (5 mg total) by mouth daily. 01/03/18  Yes Jaynee Eagles, PA-C  cetirizine (ZYRTEC) 10 MG tablet Take 1 tablet (10 mg total) by mouth daily. 12/06/17  Yes Jaynee Eagles, PA-C  hydrochlorothiazide (HYDRODIURIL) 12.5 MG tablet  Take 1 tablet (12.5 mg total) by mouth daily. 03/05/18  Yes Jaynee Eagles, PA-C  Multiple Vitamins-Minerals (MULTIVITAL PO) Take by mouth.   Yes [provider]  Multiple Vitamins-Minerals (WOMENS HAIR, SKIN & NAILS PO) Take by mouth.   Yes [provider]  Omega-3 1000 MG CAPS Take by mouth.   Yes [provider]  Probiotic Product (PROBIOTIC ADVANCED PO) Take by mouth as needed.   Yes [provider]  Turmeric POWD by Does not apply route.   Yes [provider]    Family History Family History  Problem Relation Age of Onset  . Diabetes Mother   . Hypertension Mother   . Heart disease Mother   . Cancer Father   . Diabetes Sister   . Hypertension Sister   . Heart disease Sister   . Hypertension Brother   . Diabetes Brother   . Cancer Maternal Grandmother   . Colon cancer Neg Hx   . Colon polyps Neg Hx   . Esophageal cancer Neg Hx   . Stomach cancer Neg Hx   . Rectal cancer Neg Hx   . Breast cancer Neg Hx     Social History Social History   Tobacco Use  . Smoking status: Former Smoker    Types: Cigarettes    Last attempt to quit: 10/19/2011  Years since quitting: 6.6  . Smokeless tobacco: Never Used  Substance Use Topics  . Alcohol use: No  . Drug use: No     Allergies   Ibuprofen   Review of Systems Review of Systems  Constitutional: Negative for chills and fever.  HENT: Negative for ear pain and sore throat.   Eyes: Negative for pain and visual disturbance.  Respiratory: Negative for cough and shortness of breath.   Cardiovascular: Negative for chest pain and palpitations.  Gastrointestinal: Negative for abdominal pain and vomiting.  Genitourinary: Negative for dysuria and hematuria.  Musculoskeletal: Negative for arthralgias and back pain.  Skin: Negative for color change and rash.  Neurological: Negative for seizures and syncope.       Patient reports hotness to right side face and arm with swelling to right  wrist  All other systems reviewed and are negative.    Physical Exam Triage Vital Signs ED Triage Vitals  Enc Vitals Group     BP 05/25/18 1753 (!) 154/87     Pulse Rate 05/25/18 1753 71     Resp 05/25/18 1753 16     Temp 05/25/18 1753 98.2 F (36.8 C)     Temp Source 05/25/18 1753 Oral     SpO2 05/25/18 1753 100 %     Weight --      Height --      Head Circumference --      Peak Flow --      Pain Score 05/25/18 1759 3     Pain Loc --      Pain Edu? --      Excl. in [place]? --    No data found.  Updated Vital Signs BP (!) 154/87 (BP Location: Left Arm)   Pulse 71   Temp 98.2 F (36.8 C) (Oral)   Resp 16   SpO2 100%   Visual Acuity Right Eye Distance:   Left Eye Distance:   Bilateral Distance:    Right Eye Near:   Left Eye Near:    Bilateral Near:     Physical Exam  Constitutional: She is oriented to person, place, and time. She appears well-developed and well-nourished.  HENT:  Head: Normocephalic and atraumatic.  Eyes: Conjunctivae are normal.  Neck: Normal range of motion.  Pulmonary/Chest: Effort normal.  Neurological: She is alert and oriented to person, place, and time.  No neurological deficits noted.  Psychiatric: She has a normal mood and affect.  Nursing note and vitals reviewed.    UC Treatments / Results  Labs (all labs ordered are listed, but only abnormal results are displayed) Labs Reviewed - No data to display  EKG None  Radiology No results found.  Procedures Procedures (including critical care time)  Medications Ordered in UC Medications - No data to display  Initial Impression / Assessment and Plan / UC Course  I have reviewed the triage vital signs and the nursing notes.  Pertinent labs & imaging results that were available during my care of the patient were reviewed by me and considered in my medical decision making (see chart for details).      Final Clinical Impressions(s) / UC Diagnoses   Final diagnoses:  None     Discharge Instructions   None    ED Prescriptions    None     Controlled Substance Prescriptions [place] Controlled Substance Registry consulted? Not Applicable   Jacqualine Mau, NP 05/25/18 1815    Jacqualine Mau, NP 05/25/18 [phone]

## 2018-07-04 ENCOUNTER — Other Ambulatory Visit: Payer: Self-pay | Admitting: Internal Medicine

## 2018-07-04 ENCOUNTER — Other Ambulatory Visit: Payer: Self-pay | Admitting: Physician Assistant

## 2018-07-04 ENCOUNTER — Other Ambulatory Visit: Payer: Self-pay | Admitting: Urgent Care

## 2018-07-04 DIAGNOSIS — Z1231 Encounter for screening mammogram for malignant neoplasm of breast: Secondary | ICD-10-CM

## 2018-07-05 ENCOUNTER — Other Ambulatory Visit: Payer: Self-pay

## 2018-07-05 ENCOUNTER — Ambulatory Visit: Payer: BC Managed Care – PPO | Admitting: Family Medicine

## 2018-07-05 ENCOUNTER — Encounter: Payer: Self-pay | Admitting: Family Medicine

## 2018-07-05 VITALS — BP 139/85 | HR 65 | Temp 97.6°F | Ht 62.0 in | Wt 221.6 lb

## 2018-07-05 DIAGNOSIS — M170 Bilateral primary osteoarthritis of knee: Secondary | ICD-10-CM | POA: Diagnosis not present

## 2018-07-05 DIAGNOSIS — M25561 Pain in right knee: Secondary | ICD-10-CM

## 2018-07-05 DIAGNOSIS — G8929 Other chronic pain: Secondary | ICD-10-CM

## 2018-07-05 DIAGNOSIS — M25562 Pain in left knee: Secondary | ICD-10-CM | POA: Diagnosis not present

## 2018-07-05 MED ORDER — MELOXICAM 15 MG PO TABS: 15.0000 mg | ORAL_TABLET | Freq: Every day | ORAL | 0 refills | Status: DC

## 2018-07-05 NOTE — Patient Instructions (Addendum)
   If you have lab work done today you will be contacted with your lab results within the next 2 weeks.  If you have not heard from us then please contact us. The fastest way to get your results is to register for My Chart.   IF you received an x-ray today, you will receive an invoice from [facility]. Please contact [facility] at [phone] with questions or concerns regarding your invoice.   IF you received labwork today, you will receive an invoice from [facility]. Please contact [facility] at [phone] with questions or concerns regarding your invoice.   Our billing staff will not be able to assist you with questions regarding bills from these companies.  You will be contacted with the lab results as soon as they are available. The fastest way to get your results is to activate your My Chart account. Instructions are located on the last page of this paperwork. If you have not heard from us regarding the results in 2 weeks, please contact this office.      Osteoarthritis Osteoarthritis is a type of arthritis that affects tissue that covers the ends of bones in joints (cartilage). Cartilage acts as a cushion between the bones and helps them move smoothly. Osteoarthritis results when cartilage in the joints gets worn down. Osteoarthritis is sometimes called "wear and tear" arthritis. Osteoarthritis is the most common form of arthritis. It often occurs in older people. It is a condition that gets worse over time (a progressive condition). Joints that are most often affected by this condition are in:  Fingers.  Toes.  Hips.  Knees.  Spine, including neck and lower back.  What are the causes? This condition is caused by age-related wearing down of cartilage that covers the ends of bones. What increases the risk? The following factors may make you more likely to develop this condition:  Older age.  Being overweight or obese.  Overuse of joints, such as in  athletes.  Past injury of a joint.  Past surgery on a joint.  Family history of osteoarthritis.  What are the signs or symptoms? The main symptoms of this condition are pain, swelling, and stiffness in the joint. The joint may lose its shape over time. Small pieces of bone or cartilage may break off and float inside of the joint, which may cause more pain and damage to the joint. Small deposits of bone (osteophytes) may grow on the edges of the joint. Other symptoms may include:  A grating or scraping feeling inside the joint when you move it.  Popping or creaking sounds when you move.  Symptoms may affect one or more joints. Osteoarthritis in a major joint, such as your knee or hip, can make it painful to walk or exercise. If you have osteoarthritis in your hands, you might not be able to grip items, twist your hand, or control small movements of your hands and fingers (fine motor skills). How is this diagnosed? This condition may be diagnosed based on:  Your medical history.  A physical exam.  Your symptoms.  X-rays of the affected joint(s).  Blood tests to rule out other types of arthritis.  How is this treated? There is no cure for this condition, but treatment can help to control pain and improve joint function. Treatment plans may include:  A prescribed exercise program that allows for rest and joint relief. You may work with a physical therapist.  A weight control plan.  Pain relief techniques, such as: ?   Applying heat and cold to the joint. ? Electric pulses delivered to nerve endings under the skin (transcutaneous electrical nerve stimulation, or TENS). ? Massage. ? Certain nutritional supplements.  NSAIDs or prescription medicines to help relieve pain.  Medicine to help relieve pain and inflammation (corticosteroids). This can be given by mouth (orally) or as an injection.  Assistive devices, such as a brace, wrap, splint, specialized glove, or  cane.  Surgery, such as: ? An osteotomy. This is done to reposition the bones and relieve pain or to remove loose pieces of bone and cartilage. ? Joint replacement surgery. You may need this surgery if you have very bad (advanced) osteoarthritis.  Follow these instructions at home: Activity  Rest your affected joints as directed by your health care provider.  Do not drive or use heavy machinery while taking prescription pain medicine.  Exercise as directed. Your health care provider or physical therapist may recommend specific types of exercise, such as: ? Strengthening exercises. These are done to strengthen the muscles that support joints that are affected by arthritis. They can be performed with weights or with exercise bands to add resistance. ? Aerobic activities. These are exercises, such as brisk walking or water aerobics, that get your heart pumping. ? Range-of-motion activities. These keep your joints easy to move. ? Balance and agility exercises. Managing pain, stiffness, and swelling  If directed, apply heat to the affected area as often as told by your health care provider. Use the heat source that your health care provider recommends, such as a moist heat pack or a heating pad. ? If you have a removable assistive device, remove it as told by your health care provider. ? Place a towel between your skin and the heat source. If your health care provider tells you to keep the assistive device on while you apply heat, place a towel between the assistive device and the heat source. ? Leave the heat on for 20-30 minutes. ? Remove the heat if your skin turns bright red. This is especially important if you are unable to feel pain, heat, or cold. You may have a greater risk of getting burned.  If directed, put ice on the affected joint: ? If you have a removable assistive device, remove it as told by your health care provider. ? Put ice in a plastic bag. ? Place a towel between your  skin and the bag. If your health care provider tells you to keep the assistive device on during icing, place a towel between the assistive device and the bag. ? Leave the ice on for 20 minutes, 2-3 times a day. General instructions  Take over-the-counter and prescription medicines only as told by your health care provider.  Maintain a healthy weight. Follow instructions from your health care provider for weight control. These may include dietary restrictions.  Do not use any products that contain nicotine or tobacco, such as cigarettes and e-cigarettes. These can delay bone healing. If you need help quitting, ask your health care provider.  Use assistive devices as directed by your health care provider.  Keep all follow-up visits as told by your health care provider. This is important. Where to find more information:  National Institute of Arthritis and Musculoskeletal and Skin Diseases: www.niams.nih.gov  National Institute on Aging: www.nia.nih.gov  American College of Rheumatology: www.rheumatology.org Contact a health care provider if:  Your skin turns red.  You develop a rash.  You have pain that gets worse.  You have a fever along   with joint or muscle aches. Get help right away if:  You lose a lot of weight.  You suddenly lose your appetite.  You have night sweats. Summary  Osteoarthritis is a type of arthritis that affects tissue covering the ends of bones in joints (cartilage).  This condition is caused by age-related wearing down of cartilage that covers the ends of bones.  The main symptom of this condition is pain, swelling, and stiffness in the joint.  There is no cure for this condition, but treatment can help to control pain and improve joint function. This information is not intended to replace advice given to you by your health care provider. Make sure you discuss any questions you have with your health care provider. Document Released: 10/02/2005  Document Revised: 06/05/2016 Document Reviewed: 06/05/2016 Elsevier Interactive Patient Education  2018 Elsevier Inc.  

## 2018-07-05 NOTE — Progress Notes (Signed)
9/20/201911:00 AM  Doris Green 01-08-1956, 62 y.o. female [id number]  Chief Complaint  Patient presents with  . Arthritis    pain in both knees, not taking any pain meds right now. Was given a medication that she was told not to take by doctor at  the imaging center because it was used for seizures. Not sure of the name.    HPI:   Patient is a 62 y.o. 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 No Yes No No No  Number falls in past yr: - 1 - - -  Injury with Fall? - Yes - - -     Depression screen [facility] 2/9 07/05/2018 03/05/2018 02/07/2018  Decreased Interest 0 0 0  Down, Depressed, Hopeless 0 0 0  PHQ - 2 Score 0 0 0    Allergies  Allergen Reactions  . Ibuprofen     Blood in stool    Prior to Admission medications   Medication Sig Start Date End Date Taking? Authorizing Provider  amLODipine (NORVASC) 5 MG tablet Take 1 tablet (5 mg total) by mouth daily. 01/03/18  Yes Jaynee Eagles, PA-C  cetirizine (ZYRTEC) 10 MG tablet Take 1 tablet (10 mg total) by mouth daily. 12/06/17  Yes Jaynee Eagles, PA-C  hydrochlorothiazide (HYDRODIURIL) 12.5 MG tablet Take 1 tablet (12.5 mg total) by mouth daily. 03/05/18  Yes Jaynee Eagles, PA-C  Multiple Vitamins-Minerals (MULTIVITAL PO) Take by mouth.   Yes [provider]  Multiple Vitamins-Minerals (WOMENS HAIR, SKIN & NAILS PO) Take by mouth.   Yes [provider]  Omega-3 1000 MG CAPS Take by mouth.   Yes [provider]  Probiotic Product (PROBIOTIC ADVANCED PO) Take by mouth as needed.   Yes  [provider]  Turmeric POWD by Does not apply route.   Yes [provider]  ALPRAZolam (XANAX) 0.25 MG tablet Take 1 tablet (0.25 mg total) by mouth 2 (two) times daily as needed for anxiety or sleep. Patient not taking: Reported on 07/05/2018 01/03/18   Jaynee Eagles, PA-C    Past Medical History:  Diagnosis Date  . Allergy   . Arthritis   . Depression   . History of blood transfusion   . Spider bite    2016 and 2017  . UTERINE FIBROID 12/13/2006    Past Surgical History:  Procedure Laterality Date  . SHOULDER SURGERY    . TUBAL LIGATION      Social History   Tobacco Use  . Smoking status: Former Smoker    Types: Cigarettes    Last attempt to quit: 10/19/2011    Years since quitting: 6.7  . Smokeless tobacco: Never Used  Substance Use Topics  . Alcohol use: No    Family History  Problem Relation Age of Onset  . Diabetes Mother   . Hypertension Mother   . Heart disease Mother   . Cancer Father   . Diabetes Sister   . Hypertension Sister   . Heart disease Sister   . Hypertension Brother   . Diabetes Brother   .  Cancer Maternal Grandmother   . Colon cancer Neg Hx   . Colon polyps Neg Hx   . Esophageal cancer Neg Hx   . Stomach cancer Neg Hx   . Rectal cancer Neg Hx   . Breast cancer Neg Hx     ROS Per hpi  OBJECTIVE:  Blood pressure 139/85, pulse 65, temperature 97.6 F (36.4 C), temperature source Oral, height 5\' 2"  (1.575 m), weight 221 lb 9.6 oz (100.5 kg), SpO2 100 %. Body mass index is 40.53 kg/m.   Physical Exam  Constitutional: She is oriented to person, place, and time. She appears well-developed and well-nourished.  HENT:  Head: Normocephalic and atraumatic.  Mouth/Throat: Mucous membranes are normal.  Eyes: Pupils are equal, round, and reactive to light. Conjunctivae and EOM are normal. No scleral icterus.  Neck: Neck supple.  Pulmonary/Chest: Effort normal.  Musculoskeletal:       Right knee: She exhibits bony  tenderness. She exhibits normal range of motion, no swelling, no effusion, no LCL laxity, normal meniscus and no MCL laxity. No tenderness found. No medial joint line and no lateral joint line tenderness noted.       Left knee: She exhibits bony tenderness. She exhibits normal range of motion, no swelling, no effusion and normal meniscus. No medial joint line, no lateral joint line, no MCL and no LCL tenderness noted.  Neurological: She is alert and oriented to person, place, and time.  Skin: Skin is warm and dry.  Psychiatric: She has a normal mood and affect.  Nursing note and vitals reviewed.    ASSESSMENT and PLAN  1. Chronic pain of both knees 2. Primary osteoarthritis of both knees Discussed supportive measures, new meds r/se/b and RTC precautions. Patient educational handout given. Consider knee injections Other orders - meloxicam (MOBIC) 15 MG tablet; Take 1 tablet (15 mg total) by mouth daily.  Return if symptoms worsen or fail to improve, for CPE.    Rutherford Guys, MD [facility] at [place], [place] [address] Ph.  [phone] Fax [phone]

## 2018-08-07 ENCOUNTER — Other Ambulatory Visit: Payer: Self-pay

## 2018-08-07 ENCOUNTER — Ambulatory Visit (INDEPENDENT_AMBULATORY_CARE_PROVIDER_SITE_OTHER): Payer: BC Managed Care – PPO | Admitting: Family Medicine

## 2018-08-07 ENCOUNTER — Encounter: Payer: Self-pay | Admitting: Family Medicine

## 2018-08-07 VITALS — BP 130/88 | HR 78 | Temp 98.0°F | Resp 16 | Ht 62.0 in | Wt 217.0 lb

## 2018-08-07 DIAGNOSIS — Z833 Family history of diabetes mellitus: Secondary | ICD-10-CM

## 2018-08-07 DIAGNOSIS — Z0000 Encounter for general adult medical examination without abnormal findings: Secondary | ICD-10-CM

## 2018-08-07 DIAGNOSIS — Z13220 Encounter for screening for lipoid disorders: Secondary | ICD-10-CM

## 2018-08-07 DIAGNOSIS — Z1329 Encounter for screening for other suspected endocrine disorder: Secondary | ICD-10-CM | POA: Diagnosis not present

## 2018-08-07 DIAGNOSIS — I10 Essential (primary) hypertension: Secondary | ICD-10-CM

## 2018-08-07 DIAGNOSIS — Z13228 Encounter for screening for other metabolic disorders: Secondary | ICD-10-CM

## 2018-08-07 DIAGNOSIS — Z130 Encounter for screening for diseases of the blood and blood-forming organs and certain disorders involving the immune mechanism: Secondary | ICD-10-CM | POA: Diagnosis not present

## 2018-08-07 MED ORDER — HYDROXYZINE HCL 10 MG PO TABS: 10.0000 mg | ORAL_TABLET | Freq: Three times a day (TID) | ORAL | 0 refills | Status: DC | PRN

## 2018-08-07 NOTE — Progress Notes (Signed)
10/23/201910:04 AM  Doris Green 1956/02/14, 62 y.o. female [id number]  Chief Complaint  Patient presents with  . Annual Exam    HPI:   Patient is a 62 y.o. 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 No No Yes No No  Number falls in past yr: - - 1 - -  Injury with Fall? - - Yes - -     Depression screen [facility] 2/9 08/07/2018 07/05/2018 03/05/2018  Decreased Interest 0 0 0  Down, Depressed, Hopeless 0 0 0  PHQ - 2 Score 0 0 0    Allergies  Allergen Reactions  . Ibuprofen     Blood in stool    Prior to Admission medications   Medication Sig Start Date End Date Taking? Authorizing Provider  amLODipine (NORVASC) 5 MG tablet Take 1 tablet (5 mg total) by mouth daily. 01/03/18  Yes Jaynee Eagles, PA-C  cetirizine (ZYRTEC) 10 MG tablet Take 1 tablet (10 mg total) by mouth daily. 12/06/17  Yes Jaynee Eagles, PA-C  hydrochlorothiazide (HYDRODIURIL) 12.5 MG tablet Take 1 tablet (12.5 mg total) by mouth daily. 03/05/18  Yes Jaynee Eagles, PA-C  Multiple Vitamins-Minerals (MULTIVITAL PO) Take by mouth.   Yes [provider]  Multiple Vitamins-Minerals (WOMENS HAIR, SKIN & NAILS PO) Take  by mouth.   Yes [provider]  Omega-3 1000 MG CAPS Take by mouth.   Yes [provider]  Probiotic Product (PROBIOTIC ADVANCED PO) Take by mouth as needed.   Yes [provider]    Past Medical History:  Diagnosis Date  . Allergy   . Arthritis   . Depression   . History of blood transfusion   . Spider bite    2016 and 2017  . UTERINE FIBROID 12/13/2006    Past Surgical History:  Procedure Laterality Date  . SHOULDER SURGERY    . TUBAL LIGATION      Social History   Tobacco Use  . Smoking status: Former Smoker    Types: Cigarettes    Last attempt to quit: 10/19/2011    Years since quitting: 6.8  . Smokeless tobacco: Never Used  Substance Use Topics  . Alcohol use: No    Family History  Problem Relation Age of Onset  . Diabetes Mother   . Hypertension Mother   . Heart disease Mother   . Cancer Father   . Diabetes Sister   . Hypertension Sister   . Heart disease Sister   . Hypertension Brother   . Diabetes Brother   . Cancer Maternal Grandmother   . Colon cancer Neg Hx   . Colon polyps Neg Hx   . Esophageal cancer Neg Hx   . Stomach cancer Neg Hx   . Rectal cancer Neg Hx   . Breast cancer Neg  Hx     Review of Systems  Constitutional: Negative for chills and fever.  HENT: Negative for hearing loss and tinnitus.   Eyes: Negative for blurred vision and double vision.  Respiratory: Negative for cough and shortness of breath.   Cardiovascular: Positive for leg swelling (right ankle swells towards end of the day). Negative for chest pain and palpitations.  Gastrointestinal: Negative for abdominal pain, nausea and vomiting.  Genitourinary: Negative for dysuria and hematuria.  Musculoskeletal: Positive for falls (earlier this year, while she was closing the car door ) and joint pain.  Neurological: Negative for dizziness, tingling and headaches.  Psychiatric/Behavioral: Negative for depression. The patient is nervous/anxious (having  stressors with daughter) and has insomnia.     OBJECTIVE:  Blood pressure 130/88, pulse 78, temperature 98 F (36.7 C), temperature source Oral, resp. rate 16, height 5\' 2"  (1.575 m), weight 217 lb (98.4 kg), SpO2 99 %. Body mass index is 39.69 kg/m.   Wt Readings from Last 3 Encounters:  08/07/18 217 lb (98.4 kg)  07/05/18 221 lb 9.6 oz (100.5 kg)  03/05/18 217 lb 6.4 oz (98.6 kg)    Visual Acuity Screening   Right eye Left eye Both eyes  Without correction: 20/20 20/30 20/20   With correction:       Physical Exam  Constitutional: She is oriented to person, place, and time. She appears well-developed and well-nourished.  HENT:  Head: Normocephalic and atraumatic.  Right Ear: Hearing, tympanic membrane, external ear and ear canal normal.  Left Ear: Hearing, tympanic membrane, external ear and ear canal normal.  Mouth/Throat: Oropharynx is clear and moist.  Eyes: Pupils are equal, round, and reactive to light. Conjunctivae and EOM are normal.  Neck: Neck supple. No thyromegaly present.  Cardiovascular: Normal rate, regular rhythm, normal heart sounds and intact distal pulses. Exam reveals no gallop and no friction rub.  No murmur heard. Pulmonary/Chest: Effort normal and breath sounds normal. She has no wheezes. She has no rales.  Abdominal: Soft. Bowel sounds are normal. She exhibits no distension and no mass. There is no tenderness.  Musculoskeletal: Normal range of motion. She exhibits no edema.  Lymphadenopathy:    She has no cervical adenopathy.  Neurological: She is alert and oriented to person, place, and time. She has normal reflexes. No cranial nerve deficit. Gait normal.  Skin: Skin is warm and dry.  Psychiatric: She has a normal mood and affect.  Nursing note and vitals reviewed.   ASSESSMENT and PLAN  1. Routine general medical examination at a health care facility Routine HCM labs ordered. HCM reviewed/discussed. Anticipatory guidance regarding healthy weight,  lifestyle and choices given.   2. Essential hypertension Controlled. Continue current regime.  - Comprehensive metabolic panel - Lipid panel - TSH - Hemoglobin A1c  3. Screening for deficiency anemia - CBC  4. Screening for lipoid disorders - Lipid panel  5. Screening for endocrine, metabolic and immunity disorder - TSH - Hemoglobin A1c  6. FHx: type 2 diabetes mellitus - Hemoglobin A1c  Other orders - hydrOXYzine (ATARAX/VISTARIL) 10 MG tablet; Take 1 tablet (10 mg total) by mouth 3 (three) times daily as needed. Trial of hydroxyzine to help with coping of current stressors  Return in 6 months (on 02/06/2019).    Rutherford Guys, MD Primary Care at [facility] [place], [place] [address] Ph.  [phone] Fax [phone]

## 2018-08-07 NOTE — Patient Instructions (Addendum)
If you have lab work done today you will be contacted with your lab results within the next 2 weeks.  If you have not heard from [place] then please contact us. The fastest way to get your results is to register for My Chart.   IF you received an x-ray today, you will receive an invoice from [facility]. Please contact [facility] at [phone] with questions or concerns regarding your invoice.   IF you received labwork today, you will receive an invoice from [place]. Please contact [facility] at [phone] with questions or concerns regarding your invoice.   Our billing staff will not be able to assist you with questions regarding bills from these companies.  You will be contacted with the lab results as soon as they are available. The fastest way to get your results is to activate your My Chart account. Instructions are located on the last page of this paperwork. If you have not heard from [place] regarding the results in 2 weeks, please contact this office.      Health Maintenance, Female Adopting a healthy lifestyle and getting preventive care can go a long way to promote health and wellness. Talk with your health care provider about what schedule of regular examinations is right for you. This is a good chance for you to check in with your provider about disease prevention and staying healthy. In between checkups, there are plenty of things you can do on your own. Experts have done a lot of research about which lifestyle changes and preventive measures are most likely to keep you healthy. Ask your health care provider for more information. Weight and diet Eat a healthy diet  Be sure to include plenty of vegetables, fruits, low-fat dairy products, and lean protein.  Do not eat a lot of foods high in solid fats, added sugars, or salt.  Get regular exercise. This is one of the most important things you can do for your health. ? Most adults should exercise for at least 150  minutes each week. The exercise should increase your heart rate and make you sweat (moderate-intensity exercise). ? Most adults should also do strengthening exercises at least twice a week. This is in addition to the moderate-intensity exercise.  Maintain a healthy weight  Body mass index (BMI) is a measurement that can be used to identify possible weight problems. It estimates body fat based on height and weight. Your health care provider can help determine your BMI and help you achieve or maintain a healthy weight.  For females 78 years of age and older: ? A BMI below 18.5 is considered underweight. ? A BMI of 18.5 to 24.9 is normal. ? A BMI of 25 to 29.9 is considered overweight. ? A BMI of 30 and above is considered obese.  Watch levels of cholesterol and blood lipids  You should start having your blood tested for lipids and cholesterol at 62 years of age, then have this test every 5 years.  You may need to have your cholesterol levels checked more often if: ? Your lipid or cholesterol levels are high. ? You are older than 62 years of age. ? You are at high risk for heart disease.  Cancer screening Lung Cancer  Lung cancer screening is recommended for adults 82-69 years old who are at high risk for lung cancer because of a history of smoking.  A yearly low-dose CT scan of the lungs is recommended for people who: ? Currently smoke. ? Have  within the past 15 years. ? Have at least a 30-pack-year history of smoking. A pack year is smoking an average of one pack of cigarettes a day for 1 year.  Yearly screening should continue until it has been 15 years since you quit.  Yearly screening should stop if you develop a health problem that would prevent you from having lung cancer treatment.  Breast Cancer  Practice breast self-awareness. This means understanding how your breasts normally appear and feel.  It also means doing regular breast self-exams. Let your health care  provider know about any changes, no matter how small.  If you are in your 20s or 30s, you should have a clinical breast exam (CBE) by a health care provider every 1-3 years as part of a regular health exam.  If you are 40 or older, have a CBE every year. Also consider having a breast X-ray (mammogram) every year.  If you have a family history of breast cancer, talk to your health care provider about genetic screening.  If you are at high risk for breast cancer, talk to your health care provider about having an MRI and a mammogram every year.  Breast cancer gene (BRCA) assessment is recommended for women who have family members with BRCA-related cancers. BRCA-related cancers include: ? Breast. ? Ovarian. ? Tubal. ? Peritoneal cancers.  Results of the assessment will determine the need for genetic counseling and BRCA1 and BRCA2 testing.  Cervical Cancer Your health care provider may recommend that you be screened regularly for cancer of the pelvic organs (ovaries, uterus, and vagina). This screening involves a pelvic examination, including checking for microscopic changes to the surface of your cervix (Pap test). You may be encouraged to have this screening done every 3 years, beginning at age 21.  For women ages 30-65, health care providers may recommend pelvic exams and Pap testing every 3 years, or they may recommend the Pap and pelvic exam, combined with testing for human papilloma virus (HPV), every 5 years. Some types of HPV increase your risk of cervical cancer. Testing for HPV may also be done on women of any age with unclear Pap test results.  Other health care providers may not recommend any screening for nonpregnant women who are considered low risk for pelvic cancer and who do not have symptoms. Ask your health care provider if a screening pelvic exam is right for you.  If you have had past treatment for cervical cancer or a condition that could lead to cancer, you need Pap tests  and screening for cancer for at least 20 years after your treatment. If Pap tests have been discontinued, your risk factors (such as having a new sexual partner) need to be reassessed to determine if screening should resume. Some women have medical problems that increase the chance of getting cervical cancer. In these cases, your health care provider may recommend more frequent screening and Pap tests.  Colorectal Cancer  This type of cancer can be detected and often prevented.  Routine colorectal cancer screening usually begins at 62 years of age and continues through 62 years of age.  Your health care provider may recommend screening at an earlier age if you have risk factors for colon cancer.  Your health care provider may also recommend using home test kits to check for hidden blood in the stool.  A small camera at the end of a tube can be used to examine your colon directly (sigmoidoscopy or colonoscopy). This is done to check   check for the earliest forms of colorectal cancer.  Routine screening usually begins at age 60.  Direct examination of the colon should be repeated every 5-10 years through 62 years of age. However, you may need to be screened more often if early forms of precancerous polyps or small growths are found.  Skin Cancer  Check your skin from head to toe regularly.  Tell your health care provider about any new moles or changes in moles, especially if there is a change in a mole's shape or color.  Also tell your health care provider if you have a mole that is larger than the size of a pencil eraser.  Always use sunscreen. Apply sunscreen liberally and repeatedly throughout the day.  Protect yourself by wearing long sleeves, pants, a wide-brimmed hat, and sunglasses whenever you are outside.  Heart disease, diabetes, and high blood pressure  High blood pressure causes heart disease and increases the risk of stroke. High blood pressure is more likely to develop  in: ? People who have blood pressure in the high end of the normal range (130-139/85-89 mm Hg). ? People who are overweight or obese. ? People who are African American.  If you are 4-10 years of age, have your blood pressure checked every 3-5 years. If you are 49 years of age or older, have your blood pressure checked every year. You should have your blood pressure measured twice-once when you are at a hospital or clinic, and once when you are not at a hospital or clinic. Record the average of the two measurements. To check your blood pressure when you are not at a hospital or clinic, you can use: ? An automated blood pressure machine at a pharmacy. ? A home blood pressure monitor.  If you are between 32 years and 66 years old, ask your health care provider if you should take aspirin to prevent strokes.  Have regular diabetes screenings. This involves taking a blood sample to check your fasting blood sugar level. ? If you are at a normal weight and have a low risk for diabetes, have this test once every three years after 62 years of age. ? If you are overweight and have a high risk for diabetes, consider being tested at a younger age or more often. Preventing infection Hepatitis B  If you have a higher risk for hepatitis B, you should be screened for this virus. You are considered at high risk for hepatitis B if: ? You were born in a country where hepatitis B is common. Ask your health care provider which countries are considered high risk. ? Your parents were born in a high-risk country, and you have not been immunized against hepatitis B (hepatitis B vaccine). ? You have HIV or AIDS. ? You use needles to inject street drugs. ? You live with someone who has hepatitis B. ? You have had sex with someone who has hepatitis B. ? You get hemodialysis treatment. ? You take certain medicines for conditions, including cancer, organ transplantation, and autoimmune conditions.  Hepatitis C  Blood  testing is recommended for: ? Everyone born from [birth year] through 1965. ? Anyone with known risk factors for hepatitis C.  Sexually transmitted infections (STIs)  You should be screened for sexually transmitted infections (STIs) including gonorrhea and chlamydia if: ? You are sexually active and are younger than 62 years of age. ? You are older than 62 years of age and your health care provider tells you that you are at risk  for this type of infection. ? Your sexual activity has changed since you were last screened and you are at an increased risk for chlamydia or gonorrhea. Ask your health care provider if you are at risk.  If you do not have HIV, but are at risk, it may be recommended that you take a prescription medicine daily to prevent HIV infection. This is called pre-exposure prophylaxis (PrEP). You are considered at risk if: ? You are sexually active and do not regularly use condoms or know the HIV status of your partner(s). ? You take drugs by injection. ? You are sexually active with a partner who has HIV.  Talk with your health care provider about whether you are at high risk of being infected with HIV. If you choose to begin PrEP, you should first be tested for HIV. You should then be tested every 3 months for as long as you are taking PrEP. Pregnancy  If you are premenopausal and you may become pregnant, ask your health care provider about preconception counseling.  If you may become pregnant, take 400 to 800 micrograms (mcg) of folic acid every day.  If you want to prevent pregnancy, talk to your health care provider about birth control (contraception). Osteoporosis and menopause  Osteoporosis is a disease in which the bones lose minerals and strength with aging. This can result in serious bone fractures. Your risk for osteoporosis can be identified using a bone density scan.  If you are 44 years of age or older, or if you are at risk for osteoporosis and fractures, ask your  health care provider if you should be screened.  Ask your health care provider whether you should take a calcium or vitamin D supplement to lower your risk for osteoporosis.  Menopause may have certain physical symptoms and risks.  Hormone replacement therapy may reduce some of these symptoms and risks. Talk to your health care provider about whether hormone replacement therapy is right for you. Follow these instructions at home:  Schedule regular health, dental, and eye exams.  Stay current with your immunizations.  Do not use any tobacco products including cigarettes, chewing tobacco, or electronic cigarettes.  If you are pregnant, do not drink alcohol.  If you are breastfeeding, limit how much and how often you drink alcohol.  Limit alcohol intake to no more than 1 drink per day for nonpregnant women. One drink equals 12 ounces of beer, 5 ounces of wine, or 1 ounces of hard liquor.  Do not use street drugs.  Do not share needles.  Ask your health care provider for help if you need support or information about quitting drugs.  Tell your health care provider if you often feel depressed.  Tell your health care provider if you have ever been abused or do not feel safe at home. This information is not intended to replace advice given to you by your health care provider. Make sure you discuss any questions you have with your health care provider. Document Released: 04/17/2011 Document Revised: 03/09/2016 Document Reviewed: 07/06/2015 Elsevier Interactive Patient Education  2018 [place] Eating Plan DASH stands for "Dietary Approaches to Stop Hypertension." The DASH eating plan is a healthy eating plan that has been shown to reduce high blood pressure (hypertension). It may also reduce your risk for type 2 diabetes, heart disease, and stroke. The DASH eating plan may also help with weight loss. What are tips for following this plan? General guidelines  Avoid eating  more than  2,300 mg (milligrams) of salt (sodium) a day. If you have hypertension, you may need to reduce your sodium intake to 1,500 mg a day.  Limit alcohol intake to no more than 1 drink a day for nonpregnant women and 2 drinks a day for men. One drink equals 12 oz of beer, 5 oz of wine, or 1 oz of hard liquor.  Work with your health care provider to maintain a healthy body weight or to lose weight. Ask what an ideal weight is for you.  Get at least 30 minutes of exercise that causes your heart to beat faster (aerobic exercise) most days of the week. Activities may include walking, swimming, or biking.  Work with your health care provider or diet and nutrition specialist (dietitian) to adjust your eating plan to your individual calorie needs. Reading food labels  Check food labels for the amount of sodium per serving. Choose foods with less than 5 percent of the Daily Value of sodium. Generally, foods with less than 300 mg of sodium per serving fit into this eating plan.  To find whole grains, look for the word "whole" as the first word in the ingredient list. Shopping  Buy products labeled as "low-sodium" or "no salt added."  Buy fresh foods. Avoid canned foods and premade or frozen meals. Cooking  Avoid adding salt when cooking. Use salt-free seasonings or herbs instead of table salt or sea salt. Check with your health care provider or pharmacist before using salt substitutes.  Do not fry foods. Cook foods using healthy methods such as baking, boiling, grilling, and broiling instead.  Cook with heart-healthy oils, such as olive, canola, soybean, or sunflower oil. Meal planning   Eat a balanced diet that includes: ? 5 or more servings of fruits and vegetables each day. At each meal, try to fill half of your plate with fruits and vegetables. ? Up to 6-8 servings of whole grains each day. ? Less than 6 oz of lean meat, poultry, or fish each day. A 3-oz serving of meat is about the  same size as a deck of cards. One egg equals 1 oz. ? 2 servings of low-fat dairy each day. ? A serving of nuts, seeds, or beans 5 times each week. ? Heart-healthy fats. Healthy fats called Omega-3 fatty acids are found in foods such as flaxseeds and coldwater fish, like sardines, salmon, and mackerel.  Limit how much you eat of the following: ? Canned or prepackaged foods. ? Food that is high in trans fat, such as fried foods. ? Food that is high in saturated fat, such as fatty meat. ? Sweets, desserts, sugary drinks, and other foods with added sugar. ? Full-fat dairy products.  Do not salt foods before eating.  Try to eat at least 2 vegetarian meals each week.  Eat more home-cooked food and less restaurant, buffet, and fast food.  When eating at a restaurant, ask that your food be prepared with less salt or no salt, if possible. What foods are recommended? The items listed may not be a complete list. Talk with your dietitian about what dietary choices are best for you. Grains Whole-grain or whole-wheat bread. Whole-grain or whole-wheat pasta. Brown rice. [place]. Bulgur. Whole-grain and low-sodium cereals. Pita bread. Low-fat, low-sodium crackers. Whole-wheat flour tortillas. Vegetables Fresh or frozen vegetables (raw, steamed, roasted, or grilled). Low-sodium or reduced-sodium tomato and vegetable juice. Low-sodium or reduced-sodium tomato sauce and tomato paste. Low-sodium or reduced-sodium canned vegetables. Fruits All fresh, dried, or frozen  fruit. Canned fruit in natural juice (without added sugar). Meat and other protein foods Skinless chicken or [place]. Ground chicken or [place]. Pork with fat trimmed off. Fish and seafood. Egg whites. Dried beans, peas, or lentils. Unsalted nuts, nut butters, and seeds. Unsalted canned beans. Lean cuts of beef with fat trimmed off. Low-sodium, lean deli meat. Dairy Low-fat (1%) or fat-free (skim) milk. Fat-free, low-fat, or reduced-fat  cheeses. Nonfat, low-sodium ricotta or cottage cheese. Low-fat or nonfat yogurt. Low-fat, low-sodium cheese. Fats and oils Soft margarine without trans fats. Vegetable oil. Low-fat, reduced-fat, or light mayonnaise and salad dressings (reduced-sodium). Canola, safflower, olive, soybean, and sunflower oils. Avocado. Seasoning and other foods Herbs. Spices. Seasoning mixes without salt. Unsalted popcorn and pretzels. Fat-free sweets. What foods are not recommended? The items listed may not be a complete list. Talk with your dietitian about what dietary choices are best for you. Grains Baked goods made with fat, such as croissants, muffins, or some breads. Dry pasta or rice meal packs. Vegetables Creamed or fried vegetables. Vegetables in a cheese sauce. Regular canned vegetables (not low-sodium or reduced-sodium). Regular canned tomato sauce and paste (not low-sodium or reduced-sodium). Regular tomato and vegetable juice (not low-sodium or reduced-sodium). Angie Fava. Olives. Fruits Canned fruit in a light or heavy syrup. Fried fruit. Fruit in cream or butter sauce. Meat and other protein foods Fatty cuts of meat. Ribs. Fried meat. Berniece Salines. Sausage. Bologna and other processed lunch meats. Salami. Fatback. Hotdogs. Bratwurst. Salted nuts and seeds. Canned beans with added salt. Canned or smoked fish. Whole eggs or egg yolks. Chicken or [place] with skin. Dairy Whole or 2% milk, cream, and half-and-half. Whole or full-fat cream cheese. Whole-fat or sweetened yogurt. Full-fat cheese. Nondairy creamers. Whipped toppings. Processed cheese and cheese spreads. Fats and oils Butter. Stick margarine. Lard. Shortening. Ghee. Bacon fat. Tropical oils, such as coconut, palm kernel, or palm oil. Seasoning and other foods Salted popcorn and pretzels. Onion salt, garlic salt, seasoned salt, table salt, and sea salt. Worcestershire sauce. Tartar sauce. Barbecue sauce. Teriyaki sauce. Soy sauce, including  reduced-sodium. Steak sauce. Canned and packaged gravies. Fish sauce. Oyster sauce. Cocktail sauce. Horseradish that you find on the shelf. Ketchup. Mustard. Meat flavorings and tenderizers. Bouillon cubes. Hot sauce and Tabasco sauce. Premade or packaged marinades. Premade or packaged taco seasonings. Relishes. Regular salad dressings. Where to find more information:  National Heart, Lung, and [place]: https://wilson-eaton.com/  American Heart Association: www.heart.org Summary  The DASH eating plan is a healthy eating plan that has been shown to reduce high blood pressure (hypertension). It may also reduce your risk for type 2 diabetes, heart disease, and stroke.  With the DASH eating plan, you should limit salt (sodium) intake to 2,300 mg a day. If you have hypertension, you may need to reduce your sodium intake to 1,500 mg a day.  When on the DASH eating plan, aim to eat more fresh fruits and vegetables, whole grains, lean proteins, low-fat dairy, and heart-healthy fats.  Work with your health care provider or diet and nutrition specialist (dietitian) to adjust your eating plan to your individual calorie needs. This information is not intended to replace advice given to you by your health care provider. Make sure you discuss any questions you have with your health care provider. Document Released: 09/21/2011 Document Revised: 09/25/2016 Document Reviewed: 09/25/2016 Elsevier Interactive Patient Education  [employer].

## 2018-08-08 LAB — LIPID PANEL
Chol/HDL Ratio: 2.5 ratio (ref 0.0–4.4)
Cholesterol, Total: 198 mg/dL (ref 100–199)
HDL: 78 mg/dL (ref >39)
LDL Calculated: 101 mg/dL — ABNORMAL HIGH (ref 0–99)
Triglycerides: 94 mg/dL (ref 0–149)
VLDL Cholesterol Cal: 19 mg/dL (ref 5–40)

## 2018-08-08 LAB — CBC
Hematocrit: 38.8 % (ref 34.0–46.6)
Hemoglobin: 12.1 g/dL (ref 11.1–15.9)
MCH: 24.9 pg — ABNORMAL LOW (ref 26.6–33.0)
MCHC: 31.2 g/dL — ABNORMAL LOW (ref 31.5–35.7)
MCV: 80 fL (ref 79–97)
Platelets: 291 10*3/uL (ref 150–450)
RBC: 4.86 x10E6/uL (ref 3.77–5.28)
RDW: 13.5 % (ref 12.3–15.4)
WBC: 8.2 10*3/uL (ref 3.4–10.8)

## 2018-08-08 LAB — COMPREHENSIVE METABOLIC PANEL
ALT: 15 IU/L (ref 0–32)
AST: 17 IU/L (ref 0–40)
Albumin/Globulin Ratio: 2 (ref 1.2–2.2)
Albumin: 4.5 g/dL (ref 3.6–4.8)
Alkaline Phosphatase: 82 IU/L (ref 39–117)
BUN/Creatinine Ratio: 12 (ref 12–28)
BUN: 11 mg/dL (ref 8–27)
Bilirubin Total: 0.4 mg/dL (ref 0.0–1.2)
CO2: 25 mmol/L (ref 20–29)
Calcium: 9.9 mg/dL (ref 8.7–10.3)
Chloride: 103 mmol/L (ref 96–106)
Creatinine, Ser: 0.93 mg/dL (ref 0.57–1.00)
GFR calc Af Amer: 76 mL/min/{1.73_m2} (ref >59)
GFR calc non Af Amer: 66 mL/min/{1.73_m2} (ref >59)
Globulin, Total: 2.3 g/dL (ref 1.5–4.5)
Glucose: 110 mg/dL — ABNORMAL HIGH (ref 65–99)
Potassium: 3.9 mmol/L (ref 3.5–5.2)
Sodium: 142 mmol/L (ref 134–144)
Total Protein: 6.8 g/dL (ref 6.0–8.5)

## 2018-08-08 LAB — TSH: TSH: 2.01 u[IU]/mL (ref 0.450–4.500)

## 2018-08-08 LAB — HEMOGLOBIN A1C
Est. average glucose Bld gHb Est-mCnc: 120 mg/dL
Hgb A1c MFr Bld: 5.8 % — ABNORMAL HIGH (ref 4.8–5.6)

## 2018-08-15 ENCOUNTER — Ambulatory Visit
Admission: RE | Admit: 2018-08-15 | Discharge: 2018-08-15 | Disposition: A | Discharge status: Home / Self Care | Payer: BC Managed Care – PPO | Source: Ambulatory Visit | Attending: Urgent Care | Admitting: Urgent Care

## 2018-08-15 DIAGNOSIS — Z1231 Encounter for screening mammogram for malignant neoplasm of breast: Secondary | ICD-10-CM

## 2018-09-10 ENCOUNTER — Other Ambulatory Visit: Payer: Self-pay | Admitting: Urgent Care

## 2018-09-11 NOTE — Telephone Encounter (Signed)
Requested medication (s) are due for refill today -yes  Requested medication (s) are on the active medication list -yes  Future visit scheduled -yes  Last refill: 03/05/18 1 RF  Notes to clinic: Patient was patient of [facility]- seen to establish care 08/07/18 by [place]- to continue current medications. Needs medication sent in in new provider's name.  Requested Prescriptions  Pending Prescriptions Disp Refills   hydrochlorothiazide (HYDRODIURIL) 12.5 MG tablet [Pharmacy Med Name: HYDROCHLOROTHIAZIDE 12.5MG  TABLETS] 90 tablet 0    Sig: TAKE 1 TABLET(12.5 MG) BY MOUTH DAILY     Cardiovascular: Diuretics - Thiazide Passed - 09/11/2018  1:25 PM      Passed - Ca in normal range and within 360 days    Calcium  Date Value Ref Range Status  08/07/2018 9.9 8.7 - 10.3 mg/dL Final         Passed - Cr in normal range and within 360 days    Creat  Date Value Ref Range Status  05/16/2016 0.88 0.50 - 0.99 mg/dL Final    Comment:      For patients > or = 62 years of age: The upper reference limit for Creatinine is approximately 13% higher for people identified as African-American.      Creatinine, Ser  Date Value Ref Range Status  08/07/2018 0.93 0.57 - 1.00 mg/dL Final         Passed - K in normal range and within 360 days    Potassium  Date Value Ref Range Status  08/07/2018 3.9 3.5 - 5.2 mmol/L Final         Passed - Na in normal range and within 360 days    Sodium  Date Value Ref Range Status  08/07/2018 142 134 - 144 mmol/L Final         Passed - Last BP in normal range    BP Readings from Last 1 Encounters:  08/07/18 130/88         Passed - Valid encounter within last 6 months    Recent Outpatient Visits          1 month ago Routine general medical examination at a health care facility   [facility] [place], Lilia Argue, MD   2 months ago Chronic pain of both knees   [facility], Lilia Argue, MD   6 months ago Essential hypertension   Primary  Care at [facility], [place], [place]   7 months ago Essential hypertension   Primary Care at [facility], [place], [place]   8 months ago Essential hypertension   Primary Care at [facility], [place], [place]      Future Appointments            In 4 months Rutherford Guys, MD Primary [facility] at [place], [facility]            Requested Prescriptions  Pending Prescriptions Disp Refills   hydrochlorothiazide (HYDRODIURIL) 12.5 MG tablet [Pharmacy Med Name: HYDROCHLOROTHIAZIDE 12.5MG  TABLETS] 90 tablet 0    Sig: TAKE 1 TABLET(12.5 MG) BY MOUTH DAILY     Cardiovascular: Diuretics - Thiazide Passed - 09/11/2018  1:25 PM      Passed - Ca in normal range and within 360 days    Calcium  Date Value Ref Range Status  08/07/2018 9.9 8.7 - 10.3 mg/dL Final         Passed - Cr in normal range and within 360 days    Creat  Date Value Ref  Range Status  05/16/2016 0.88 0.50 - 0.99 mg/dL Final    Comment:      For patients > or = 62 years of age: The upper reference limit for Creatinine is approximately 13% higher for people identified as African-American.      Creatinine, Ser  Date Value Ref Range Status  08/07/2018 0.93 0.57 - 1.00 mg/dL Final         Passed - K in normal range and within 360 days    Potassium  Date Value Ref Range Status  08/07/2018 3.9 3.5 - 5.2 mmol/L Final         Passed - Na in normal range and within 360 days    Sodium  Date Value Ref Range Status  08/07/2018 142 134 - 144 mmol/L Final         Passed - Last BP in normal range    BP Readings from Last 1 Encounters:  08/07/18 130/88         Passed - Valid encounter within last 6 months    Recent Outpatient Visits          1 month ago Routine general medical examination at a health care facility   [facility] at [place], Lilia Argue, MD   2 months ago Chronic pain of both knees   [facility], Lilia Argue, MD   6 months ago Essential hypertension   Primary Care at [facility], [place], [place]   7  months ago Essential hypertension   Primary Care at [facility], [place], [place]   8 months ago Essential hypertension   Primary Care at [facility], [place], [place]      Future Appointments            In 4 months Pamella Pert, Lilia Argue, MD Primary [facility] at [place], [facility]

## 2018-09-11 NOTE — Telephone Encounter (Signed)
Patient called to get the status of her refill.  She stated that she only has 1 pill left.  Patient states she needs this refilled today.  CB# [phone]

## 2018-10-28 ENCOUNTER — Ambulatory Visit: Payer: Self-pay | Admitting: *Deleted

## 2018-10-28 NOTE — Telephone Encounter (Signed)
Call to pt.  States she picked up new Amlodipine and has been feeling very dizzy and fatigued since then.  States she has confirmed with the ID on the pills that it is the correct pill.  Also has new HCTZ and states it looks different than before but pharmacy told her it is correct pill.  No other changes in medication. Did not take Amlodipine for one day and did not feel dizzy. Does not have BP cuff at home.  Recommended that she hold Amlodipine tomorrow.  Strongly recommend that she go to pharmacy at lunch or after work to take BP.  Pt not able to be seen tomorrow at 5:00.  Offered first thing following morning and she has somewhere to be.  Scheduling for 11:20 10/30/2018.  Advised pt again to take BP at pharmacy.  Advised that if she feels worsening dizziness, headache, "off", or any other concerning symptoms prior to appt, go to urgent care or emergency department. Pt verbalizes understanding.

## 2018-10-28 NOTE — Telephone Encounter (Signed)
Pt called with complaints of dizziness, and not feeling well since picking up her last batch of amlodipine (around 11/14/2017);  she says that she  Feels: sleepy, feverish, and has headache; the pt has no BP readings ;she says that she did not take the medication on 10/27/18 she felt fine, but once she took the medication today her symptoms came back; recommendations made per nurse triage protocol; the pt normally sees Dr Pamella Pert but she has no availalbility within the parameters per protocol; the pt is unable to accept an appointment with Dr Delia Chimes on 10/29/2018 at 1700 because she will have to take time off from work; she is requesting a morning appointment due to her work schedule; no other providers have availability within the time constraints that the pt is available; spoke with Solmon Ice, and she request that this information be sent to the office high priority; informed pt she will get a call back from the office; she can be contacted at [phone]; she verbalized understanding; will route for notification.   Reason for Disposition . Taking a medicine that could cause dizziness (e.g., blood pressure medications, diuretics)  Answer Assessment - Initial Assessment Questions 1. DESCRIPTION: "Describe your dizziness."     Light headed 2. LIGHTHEADED: "Do you feel lightheaded?" (e.g., somewhat faint, woozy, weak upon standing)     woozy 3. VERTIGO: "Do you feel like either you or the room is spinning or tilting?" (i.e. vertigo)     sometimes 4. SEVERITY: "How bad is it?"  "Do you feel like you are going to faint?" "Can you stand and walk?"   - MILD - walking normally   - MODERATE - interferes with normal activities (e.g., work, school)    - SEVERE - unable to stand, requires support to walk, feels like passing out now.      moderate 5. ONSET:  "When did the dizziness begin?"     Around 10/14/2018 6. AGGRAVATING FACTORS: "Does anything make it worse?" (e.g., standing, change in head  position)     No; symptoms constant 7. HEART RATE: "Can you tell me your heart rate?" "How many beats in 15 seconds?"  (Note: not all patients can do this)       no 8. CAUSE: "What do you think is causing the dizziness?"     ? medication 9. RECURRENT SYMPTOM: "Have you had dizziness before?" If so, ask: "When was the last time?" "What happened that time?"     When she initially started taking medication 10. OTHER SYMPTOMS: "Do you have any other symptoms?" (e.g., fever, chest pain, vomiting, diarrhea, bleeding)       Hot, sleepy, headache 11. PREGNANCY: "Is there any chance you are pregnant?" "When was your last menstrual period?"       no  Protocols used: DIZZINESS [facility]

## 2018-10-30 ENCOUNTER — Ambulatory Visit: Payer: BC Managed Care – PPO | Admitting: Family Medicine

## 2018-10-30 ENCOUNTER — Encounter: Payer: Self-pay | Admitting: Family Medicine

## 2018-10-30 ENCOUNTER — Other Ambulatory Visit: Payer: Self-pay

## 2018-10-30 VITALS — BP 149/84 | HR 83 | Temp 98.0°F | Resp 16 | Wt 220.0 lb

## 2018-10-30 DIAGNOSIS — R5383 Other fatigue: Secondary | ICD-10-CM

## 2018-10-30 DIAGNOSIS — R002 Palpitations: Secondary | ICD-10-CM

## 2018-10-30 DIAGNOSIS — I10 Essential (primary) hypertension: Secondary | ICD-10-CM

## 2018-10-30 LAB — POCT CBC
GRANULOCYTE PERCENT: 54.9 % (ref 37–80)
HEMATOCRIT: 37 % (ref 29–41)
Hemoglobin: 11.9 g/dL (ref 11–14.6)
Lymph, poc: 3.4 (ref 0.6–3.4)
MCH: 25.5 pg — AB (ref 27–31.2)
MCHC: 32.1 g/dL (ref 31.8–35.4)
MCV: 79.2 fL (ref 76–111)
MID (CBC): 0.3 (ref 0–0.9)
MPV: 8.4 fL (ref 0–99.8)
PLATELET COUNT, POC: 293 10*3/uL (ref 142–424)
POC GRANULOCYTE: 4.6 (ref 2–6.9)
POC LYMPH PERCENT: 41.3 %L (ref 10–50)
POC MID %: 3.8 % (ref 0–12)
RBC: 4.67 M/uL (ref 4.04–5.48)
RDW, POC: 15.1 %
WBC: 8.3 10*3/uL (ref 4.6–10.2)

## 2018-10-30 NOTE — Patient Instructions (Addendum)
Fatigue and other symptoms may be related to multiple causes as we discussed. I will check some screening labs.  For now, decrease amlodipine to 2.64m (1/2 pill) per day.  If symptoms return on that dose, then stop it altogether for now.  Please see information below on adjustment disorder/stress.  I would recommend meeting with therapist to help with those symptoms, okay to continue hydroxyzine for now, and follow-up with Dr. SPamella Pertnext week. Return to the clinic or go to the nearest emergency room if any of your symptoms worsen or new symptoms occur.  [facility]: [phone] Palpitations Palpitations are feelings that your heartbeat is irregular or is faster than normal. It may feel like your heart is fluttering or skipping a beat. Palpitations are usually not a serious problem. They may be caused by many things, including smoking, caffeine, alcohol, stress, and certain medicines or drugs. Most causes of palpitations are not serious. However, some palpitations can be a sign of a serious problem. You may need further tests to rule out serious medical problems. Follow these instructions at home:     Pay attention to any changes in your condition. Take these actions to help manage your symptoms: Eating and drinking  Avoid foods and drinks that may cause palpitations. These may include: ? Caffeinated coffee, tea, soft drinks, diet pills, and energy drinks. ? Chocolate. ? Alcohol. Lifestyle  Take steps to reduce your stress and anxiety. Things that can help you relax include: ? Yoga. ? Mind-body activities, such as deep breathing, meditation, or using words and images to create positive thoughts (guided imagery). ? Physical activity, such as swimming, jogging, or walking. Tell your health care provider if your palpitations increase with activity. If you have chest pain or shortness of breath with activity, do not continue the activity until you are seen by your  health care provider. ? Biofeedback. This is a method that helps you learn to use your mind to control things in your body, such as your heartbeat.  Do not use drugs, including cocaine or ecstasy. Do not use marijuana.  Get plenty of rest and sleep. Keep a regular bed time. General instructions  Take over-the-counter and prescription medicines only as told by your health care provider.  Do not use any products that contain nicotine or tobacco, such as cigarettes and e-cigarettes. If you need help quitting, ask your health care provider.  Keep all follow-up visits as told by your health care provider. This is important. These may include visits for further testing if palpitations do not go away or get worse. Contact a health care provider if you:  Continue to have a fast or irregular heartbeat after 24 hours.  Notice that your palpitations occur more often. Get help right away if you:  Have chest pain or shortness of breath.  Have a severe headache.  Feel dizzy or you faint. Summary  Palpitations are feelings that your heartbeat is irregular or is faster than normal. It may feel like your heart is fluttering or skipping a beat.  Palpitations may be caused by many things, including smoking, caffeine, alcohol, stress, certain medicines, and drugs.  Although most causes of palpitations are not serious, some causes can be a sign of a serious medical problem.  Get help right away if you faint or have chest pain, shortness of breath, a severe headache, or dizziness. This information is not intended to replace advice given to you by your health care provider. Make sure you discuss  any questions you have with your health care provider. Document Released: 09/29/2000 Document Revised: 11/14/2017 Document Reviewed: 11/14/2017 Elsevier Interactive Patient Education  2019 [employer]  Adjustment Disorder, Adult Adjustment disorder is a group of symptoms that can develop after a stressful  life event, such as the loss of a job or serious physical illness. The symptoms can affect how you feel, think, and act. They may interfere with your relationships. Adjustment disorder increases your risk of suicide and substance abuse. If this disorder is not managed early, it can develop into a more serious condition, such as major depressive disorder or post-traumatic stress disorder. What are the causes? This condition happens when you have trouble recovering from or coping with a stressful life event. What increases the risk? You are more likely to develop this condition if:  You have had depression or anxiety.  You are being treated for a long-term (chronic) illness.  You are being treated for an illness that cannot be cured (terminal illness).  You have a family history of mental illness. What are the signs or symptoms? Symptoms of this condition include:  Extreme trouble doing daily tasks, such as going to work.  Sadness, depression, or crying spells.  Worrying a lot.  Loss of enjoyment.  Change in appetite or weight.  Feelings of loss or hopelessness.  Thoughts of suicide.  Anxiety, worry, or nervousness.  Trouble sleeping.  Avoiding family and friends.  Fighting or vandalism.  Complaining of feeling sick without being ill.  Feeling dazed or disconnected.  Nightmares.  Trouble sleeping.  Irritability.  Reckless driving.  Poor work Systems analyst.  Ignoring bills. Symptoms of this condition start within three months of the stressful event. They do not last more than six months, unless the stressful circumstances last longer. Normal grieving after the death of a loved one is not a symptom of this condition. How is this diagnosed? To diagnose this condition, your health care provider will ask about what has happened in your life and how it has affected you. He or she may also ask about your medical history and your use of medicines, alcohol, and other  substances. Your health care provider may do a physical exam and order lab tests or other studies. You may be referred to a mental health specialist. How is this treated? Treatment options for this condition include:  Counseling or talk therapy. Talk therapy is usually provided by mental health specialists.  Medicines. Certain medicines may help with depression, anxiety, and sleep.  Support groups. These offer emotional support, advice, and guidance. They are made up of people who have had similar experiences.  Observation and time. This is sometimes called "watchful waiting." In this treatment, health care providers monitor your health and behavior without other treatment. Adjustment disorder sometimes gets better on its own with time. Follow these instructions at home:  Take over-the-counter and prescription medicines only as told by your health care provider.  Keep all follow-up visits as told by your health care provider. This is important. Contact a health care provider if:  Your symptoms do not improve in six months.  Your symptoms get worse. Get help right away if:  You have serious thoughts about hurting yourself or someone else. If you ever feel like you may hurt yourself or others, or have thoughts about taking your own life, get help right away. You can go to your nearest emergency department or call:  Your local emergency services (911 in the [place]  A suicide crisis helpline,  such as the [facility] at 712-652-3010. This is open 24 hours a day. Summary  Adjustment disorder is a group of symptoms that can develop after a stressful life event, such as the loss of a job or serious physical illness. The symptoms can affect how you feel, think, and act. They may interfere with your relationships.  Symptoms of this condition start within three months of the stressful event. They do not last more than six months, unless the stressful circumstances  last longer.  Treatment may include talk therapy, medicines, participation in a support group, or observation to see if symptoms improve.  Contact your health care provider if your symptoms get worse or do not improve in six months.  If you ever feel like you may hurt yourself or others, or have thoughts about taking your own life, get help right away. This information is not intended to replace advice given to you by your health care provider. Make sure you discuss any questions you have with your health care provider. Document Released: 06/06/2006 Document Revised: 12/01/2016 Document Reviewed: 12/01/2016 Elsevier Interactive Patient Education  2019 [employer].  Fatigue If you have fatigue, you feel tired all the time and have a lack of energy or a lack of motivation. Fatigue may make it difficult to start or complete tasks because of exhaustion. In general, occasional or mild fatigue is often a normal response to activity or life. However, long-lasting (chronic) or extreme fatigue may be a symptom of a medical condition. Follow these instructions at home: General instructions  Watch your fatigue for any changes.  Go to bed and get up at the same time every day.  Avoid fatigue by pacing yourself during the day and getting enough sleep at night.  Maintain a healthy weight. Medicines  Take over-the-counter and prescription medicines only as told by your health care provider.  Take a multivitamin, if told by your health care provider.  Do not use herbal or dietary supplements unless they are approved by your health care provider. Activity   Exercise regularly, as told by your health care provider.  Use or practice techniques to help you relax, such as yoga, tai chi, meditation, or massage therapy. Eating and drinking   Avoid heavy meals in the evening.  Eat a well-balanced diet, which includes lean proteins, whole grains, plenty of fruits and vegetables, and low-fat dairy  products.  Avoid consuming too much caffeine.  Avoid the use of alcohol.  Drink enough fluid to keep your urine pale yellow. Lifestyle  Change situations that cause you stress. Try to keep your work and personal schedule in balance.  Do not use any products that contain nicotine or tobacco, such as cigarettes and e-cigarettes. If you need help quitting, ask your health care provider.  Do not use drugs. Contact a health care provider if:  Your fatigue does not get better.  You have a fever.  You suddenly lose or gain weight.  You have headaches.  You have trouble falling asleep or sleeping through the night.  You feel angry, guilty, anxious, or sad.  You are unable to have a bowel movement (constipation).  Your skin is dry.  You have swelling in your legs or another part of your body. Get help right away if:  You feel confused.  Your vision is blurry.  You feel faint or you pass out.  You have a severe headache.  You have severe pain in your abdomen, your back, or the area  between your waist and hips (pelvis).  You have chest pain, shortness of breath, or an irregular or fast heartbeat.  You are unable to urinate, or you urinate less than normal.  You have abnormal bleeding, such as bleeding from the rectum, vagina, nose, lungs, or nipples.  You vomit blood.  You have thoughts about hurting yourself or others. If you ever feel like you may hurt yourself or others, or have thoughts about taking your own life, get help right away. You can go to your nearest emergency department or call:  Your local emergency services (911 in the [place]  A suicide crisis helpline, such as the [facility] at 7037167682. This is open 24 hours a day. Summary  If you have fatigue, you feel tired all the time and have a lack of energy or a lack of motivation.  Fatigue may make it difficult to start or complete tasks because of  exhaustion.  Long-lasting (chronic) or extreme fatigue may be a symptom of a medical condition.  Exercise regularly, as told by your health care provider.  Change situations that cause you stress. Try to keep your work and personal schedule in balance. This information is not intended to replace advice given to you by your health care provider. Make sure you discuss any questions you have with your health care provider. Document Released: 07/30/2007 Document Revised: 06/27/2017 Document Reviewed: 06/27/2017 Elsevier Interactive Patient Education  2019 [employer].  Adjustment Disorder, Adult Adjustment disorder is a group of symptoms that can develop after a stressful life event, such as the loss of a job or serious physical illness. The symptoms can affect how you feel, think, and act. They may interfere with your relationships. Adjustment disorder increases your risk of suicide and substance abuse. If this disorder is not managed early, it can develop into a more serious condition, such as major depressive disorder or post-traumatic stress disorder. What are the causes? This condition happens when you have trouble recovering from or coping with a stressful life event. What increases the risk? You are more likely to develop this condition if:  You have had depression or anxiety.  You are being treated for a long-term (chronic) illness.  You are being treated for an illness that cannot be cured (terminal illness).  You have a family history of mental illness. What are the signs or symptoms? Symptoms of this condition include:  Extreme trouble doing daily tasks, such as going to work.  Sadness, depression, or crying spells.  Worrying a lot.  Loss of enjoyment.  Change in appetite or weight.  Feelings of loss or hopelessness.  Thoughts of suicide.  Anxiety, worry, or nervousness.  Trouble sleeping.  Avoiding family and friends.  Fighting or vandalism.  Complaining  of feeling sick without being ill.  Feeling dazed or disconnected.  Nightmares.  Trouble sleeping.  Irritability.  Reckless driving.  Poor work Systems analyst.  Ignoring bills. Symptoms of this condition start within three months of the stressful event. They do not last more than six months, unless the stressful circumstances last longer. Normal grieving after the death of a loved one is not a symptom of this condition. How is this diagnosed? To diagnose this condition, your health care provider will ask about what has happened in your life and how it has affected you. He or she may also ask about your medical history and your use of medicines, alcohol, and other substances. Your health care provider may do a physical exam and  order lab tests or other studies. You may be referred to a mental health specialist. How is this treated? Treatment options for this condition include:  Counseling or talk therapy. Talk therapy is usually provided by mental health specialists.  Medicines. Certain medicines may help with depression, anxiety, and sleep.  Support groups. These offer emotional support, advice, and guidance. They are made up of people who have had similar experiences.  Observation and time. This is sometimes called "watchful waiting." In this treatment, health care providers monitor your health and behavior without other treatment. Adjustment disorder sometimes gets better on its own with time. Follow these instructions at home:  Take over-the-counter and prescription medicines only as told by your health care provider.  Keep all follow-up visits as told by your health care provider. This is important. Contact a health care provider if:  Your symptoms do not improve in six months.  Your symptoms get worse. Get help right away if:  You have serious thoughts about hurting yourself or someone else. If you ever feel like you may hurt yourself or others, or have thoughts about  taking your own life, get help right away. You can go to your nearest emergency department or call:  Your local emergency services (911 in the [place]  A suicide crisis helpline, such as the [place] at 765-258-1563. This is open 24 hours a day. Summary  Adjustment disorder is a group of symptoms that can develop after a stressful life event, such as the loss of a job or serious physical illness. The symptoms can affect how you feel, think, and act. They may interfere with your relationships.  Symptoms of this condition start within three months of the stressful event. They do not last more than six months, unless the stressful circumstances last longer.  Treatment may include talk therapy, medicines, participation in a support group, or observation to see if symptoms improve.  Contact your health care provider if your symptoms get worse or do not improve in six months.  If you ever feel like you may hurt yourself or others, or have thoughts about taking your own life, get help right away. This information is not intended to replace advice given to you by your health care provider. Make sure you discuss any questions you have with your health care provider. Document Released: 06/06/2006 Document Revised: 12/01/2016 Document Reviewed: 12/01/2016 Elsevier Interactive Patient Education  [employer].   If you have lab work done today you will be contacted with your lab results within the next 2 weeks.  If you have not heard from [place] then please contact us. The fastest way to get your results is to register for My Chart.   IF you received an x-ray today, you will receive an invoice from [facility]. Please contact [facility] at [phone] with questions or concerns regarding your invoice.   IF you received labwork today, you will receive an invoice from [facility]. Please contact [facility] at [phone] with questions or concerns regarding  your invoice.   Our billing staff will not be able to assist you with questions regarding bills from these companies.  You will be contacted with the lab results as soon as they are available. The fastest way to get your results is to activate your My Chart account. Instructions are located on the last page of this paperwork. If you have not heard from [place] regarding the results in 2 weeks, please contact this office.

## 2018-10-30 NOTE — Progress Notes (Addendum)
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 will receive an invoice from [facility]. Please contact [facility] at [phone] with questions or concerns regarding your invoice.   IF you received labwork today, you will receive an invoice from [place]. Please contact [facility] at [phone] with questions or concerns regarding your invoice.   Our billing staff will not be able to assist you with questions regarding bills from these companies.  You will be contacted with the lab results as soon as they are available. The fastest way to get your results is to activate your My Chart account. Instructions are located on the  last page of this paperwork. If you have not heard from [place] regarding the results in 2 weeks, please contact this office.       I personally performed the services described in this documentation, which was scribed in my presence. The recorded information has been reviewed and considered for accuracy and completeness, addended by me as needed, and agree with information above.  Signed,   Merri Ray, MD [facility] [place].  10/30/18 1:55 PM

## 2018-10-31 LAB — BASIC METABOLIC PANEL
BUN / CREAT RATIO: 16 (ref 12–28)
BUN: 11 mg/dL (ref 8–27)
CO2: 23 mmol/L (ref 20–29)
CREATININE: 0.69 mg/dL (ref 0.57–1.00)
Calcium: 9.6 mg/dL (ref 8.7–10.3)
Chloride: 104 mmol/L (ref 96–106)
GFR calc Af Amer: 108 mL/min/{1.73_m2} (ref >59)
GFR calc non Af Amer: 94 mL/min/{1.73_m2} (ref >59)
GLUCOSE: 86 mg/dL (ref 65–99)
Potassium: 4.3 mmol/L (ref 3.5–5.2)
SODIUM: 139 mmol/L (ref 134–144)

## 2018-10-31 LAB — TSH: TSH: 1.96 u[IU]/mL (ref 0.450–4.500)

## 2018-11-08 ENCOUNTER — Ambulatory Visit: Payer: BC Managed Care – PPO | Admitting: Family Medicine

## 2018-11-08 ENCOUNTER — Telehealth: Payer: Self-pay | Admitting: Family Medicine

## 2018-11-08 NOTE — Telephone Encounter (Signed)
Called pt to inform her that Dr Pamella Pert is out sick on 11/08/18 pt became very upset on phone stating that she had already rescheduled this appointment once already and we could not reschedule her again. I tried to explain that the provider called out sick this morning and that we do not have any openings today, patient then hung up on me.  If she calls back please try to reschedule her for 11/09/18. Thank you

## 2018-12-04 ENCOUNTER — Ambulatory Visit (INDEPENDENT_AMBULATORY_CARE_PROVIDER_SITE_OTHER): Payer: BC Managed Care – PPO | Admitting: Family Medicine

## 2018-12-04 ENCOUNTER — Encounter: Payer: Self-pay | Admitting: Family Medicine

## 2018-12-04 VITALS — BP 134/79 | HR 62 | Temp 98.1°F | Resp 14 | Ht 62.0 in | Wt 217.0 lb

## 2018-12-04 DIAGNOSIS — I10 Essential (primary) hypertension: Secondary | ICD-10-CM | POA: Diagnosis not present

## 2018-12-04 LAB — POCT URINALYSIS DIPSTICK
Bilirubin, UA: NEGATIVE
Glucose, UA: NEGATIVE
Ketones, UA: NEGATIVE
Nitrite, UA: NEGATIVE
Protein, UA: POSITIVE — AB
Spec Grav, UA: >=1.03 — AB (ref 1.010–1.025)
Urobilinogen, UA: 0.2 E.U./dL
pH, UA: 5.5 (ref 5.0–8.0)

## 2018-12-04 MED ORDER — LOSARTAN POTASSIUM-HCTZ 50-12.5 MG PO TABS: 1.0000 | ORAL_TABLET | Freq: Every day | ORAL | 2 refills | Status: DC

## 2018-12-04 NOTE — Patient Instructions (Signed)
Hydrochlorothiazide, HCTZ; Losartan tablets What is this medicine? LOSARTAN; HYDROCHLOROTHIAZIDE (loe SAR tan; hye droe klor oh THYE a zide) is a combination of a drug that relaxes blood vessels and a diuretic. It is used to treat high blood pressure. This medicine may also reduce the risk of stroke in certain patients. This medicine may be used for other purposes; ask your health care provider or pharmacist if you have questions. COMMON BRAND NAME(S): Hyzaar What should I tell my health care provider before I take this medicine? They need to know if you have any of these conditions: -decreased urine -kidney disease -liver disease -if you are on a special diet, like a low-salt diet -immune system problems, like lupus -an unusual or allergic reaction to losartan, hydrochlorothiazide, sulfa drugs, other medicines, foods, dyes, or preservatives -pregnant or trying to get pregnant -breast-feeding How should I use this medicine? Take this medicine by mouth with a glass of water. Follow the directions on the prescription label. You can take it with or without food. If it upsets your stomach, take it with food. Take your medicine at regular intervals. Do not take it more often than directed. Do not stop taking except on your doctor's advice. Talk to your pediatrician regarding the use of this medicine in children. Special care may be needed. Overdosage: If you think you have taken too much of this medicine contact a poison control center or emergency room at once. NOTE: This medicine is only for you. Do not share this medicine with others. What if I miss a dose? If you miss a dose, take it as soon as you can. If it is almost time for your next dose, take only that dose. Do not take double or extra doses. What may interact with this medicine? -barbiturates, like phenobarbital -blood pressure medicines -celecoxib -cimetidine -corticosteroids -diabetic medicines -diuretics, especially triamterene,  spironolactone or amiloride -fluconazole -lithium -NSAIDs, medicines for pain and inflammation, like ibuprofen or naproxen -potassium salts or potassium supplements -prescription pain medicines -rifampin -skeletal muscle relaxants like tubocurarine -some cholesterol-lowering medicines like cholestyramine or colestipol This list may not describe all possible interactions. Give your health care provider a list of all the medicines, herbs, non-prescription drugs, or dietary supplements you use. Also tell them if you smoke, drink alcohol, or use illegal drugs. Some items may interact with your medicine. What should I watch for while using this medicine? Check your blood pressure regularly while you are taking this medicine. Ask your doctor or health care professional what your blood pressure should be and when you should contact him or her. When you check your blood pressure, write down the measurements to show your doctor or health care professional. If you are taking this medicine for a long time, you must visit your health care professional for regular checks on your progress. Make sure you schedule appointments on a regular basis. You must not get dehydrated. Ask your doctor or health care professional how much fluid you need to drink a day. Check with him or her if you get an attack of severe diarrhea, nausea and vomiting, or if you sweat a lot. The loss of too much body fluid can make it dangerous for you to take this medicine. Women should inform their doctor if they wish to become pregnant or think they might be pregnant. There is a potential for serious side effects to an unborn child, particularly in the second or third trimester. Talk to your health care professional or pharmacist for more information.  You may get drowsy or dizzy. Do not drive, use machinery, or do anything that needs mental alertness until you know how this drug affects you. Do not stand or sit up quickly, especially if you are  an older patient. This reduces the risk of dizzy or fainting spells. Alcohol can make you more drowsy and dizzy. Avoid alcoholic drinks. This medicine may affect your blood sugar level. If you have diabetes, check with your doctor or health care professional before changing the dose of your diabetic medicine. Avoid salt substitutes unless you are told otherwise by your doctor or health care professional. Do not treat yourself for coughs, colds, or pain while you are taking this medicine without asking your doctor or health care professional for advice. Some ingredients may increase your blood pressure. What side effects may I notice from receiving this medicine? Side effects that you should report to your doctor or health care professional as soon as possible: -allergic reactions like skin rash, itching or hives, swelling of the face, lips, or tongue -breathing problems -changes in vision -dark urine -eye pain -fast or irregular heart beat, palpitations, or chest pain -feeling faint or lightheaded -muscle cramps -persistent dry cough -redness, blistering, peeling or loosening of the skin, including inside the mouth -stomach pain -trouble passing urine or change in the amount of urine -unusual bleeding or bruising -worsened gout pain -yellowing of the eyes or skin Side effects that usually do not require medical attention (report to your doctor or health care professional if they continue or are bothersome): -change in sex drive or performance -headache This list may not describe all possible side effects. Call your doctor for medical advice about side effects. You may report side effects to FDA at 1-800-FDA-1088. Where should I keep my medicine? Keep out of the reach of children. Store at room temperature between 15 and 30 degrees C (59 and 86 degrees F). Protect from light. Keep container tightly closed. Throw away any unused medicine after the expiration date. NOTE: This sheet is a  summary. It may not cover all possible information. If you have questions about this medicine, talk to your doctor, pharmacist, or health care provider.  2019 Elsevier/Gold Standard (2010-06-22 13:57:32) Managing Your Hypertension Hypertension is commonly called high blood pressure. This is when the force of your blood pressing against the walls of your arteries is too strong. Arteries are blood vessels that carry blood from your heart throughout your body. Hypertension forces the heart to work harder to pump blood, and may cause the arteries to become narrow or stiff. Having untreated or uncontrolled hypertension can cause heart attack, stroke, kidney disease, and other problems. What are blood pressure readings? A blood pressure reading consists of a higher number over a lower number. Ideally, your blood pressure should be below 120/80. The first ("top") number is called the systolic pressure. It is a measure of the pressure in your arteries as your heart beats. The second ("bottom") number is called the diastolic pressure. It is a measure of the pressure in your arteries as the heart relaxes. What does my blood pressure reading mean? Blood pressure is classified into four stages. Based on your blood pressure reading, your health care provider may use the following stages to determine what type of treatment you need, if any. Systolic pressure and diastolic pressure are measured in a unit called mm Hg. Normal  Systolic pressure: below 161.  Diastolic pressure: below 80. Elevated  Systolic pressure: 096-045.  Diastolic pressure: below 80.  Hypertension stage 1  Systolic pressure: 924-268.  Diastolic pressure: 34-19. Hypertension stage 2  Systolic pressure: 622 or above.  Diastolic pressure: 90 or above. What health risks are associated with hypertension? Managing your hypertension is an important responsibility. Uncontrolled hypertension can lead to:  A heart attack.  A stroke.  A  weakened blood vessel (aneurysm).  Heart failure.  Kidney damage.  Eye damage.  Metabolic syndrome.  Memory and concentration problems. What changes can I make to manage my hypertension? Hypertension can be managed by making lifestyle changes and possibly by taking medicines. Your health care provider will help you make a plan to bring your blood pressure within a normal range. Eating and drinking   Eat a diet that is high in fiber and potassium, and low in salt (sodium), added sugar, and fat. An example eating plan is called the DASH (Dietary Approaches to Stop Hypertension) diet. To eat this way: ? Eat plenty of fresh fruits and vegetables. Try to fill half of your plate at each meal with fruits and vegetables. ? Eat whole grains, such as whole wheat pasta, brown rice, or whole grain bread. Fill about one quarter of your plate with whole grains. ? Eat low-fat diary products. ? Avoid fatty cuts of meat, processed or cured meats, and poultry with skin. Fill about one quarter of your plate with lean proteins such as fish, chicken without skin, beans, eggs, and tofu. ? Avoid premade and processed foods. These tend to be higher in sodium, added sugar, and fat.  Reduce your daily sodium intake. Most people with hypertension should eat less than 1,500 mg of sodium a day.  Limit alcohol intake to no more than 1 drink a day for nonpregnant women and 2 drinks a day for men. One drink equals 12 oz of beer, 5 oz of wine, or 1 oz of hard liquor. Lifestyle  Work with your health care provider to maintain a healthy body weight, or to lose weight. Ask what an ideal weight is for you.  Get at least 30 minutes of exercise that causes your heart to beat faster (aerobic exercise) most days of the week. Activities may include walking, swimming, or biking.  Include exercise to strengthen your muscles (resistance exercise), such as weight lifting, as part of your weekly exercise routine. Try to do these  types of exercises for 30 minutes at least 3 days a week.  Do not use any products that contain nicotine or tobacco, such as cigarettes and e-cigarettes. If you need help quitting, ask your health care provider.  Control any long-term (chronic) conditions you have, such as high cholesterol or diabetes. Monitoring  Monitor your blood pressure at home as told by your health care provider. Your personal target blood pressure may vary depending on your medical conditions, your age, and other factors.  Have your blood pressure checked regularly, as often as told by your health care provider. Working with your health care provider  Review all the medicines you take with your health care provider because there may be side effects or interactions.  Talk with your health care provider about your diet, exercise habits, and other lifestyle factors that may be contributing to hypertension.  Visit your health care provider regularly. Your health care provider can help you create and adjust your plan for managing hypertension. Will I need medicine to control my blood pressure? Your health care provider may prescribe medicine if lifestyle changes are not enough to get your blood pressure under control,  and if:  Your systolic blood pressure is 130 or higher.  Your diastolic blood pressure is 80 or higher. Take medicines only as told by your health care provider. Follow the directions carefully. Blood pressure medicines must be taken as prescribed. The medicine does not work as well when you skip doses. Skipping doses also puts you at risk for problems. Contact a health care provider if:  You think you are having a reaction to medicines you have taken.  You have repeated (recurrent) headaches.  You feel dizzy.  You have swelling in your ankles.  You have trouble with your vision. Get help right away if:  You develop a severe headache or confusion.  You have unusual weakness or numbness, or you  feel faint.  You have severe pain in your chest or abdomen.  You vomit repeatedly.  You have trouble breathing. Summary  Hypertension is when the force of blood pumping through your arteries is too strong. If this condition is not controlled, it may put you at risk for serious complications.  Your personal target blood pressure may vary depending on your medical conditions, your age, and other factors. For most people, a normal blood pressure is less than 120/80.  Hypertension is managed by lifestyle changes, medicines, or both. Lifestyle changes include weight loss, eating a healthy, low-sodium diet, exercising more, and limiting alcohol. This information is not intended to replace advice given to you by your health care provider. Make sure you discuss any questions you have with your health care provider. Document Released: 06/26/2012 Document Revised: 08/30/2016 Document Reviewed: 08/30/2016 Elsevier Interactive Patient Education  2019 [employer]. Hypertension Hypertension is another name for high blood pressure. High blood pressure forces your heart to work harder to pump blood. This can cause problems over time. There are two numbers in a blood pressure reading. There is a top number (systolic) over a bottom number (diastolic). It is best to have a blood pressure below 120/80. Healthy choices can help lower your blood pressure. You may need medicine to help lower your blood pressure if:  Your blood pressure cannot be lowered with healthy choices.  Your blood pressure is higher than 130/80. Follow these instructions at home: Eating and drinking   If directed, follow the DASH eating plan. This diet includes: ? Filling half of your plate at each meal with fruits and vegetables. ? Filling one quarter of your plate at each meal with whole grains. Whole grains include whole wheat pasta, brown rice, and whole grain bread. ? Eating or drinking low-fat dairy products, such as skim milk  or low-fat yogurt. ? Filling one quarter of your plate at each meal with low-fat (lean) proteins. Low-fat proteins include fish, skinless chicken, eggs, beans, and tofu. ? Avoiding fatty meat, cured and processed meat, or chicken with skin. ? Avoiding premade or processed food.  Eat less than 1,500 mg of salt (sodium) a day.  Limit alcohol use to no more than 1 drink a day for nonpregnant women and 2 drinks a day for men. One drink equals 12 oz of beer, 5 oz of wine, or 1 oz of hard liquor. Lifestyle  Work with your doctor to stay at a healthy weight or to lose weight. Ask your doctor what the best weight is for you.  Get at least 30 minutes of exercise that causes your heart to beat faster (aerobic exercise) most days of the week. This may include walking, swimming, or biking.  Get at least 30 minutes  of exercise that strengthens your muscles (resistance exercise) at least 3 days a week. This may include lifting weights or pilates.  Do not use any products that contain nicotine or tobacco. This includes cigarettes and e-cigarettes. If you need help quitting, ask your doctor.  Check your blood pressure at home as told by your doctor.  Keep all follow-up visits as told by your doctor. This is important. Medicines  Take over-the-counter and prescription medicines only as told by your doctor. Follow directions carefully.  Do not skip doses of blood pressure medicine. The medicine does not work as well if you skip doses. Skipping doses also puts you at risk for problems.  Ask your doctor about side effects or reactions to medicines that you should watch for. Contact a doctor if:  You think you are having a reaction to the medicine you are taking.  You have headaches that keep coming back (recurring).  You feel dizzy.  You have swelling in your ankles.  You have trouble with your vision. Get help right away if:  You get a very bad headache.  You start to feel confused.  You  feel weak or numb.  You feel faint.  You get very bad pain in your: ? Chest. ? Belly (abdomen).  You throw up (vomit) more than once.  You have trouble breathing. Summary  Hypertension is another name for high blood pressure.  Making healthy choices can help lower blood pressure. If your blood pressure cannot be controlled with healthy choices, you may need to take medicine. This information is not intended to replace advice given to you by your health care provider. Make sure you discuss any questions you have with your health care provider. Document Released: 03/20/2008 Document Revised: 08/30/2016 Document Reviewed: 08/30/2016 Elsevier Interactive Patient Education  2019 [employer].

## 2018-12-04 NOTE — Progress Notes (Signed)
[facility]  New Patient Encounter Provider: Lanae Boast, [place]    JKK:[id number]  [id number]  DOB - Mar 24, 1956  SUBJECTIVE:   Doris Green, is a 63 y.o. female who presents to establish care with this clinic.   Current problems/concerns:   Patient would like to have a combination anti-hypertensive medication. Patient is currently taking amlodipine 5mg  and HCTZ 12.5. She reports compliance with medications. Patient did not take them this AM. Patient denies side effects. Recently seen at [facility] and is transferring care to this clinic.   Allergies  Allergen Reactions  . Ibuprofen     Blood in stool   Past Medical History:  Diagnosis Date  . Allergy   . Arthritis   . Depression   . History of blood transfusion   . Spider bite    2016 and 2017  . UTERINE FIBROID 12/13/2006   Current Outpatient Medications on File Prior to Visit  Medication Sig Dispense Refill  . amLODipine (NORVASC) 5 MG tablet Take 1 tablet (5 mg total) by mouth daily. (Patient taking differently: Take 2.5 mg by mouth daily. ) 90 tablet 3  . hydrochlorothiazide (HYDRODIURIL) 12.5 MG tablet TAKE 1 TABLET(12.5 MG) BY MOUTH DAILY 90 tablet 0  . cetirizine (ZYRTEC) 10 MG tablet Take 1 tablet (10 mg total) by mouth daily. (Patient not taking: Reported on 12/04/2018) 30 tablet 11  . hydrOXYzine (ATARAX/VISTARIL) 10 MG tablet Take 1 tablet (10 mg total) by mouth 3 (three) times daily as needed. (Patient not taking: Reported on 12/04/2018) 30 tablet 0  . Multiple Vitamins-Minerals (MULTIVITAL PO) Take by mouth.    . Multiple Vitamins-Minerals (WOMENS HAIR, SKIN & NAILS PO) Take by mouth.    . Omega-3 1000 MG CAPS Take by mouth.    . Probiotic Product (PROBIOTIC ADVANCED PO) Take by mouth as needed.     No current facility-administered medications on file prior to visit.    Family History  Problem Relation Age of Onset  . Diabetes Mother   .  Hypertension Mother   . Heart disease Mother   . Cancer Father   . Diabetes Sister   . Hypertension Sister   . Heart disease Sister   . Hypertension Brother   . Diabetes Brother   . Cancer Maternal Grandmother   . Colon cancer Neg Hx   . Colon polyps Neg Hx   . Esophageal cancer Neg Hx   . Stomach cancer Neg Hx   . Rectal cancer Neg Hx   . Breast cancer Neg Hx    Social History   Socioeconomic History  . Marital status: Single    Spouse name: Not on file  . Number of children: 3  . Years of education: GED  . Highest education level: Not on file  Occupational History  . Occupation: International aid/development worker: [employer]  . Financial resource strain: Not on file  . Food insecurity:    Worry: Not on file    Inability: Not on file  . Transportation needs:    Medical: Not on file    Non-medical: Not on file  Tobacco Use  . Smoking status: Current Some Day Smoker    Types: Cigarettes    Last attempt to quit: 10/19/2011    Years since quitting: 7.1  . Smokeless tobacco: Never Used  Substance and Sexual Activity  . Alcohol use: No  . Drug use: No  . Sexual activity: Not  on file  Lifestyle  . Physical activity:    Days per week: Not on file    Minutes per session: Not on file  . Stress: Not on file  Relationships  . Social connections:    Talks on phone: Not on file    Gets together: Not on file    Attends religious service: Not on file    Active member of club or organization: Not on file    Attends meetings of clubs or organizations: Not on file    Relationship status: Not on file  . Intimate partner violence:    Fear of current or ex partner: Not on file    Emotionally abused: Not on file    Physically abused: Not on file    Forced sexual activity: Not on file  Other Topics Concern  . Not on file  Social History Narrative  . Not on file    Review of Systems  Constitutional: Negative.   HENT: Negative.   Eyes: Negative.   Respiratory:  Negative.   Cardiovascular: Negative.   Gastrointestinal: Negative.   Genitourinary: Negative.   Musculoskeletal: Negative.   Skin: Negative.   Neurological: Negative.   Psychiatric/Behavioral: Negative.      OBJECTIVE:    BP 134/79 (BP Location: Left Arm, Patient Position: Sitting, Cuff Size: Large)   Pulse 62   Temp 98.1 F (36.7 C) (Oral)   Resp 14   Ht 5\' 2"  (1.575 m)   Wt 217 lb (98.4 kg)   SpO2 100%   BMI 39.69 kg/m   Physical Exam  Constitutional: She is oriented to person, place, and time and well-developed, well-nourished, and in no distress. No distress.  HENT:  Head: Normocephalic and atraumatic.  Eyes: Pupils are equal, round, and reactive to light. Conjunctivae and EOM are normal.  Neck: Normal range of motion. Neck supple.  Cardiovascular: Normal rate, regular rhythm and intact distal pulses. Exam reveals no gallop and no friction rub.  No murmur heard. Pulmonary/Chest: Effort normal and breath sounds normal. No respiratory distress. She has no wheezes.  Abdominal: Soft. Bowel sounds are normal. There is no abdominal tenderness.  Musculoskeletal: Normal range of motion.        General: No tenderness or edema.  Lymphadenopathy:    She has no cervical adenopathy.  Neurological: She is alert and oriented to person, place, and time. Gait normal.  Skin: Skin is warm and dry.  Psychiatric: Mood, memory, affect and judgment normal.  Nursing note and vitals reviewed.    ASSESSMENT/PLAN:  1. Essential hypertension Patient would like to try hyzaar for 3-4 weeks. If not working, will go ack to her current treatment. Discussed benefits and side effects of medications. Reviewed lab results. Discussed diet and exercise.  - Urinalysis Dipstick - losartan-hydrochlorothiazide (HYZAAR) 50-12.5 MG tablet; Take 1 tablet by mouth daily.  Dispense: 30 tablet; Refill: 2    Return in about 3 months (around 03/04/2019), or BP check in 2 weeks. , for HTN.  The patient was  given clear instructions to go to ER or return to medical center if symptoms don't improve, worsen or new problems develop. The patient verbalized understanding. The patient was told to call to get lab results if they haven't heard anything in the next week.     This note has been created with Surveyor, quantity. Any transcriptional errors are unintentional.   Ms. Andr L. Nathaneil Canary, FNP-BC [facility]  [address]  [place], [place] [address] [phone]

## 2018-12-18 ENCOUNTER — Ambulatory Visit: Payer: BC Managed Care – PPO | Admitting: Family Medicine

## 2018-12-18 VITALS — BP 124/66 | HR 88 | Wt 217.0 lb

## 2018-12-18 DIAGNOSIS — Z0130 Encounter for examination of blood pressure without abnormal findings: Secondary | ICD-10-CM

## 2018-12-18 NOTE — Progress Notes (Signed)
Patient advise to continue her medication and keep follow up.

## 2018-12-31 ENCOUNTER — Other Ambulatory Visit: Payer: Self-pay | Admitting: Family Medicine

## 2018-12-31 NOTE — Telephone Encounter (Signed)
Requested Prescriptions  Pending Prescriptions Disp Refills  . hydrochlorothiazide (HYDRODIURIL) 12.5 MG tablet [Pharmacy Med Name: HYDROCHLOROTHIAZIDE 12.5MG  TABLETS] 90 tablet 0    Sig: TAKE 1 TABLET(12.5 MG) BY MOUTH DAILY     Cardiovascular: Diuretics - Thiazide Passed - 12/31/2018  7:52 AM      Passed - Ca in normal range and within 360 days    Calcium  Date Value Ref Range Status  10/30/2018 9.6 8.7 - 10.3 mg/dL Final         Passed - Cr in normal range and within 360 days    Creat  Date Value Ref Range Status  05/16/2016 0.88 0.50 - 0.99 mg/dL Final    Comment:      For patients > or = 63 years of age: The upper reference limit for Creatinine is approximately 13% higher for people identified as African-American.      Creatinine, Ser  Date Value Ref Range Status  10/30/2018 0.69 0.57 - 1.00 mg/dL Final         Passed - K in normal range and within 360 days    Potassium  Date Value Ref Range Status  10/30/2018 4.3 3.5 - 5.2 mmol/L Final         Passed - Na in normal range and within 360 days    Sodium  Date Value Ref Range Status  10/30/2018 139 134 - 144 mmol/L Final         Passed - Last BP in normal range    BP Readings from Last 1 Encounters:  12/18/18 124/66         Passed - Valid encounter within last 6 months    Recent Outpatient Visits          2 months ago Fatigue, unspecified type   [facility], Ranell Patrick, MD   4 months ago Routine general medical examination at a health care facility   [facility], Lilia Argue, MD   5 months ago Chronic pain of both knees   [facility], Lilia Argue, MD   10 months ago Essential hypertension   Primary Care at [facility], [place], [place]   10 months ago Essential hypertension   Primary Care at [facility], [place], [place]

## 2019-01-03 ENCOUNTER — Other Ambulatory Visit: Payer: Self-pay | Admitting: Urgent Care

## 2019-02-06 ENCOUNTER — Ambulatory Visit: Payer: BC Managed Care – PPO | Admitting: Family Medicine

## 2019-02-17 ENCOUNTER — Ambulatory Visit: Payer: Self-pay | Admitting: Family Medicine

## 2019-02-20 ENCOUNTER — Ambulatory Visit: Payer: Self-pay | Admitting: Family Medicine

## 2019-03-05 ENCOUNTER — Encounter: Payer: Self-pay | Admitting: Family Medicine

## 2019-03-05 ENCOUNTER — Ambulatory Visit: Payer: BC Managed Care – PPO | Admitting: Family Medicine

## 2019-03-05 ENCOUNTER — Other Ambulatory Visit: Payer: Self-pay

## 2019-03-05 VITALS — BP 138/79 | HR 85 | Temp 98.4°F | Resp 16 | Ht 62.0 in | Wt 221.0 lb

## 2019-03-05 DIAGNOSIS — I10 Essential (primary) hypertension: Secondary | ICD-10-CM

## 2019-03-05 DIAGNOSIS — J309 Allergic rhinitis, unspecified: Secondary | ICD-10-CM | POA: Diagnosis not present

## 2019-03-05 LAB — POCT URINALYSIS DIPSTICK
Bilirubin, UA: NEGATIVE
Blood, UA: NEGATIVE
Glucose, UA: NEGATIVE
Ketones, UA: NEGATIVE
Nitrite, UA: NEGATIVE
Protein, UA: NEGATIVE
Spec Grav, UA: 1.02 (ref 1.010–1.025)
Urobilinogen, UA: 0.2 E.U./dL
pH, UA: 7 (ref 5.0–8.0)

## 2019-03-05 MED ORDER — HYDROCHLOROTHIAZIDE 12.5 MG PO TABS: ORAL_TABLET | ORAL | 3 refills | Status: DC

## 2019-03-05 MED ORDER — AMLODIPINE BESYLATE 5 MG PO TABS: ORAL_TABLET | ORAL | 3 refills | Status: DC

## 2019-03-05 MED ORDER — LEVOCETIRIZINE DIHYDROCHLORIDE 5 MG PO TABS: 5.0000 mg | ORAL_TABLET | Freq: Every evening | ORAL | 2 refills | Status: DC

## 2019-03-05 MED ORDER — FLUTICASONE PROPIONATE 50 MCG/ACT NA SUSP: 2.0000 | Freq: Every day | NASAL | 6 refills | Status: DC

## 2019-03-05 NOTE — Patient Instructions (Addendum)
Postnasal Drip Postnasal drip is the feeling of mucus going down the back of your throat. Mucus is a slimy substance that moistens and cleans your nose and throat, as well as the air pockets in face bones near your forehead and cheeks (sinuses). Small amounts of mucus pass from your nose and sinuses down the back of your throat all the time. This is normal. When you produce too much mucus or the mucus gets too thick, you can feel it. Some common causes of postnasal drip include:  Having more mucus because of: ? A cold or the flu. ? Allergies. ? Cold air. ? Certain medicines.  Having more mucus that is thicker because of: ? A sinus or nasal infection. ? Dry air. ? A food allergy. Follow these instructions at home: Relieving discomfort   Gargle with a salt-water mixture 3-4 times a day or as needed. To make a salt-water mixture, completely dissolve -1 tsp of salt in 1 cup of warm water.  If the air in your home is dry, use a humidifier to add moisture to the air.  Use a saline spray or container (neti pot) to flush out the nose (nasal irrigation). These methods can help clear away mucus and keep the nasal passages moist. General instructions  Take over-the-counter and prescription medicines only as told by your health care provider.  Follow instructions from your health care provider about eating or drinking restrictions. You may need to avoid caffeine.  Avoid things that you know you are allergic to (allergens), like dust, mold, pollen, pets, or certain foods.  Drink enough fluid to keep your urine pale yellow.  Keep all follow-up visits as told by your health care provider. This is important. Contact a health care provider if:  You have a fever.  You have a sore throat.  You have difficulty swallowing.  You have headache.  You have sinus pain.  You have a cough that does not go away.  The mucus from your nose becomes thick and is green or yellow in color.  You have  cold or flu symptoms that last more than 10 days. Summary  Postnasal drip is the feeling of mucus going down the back of your throat.  If your health care provider approves, use nasal irrigation or a nasal spray 2?4 times a day.  Avoid things that you know you are allergic to (allergens), like dust, mold, pollen, pets, or certain foods. This information is not intended to replace advice given to you by your health care provider. Make sure you discuss any questions you have with your health care provider. Document Released: 01/15/2017 Document Revised: 01/15/2017 Document Reviewed: 01/15/2017 Elsevier Interactive Patient Education  2019 [employer].   How to Perform a Sinus Rinse A sinus rinse is a home treatment. It rinses your sinuses with a mixture of salt and water (saline solution). Sinuses are air-filled spaces in your skull behind the bones of your face and forehead. They open into your nasal cavity. A sinus rinse can help to clear your nasal cavity. It can clear mucus, dirt, dust, or pollen. You may do a sinus rinse when you have:  A cold.  A virus.  Allergies.  A sinus infection.  A stuffy nose. Talk with your doctor about whether a sinus rinse might help you. What are the risks? A sinus rinse is normally very safe and helpful. However, there are a few risks. These include:  A burning feeling in the sinuses. This may happen if  you do not make the saline solution as instructed. Be sure to follow all directions when making the saline solution.  Nasal irritation.  Infection from unclean water. This is rare, but possible. Do not do a sinus rinse if you have had:  Ear or nasal surgery.  An ear infection.  Blocked ears. Supplies needed:  Saline solution or powder.  Distilled or germ-free (sterile) water may be needed to mix with saline powder. ? You may use boiled and cooled tap water. Boil tap water for 5 minutes; cool until it is lukewarm. Use within 24 hours. ?  Do not use regular tap water to mix with the saline solution.  Neti pot or nasal rinse bottle. This releases the saline solution into your nose and through your sinuses. You can buy neti pots and rinse bottles: ? At your local pharmacy. ? At a health food store. ? Online. How to perform a sinus rinse  1. Wash your hands with soap and water. 2. Wash your device using the directions that came with it. 3. Dry your device. 4. Use the solution that comes with your device or one that is sold separately in stores. Follow the mixing directions on the package if you need to mix with sterile or distilled water. 5. Fill your device with the amount of saline solution stated in the device instructions. 6. Stand over a sink and tilt your head sideways over the sink. 7. Place the spout of the device in your upper nostril (the one closer to the ceiling). 8. Gently pour or squeeze the saline solution into your nasal cavity. The liquid should drain to your lower nostril if you are not too stuffed up (congested). 9. While rinsing, breathe through your open mouth. 10. Gently blow your nose to clear any mucus and rinse solution. Blowing too hard may cause ear pain. 11. Repeat in your other nostril. 12. Clean and rinse your device with clean water. 13. Air-dry your device. Talk with your doctor or pharmacist if you have questions about how to do a sinus rinse. Summary  A sinus rinse is a home treatment. It rinses your sinuses with a mixture of salt and water (saline solution).  A sinus rinse is normally very safe and helpful. Follow all instructions carefully.  Talk with your doctor about whether a sinus rinse might help you. This information is not intended to replace advice given to you by your health care provider. Make sure you discuss any questions you have with your health care provider. Document Released: 04/29/2014 Document Revised: 07/30/2017 Document Reviewed: 07/30/2017 Elsevier Interactive  Patient Education  2019 [employer].

## 2019-03-05 NOTE — Progress Notes (Signed)
[facility]   Progress Note: General Provider: Lanae Boast, FNP  SUBJECTIVE:   Doris Green is a 63 y.o. 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 Patient presents today for Hypertension and Sinus Problem (feels likes sinus are stopped up ) Since the last visit, patient stopped taking hyzaar due to "making me crazy". She is back on amlodipine and hctz without problems or side effects. Patient states that she is having nasal congestion x 1 week. She states that she has not taken medication for this due to having HTN. She denies sinus pain and sore throat.    Review of Systems  Constitutional: Negative.   HENT: Positive for congestion. Negative for sinus pain and sore throat.   Eyes: Negative.   Respiratory: Negative.   Cardiovascular: Negative.   Gastrointestinal: Negative.   Genitourinary: Negative.   Musculoskeletal: Negative.   Skin: Negative.   Neurological: Negative.   Psychiatric/Behavioral: Negative.      OBJECTIVE: BP 138/79 (BP Location: Right Arm, Patient Position: Sitting, Cuff Size: Large)   Pulse 85   Temp 98.4 F (36.9 C) (Oral)   Resp 16   Ht 5\' 2"  (1.575 m)   Wt 221 lb (100.2 kg)   SpO2 100%   BMI 40.42 kg/m   Wt Readings from Last 3 Encounters:  03/05/19 221 lb (100.2 kg)  12/18/18 217 lb (98.4 kg)  12/04/18 217 lb (98.4 kg)     Physical Exam Vitals signs and nursing note reviewed.  Constitutional:      General: She is not in acute distress.    Appearance: Normal appearance.  HENT:     Head: Normocephalic and atraumatic.     Nose: Congestion present.     Mouth/Throat:     Mouth: Mucous membranes are moist.     Pharynx: Oropharynx is clear. No oropharyngeal exudate or posterior oropharyngeal erythema.  Eyes:     Extraocular Movements: Extraocular movements intact.   Conjunctiva/sclera: Conjunctivae normal.     Pupils: Pupils are equal, round, and reactive to light.  Cardiovascular:     Rate and Rhythm: Normal rate and regular rhythm.     Heart sounds: No murmur.  Pulmonary:     Effort: Pulmonary effort is normal.     Breath sounds: Normal breath sounds.  Musculoskeletal: Normal range of motion.  Skin:    General: Skin is warm and dry.  Neurological:     Mental Status: She is alert and oriented to person, place, and time.  Psychiatric:        Mood and Affect: Mood normal.        Behavior: Behavior normal.        Thought Content: Thought content normal.        Judgment: Judgment normal.     ASSESSMENT/PLAN:   1. Essential hypertension Continue with current medications. Labs at next visit.  - Urinalysis Dipstick - amLODipine (NORVASC) 5 MG tablet; TAKE 1 TABLET(5 MG) BY MOUTH DAILY  Dispense: 90 tablet; Refill: 3 - hydrochlorothiazide (HYDRODIURIL) 12.5 MG tablet; TAKE 1 TABLET(12.5 MG) BY MOUTH DAILY  Dispense: 90 tablet; Refill: 3  2. Allergic rhinitis, unspecified seasonality, unspecified trigger Nasal Lavage recommended.  - levocetirizine (XYZAL) 5 MG tablet; Take 1 tablet (5 mg total) by mouth every evening.  Dispense: 30 tablet; Refill: 2 - fluticasone (FLONASE) 50 MCG/ACT nasal spray; Place 2 sprays into both  nostrils daily.  Dispense: 16 g; Refill: 6     Return in about 6 months (around 09/05/2019), or if symptoms worsen or fail to improve, for HTN.    The patient was given clear instructions to go to ER or return to medical center if symptoms do not improve, worsen or new problems develop. The patient verbalized understanding and agreed with plan of care.   Ms. Doug Sou. Nathaneil Canary, FNP-BC [facility] [address], [place] [address] [phone]

## 2019-04-10 ENCOUNTER — Other Ambulatory Visit: Payer: Self-pay

## 2019-04-10 ENCOUNTER — Encounter: Payer: Self-pay | Admitting: Family Medicine

## 2019-04-10 ENCOUNTER — Ambulatory Visit (INDEPENDENT_AMBULATORY_CARE_PROVIDER_SITE_OTHER): Payer: BC Managed Care – PPO | Admitting: Family Medicine

## 2019-04-10 VITALS — BP 124/74 | HR 88 | Temp 98.5°F | Resp 16 | Ht 62.0 in | Wt 220.0 lb

## 2019-04-10 DIAGNOSIS — M25571 Pain in right ankle and joints of right foot: Secondary | ICD-10-CM

## 2019-04-10 DIAGNOSIS — G8929 Other chronic pain: Secondary | ICD-10-CM

## 2019-04-10 DIAGNOSIS — M25572 Pain in left ankle and joints of left foot: Secondary | ICD-10-CM

## 2019-04-10 MED ORDER — NAPROXEN 500 MG PO TABS: 500.0000 mg | ORAL_TABLET | Freq: Two times a day (BID) | ORAL | 0 refills | Status: AC

## 2019-04-10 NOTE — Progress Notes (Signed)
  [facility]   Progress Note: Sick Visit Provider: Lanae Boast, FNP  SUBJECTIVE:   Doris Green is a 63 y.o. 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 Patient presents today for Joint Swelling (right ankle swelling ) Ankle Pain  The incident occurred more than 1 week ago. There was no injury mechanism. The pain is present in the right ankle and left ankle. The quality of the pain is described as aching. The pain is at a severity of 4/10. The pain has been improving since onset. She reports no foreign bodies present. The symptoms are aggravated by weight bearing. She has tried nothing for the symptoms.    Review of Systems  Musculoskeletal: Positive for joint pain (bilateral ankles R>L).  All other systems reviewed and are negative.    OBJECTIVE: BP 124/74 (BP Location: Left Arm, Patient Position: Sitting, Cuff Size: Large)   Pulse 88   Temp 98.5 F (36.9 C) (Oral)   Resp 16   Ht 5\' 2"  (1.575 m)   Wt 220 lb (99.8 kg)   SpO2 100%   BMI 40.24 kg/m   Wt Readings from Last 3 Encounters:  04/10/19 220 lb (99.8 kg)  03/05/19 221 lb (100.2 kg)  12/18/18 217 lb (98.4 kg)     Physical Exam Vitals signs and nursing note reviewed.  Constitutional:      General: She is not in acute distress.    Appearance: Normal appearance.  HENT:     Head: Normocephalic and atraumatic.  Musculoskeletal: Normal range of motion.     Right ankle: She exhibits swelling (mild non pitting edema).  Neurological:     Mental Status: She is alert and oriented to person, place, and time.  Psychiatric:        Mood and Affect: Mood normal.        Behavior: Behavior normal.        Thought Content: Thought content normal.        Judgment: Judgment normal.     ASSESSMENT/PLAN:   1. Chronic pain of both ankles Patient reports being able  to take naproxen without problem. RICE therapy recommended. Ace bandage placed on the right ankle.   - naproxen (NAPROSYN) 500 MG tablet; Take 1 tablet (500 mg total) by mouth 2 (two) times daily with a meal for 10 days.  Dispense: 20 tablet; Refill: 0    Take tylenol 650 mg three times a day is the best evidence based medicine we have for arthritis.  Glucosamine sulfate 750mg  twice a day is a supplement that has been shown to help moderate to severe arthritis. Vitamin D 2000 IU daily Fish oil 2 grams daily.  Tumeric 500mg  twice daily.  Capsaicin topically up to four times a day may also help with pain. Make sure you apply ice to the area.     The patient was given clear instructions to go to ER or return to medical center if symptoms do not improve, worsen or new problems develop. The patient verbalized understanding and agreed with plan of care.   Ms. Doug Sou. Nathaneil Canary, FNP-BC [facility] [address] [place], [place] [address] [phone]     This note has been created with Dragon speech recognition software and smart phrase technology. Any transcriptional errors are unintentional.

## 2019-04-10 NOTE — Patient Instructions (Addendum)
Take tylenol 650 mg three times a day is the best evidence based medicine we have for arthritis.  Glucosamine sulfate 750mg  twice a day is a supplement that has been shown to help moderate to severe arthritis. Vitamin D 2000 IU daily Fish oil 2 grams daily.  Tumeric 500mg  twice daily.  Capsaicin topically up to four times a day may also help with pain. Make sure you apply ice to the area.   Ankle Pain The ankle joint holds your body weight and allows you to move around. Ankle pain can occur on either side or the back of one ankle or both ankles. Ankle pain may be sharp and burning or dull and aching. There may be tenderness, stiffness, redness, or warmth around the ankle. Many things can cause ankle pain, including an injury to the area and overuse of the ankle. Follow these instructions at home: Activity  Rest your ankle as told by your health care provider. Avoid any activities that cause ankle pain.  Do not use the injured limb to support your body weight until your health care provider says that you can. Use crutches as told by your health care provider.  Do exercises as told by your health care provider.  Ask your health care provider when it is safe to drive if you have a brace on your ankle. If you have a brace:  Wear the brace as told by your health care provider. Remove it only as told by your health care provider.  Loosen the brace if your toes tingle, become numb, or turn cold and blue.  Keep the brace clean.  If the brace is not waterproof: ? Do not let it get wet. ? Cover it with a watertight covering when you take a bath or shower. If you were given an elastic bandage:   Remove it when you take a bath or a shower.  Try not to move your ankle very much, but wiggle your toes from time to time. This helps to prevent swelling.  Adjust the bandage to make it more comfortable if it feels too tight.  Loosen the bandage if you have numbness or tingling in your foot or if  your foot turns cold and blue. Managing pain, stiffness, and swelling   If directed, put ice on the painful area. ? If you have a removable brace or elastic bandage, remove it as told by your health care provider. ? Put ice in a plastic bag. ? Place a towel between your skin and the bag. ? Leave the ice on for 20 minutes, 2-3 times a day.  Move your toes often to avoid stiffness and to lessen swelling.  Raise (elevate) your ankle above the level of your heart while you are sitting or lying down. General instructions  Record information about your pain. Writing down the following may be helpful for you and your health care provider: ? How often you have ankle pain. ? Where the pain is located. ? What the pain feels like.  If treatment involves wearing a prescribed shoe or insole, make sure you wear it correctly and for as long as told by your health care provider.  Take over-the-counter and prescription medicines only as told by your health care provider.  Keep all follow-up visits as told by your health care provider. This is important. Contact a health care provider if:  Your pain gets worse.  Your pain is not relieved with medicines.  You have a fever or chills.  You  are having more trouble with walking.  You have new symptoms. Get help right away if:  Your foot, leg, toes, or ankle: ? Tingles or becomes numb. ? Becomes swollen. ? Turns pale or blue. Summary  Ankle pain can occur on either side or the back of one ankle or both ankles.  Ankle pain may be sharp and burning or dull and aching.  Rest your ankle as told by your health care provider. If told, apply ice to the area.  Take over-the-counter and prescription medicines only as told by your health care provider. This information is not intended to replace advice given to you by your health care provider. Make sure you discuss any questions you have with your health care provider. Document Released:  03/22/2010 Document Revised: 04/10/2018 Document Reviewed: 04/10/2018 Elsevier Interactive Patient Education  2019 [employer].

## 2019-06-25 ENCOUNTER — Encounter (HOSPITAL_COMMUNITY): Payer: Self-pay | Admitting: *Deleted

## 2019-06-25 ENCOUNTER — Encounter (HOSPITAL_COMMUNITY): Payer: Self-pay

## 2019-08-12 ENCOUNTER — Other Ambulatory Visit: Payer: Self-pay

## 2019-08-12 ENCOUNTER — Encounter: Payer: Self-pay | Admitting: Family Medicine

## 2019-08-12 ENCOUNTER — Ambulatory Visit (INDEPENDENT_AMBULATORY_CARE_PROVIDER_SITE_OTHER): Payer: BC Managed Care – PPO | Admitting: Family Medicine

## 2019-08-12 VITALS — BP 134/75 | HR 73 | Temp 98.6°F | Resp 16 | Ht 62.0 in | Wt 224.0 lb

## 2019-08-12 DIAGNOSIS — G8929 Other chronic pain: Secondary | ICD-10-CM

## 2019-08-12 DIAGNOSIS — M25561 Pain in right knee: Secondary | ICD-10-CM | POA: Diagnosis not present

## 2019-08-12 DIAGNOSIS — J3089 Other allergic rhinitis: Secondary | ICD-10-CM | POA: Diagnosis not present

## 2019-08-12 DIAGNOSIS — R7303 Prediabetes: Secondary | ICD-10-CM | POA: Diagnosis not present

## 2019-08-12 DIAGNOSIS — I10 Essential (primary) hypertension: Secondary | ICD-10-CM

## 2019-08-12 LAB — POCT GLYCOSYLATED HEMOGLOBIN (HGB A1C): Hemoglobin A1C: 5.7 % — AB (ref 4.0–5.6)

## 2019-08-12 MED ORDER — LORATADINE 10 MG PO TABS: 10.0000 mg | ORAL_TABLET | Freq: Every day | ORAL | 11 refills | Status: DC

## 2019-08-12 MED ORDER — MELOXICAM 7.5 MG PO TABS: 7.5000 mg | ORAL_TABLET | Freq: Every day | ORAL | 1 refills | Status: DC

## 2019-08-12 MED ORDER — KETOROLAC TROMETHAMINE 30 MG/ML IJ SOLN
30.0000 mg | Freq: Once | INTRAMUSCULAR | Status: AC
  Administered 2019-08-12: 30 mg via INTRAMUSCULAR

## 2019-08-12 MED ORDER — HYDROCHLOROTHIAZIDE 12.5 MG PO TABS: ORAL_TABLET | ORAL | 1 refills | Status: DC

## 2019-08-12 NOTE — Progress Notes (Signed)
Established Patient Office Visit  Subjective:  Patient ID: Doris Green, female    DOB: 23-Apr-1956  Age: 63 y.o. MRN: [id number]  CC:  Chief Complaint  Patient presents with  . Foot Swelling    foot swelling in right foot   . Ear Fullness    ears feel clogged   . Leg Pain    right leg     HPI Doneshia Green, a 63 year old female with a medical history significant for hypertension, depression, osteoarthritis to multiple sites, and morbid obesity present with complaints of right foot swelling, ear fullness, and right knee pain.   Patient is complaining of right lower extremity pain and swelling over the past several months. She denies any inciting events. 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 She says that she often awakens with increased swelling. Weight bearing is difficult at times. Current pain intensity is 7/10 characterized as intermittent and aching. She says that she has not attempted any OTC interventions to alleviate this problem.   [name] is complaining of fullness of ears bilaterally over the past several months. She says that her ears often feel "full" and she feels off balance. She endorses runny eyes and runny nose year round. She has not identified any palliative interventions for this problem. Patient was on Xyzal for environmental allergies in the past, but stopped taking due to excessive sleepiness. She denies sore throat, persistent cough, headache, chest pain, shortness of breath, nausea, vomiting, or diarrhea.   [name] has a history of hypertension. She says that an additional antihypertensive medication was added at previous appointment. She says that she does not take medications consistently. When she takes both medications, she experiences dizziness. [name] does not check blood pressure at home. She does not exercise or follow a low fat, low sodium diet. Her cardiac risk factors  include obesity, hypertension, and prediabetes.   Past Medical History:  Diagnosis Date  . Allergy   . Arthritis   . Depression   . History of blood transfusion   . Spider bite    2016 and 2017  . UTERINE FIBROID 12/13/2006    Past Surgical History:  Procedure Laterality Date  . SHOULDER SURGERY    . TUBAL LIGATION      Family History  Problem Relation Age of Onset  . Diabetes Mother   . Hypertension Mother   . Heart disease Mother   . Cancer Father   . Diabetes Sister   . Hypertension Sister   . Heart disease Sister   . Hypertension Brother   . Diabetes Brother   . Cancer Maternal Grandmother   . Colon cancer Neg Hx   . Colon polyps Neg Hx   . Esophageal cancer Neg Hx   . Stomach cancer Neg Hx   . Rectal cancer Neg Hx   . Breast cancer Neg Hx     Social History   Socioeconomic History  . Marital status: Single    Spouse name: Not on file  . Number of children: 3  . Years of education: GED  . Highest education level: Not on file  Occupational History  . Occupation: International aid/development worker: [employer]  . Financial resource strain: Not on file  . Food insecurity    Worry: Not on file    Inability: Not on file  . Transportation needs    Medical: Not  on file    Non-medical: Not on file  Tobacco Use  . Smoking status: Current Some Day Smoker    Types: Cigarettes    Last attempt to quit: 10/19/2011    Years since quitting: 7.8  . Smokeless tobacco: Never Used  Substance and Sexual Activity  . Alcohol use: No  . Drug use: No  . Sexual activity: Not on file  Lifestyle  . Physical activity    Days per week: Not on file    Minutes per session: Not on file  . Stress: Not on file  Relationships  . Social Herbalist on phone: Not on file    Gets together: Not on file    Attends religious service: Not on file    Active member of club or organization: Not on file    Attends meetings of clubs or organizations: Not on file     Relationship status: Not on file  . Intimate partner violence    Fear of current or ex partner: Not on file    Emotionally abused: Not on file    Physically abused: Not on file    Forced sexual activity: Not on file  Other Topics Concern  . Not on file  Social History Narrative  . Not on file    Outpatient Medications Prior to Visit  Medication Sig Dispense Refill  . Ascorbic Acid (VITAMIN C) 1000 MG tablet Take 1,000 mg by mouth daily.    . cholecalciferol (VITAMIN D3) 25 MCG (1000 UT) tablet Take 1,000 Units by mouth daily.    . fluticasone (FLONASE) 50 MCG/ACT nasal spray Place 2 sprays into both nostrils daily. 16 g 6  . Multiple Vitamins-Minerals (MULTIVITAL PO) Take by mouth.    . Multiple Vitamins-Minerals (WOMENS HAIR, SKIN & NAILS PO) Take by mouth.    . Omega-3 1000 MG CAPS Take by mouth.    . Probiotic Product (PROBIOTIC ADVANCED PO) Take by mouth as needed.    Marland Kitchen amLODipine (NORVASC) 5 MG tablet TAKE 1 TABLET(5 MG) BY MOUTH DAILY 90 tablet 3  . hydrochlorothiazide (HYDRODIURIL) 12.5 MG tablet TAKE 1 TABLET(12.5 MG) BY MOUTH DAILY 90 tablet 3  . levocetirizine (XYZAL) 5 MG tablet Take 1 tablet (5 mg total) by mouth every evening. 30 tablet 2   No facility-administered medications prior to visit.     Allergies  Allergen Reactions  . Ibuprofen     Blood in stool    ROS Review of Systems  Constitutional: Negative for activity change and appetite change.  HENT: Negative.   Eyes: Negative.   Respiratory: Negative.   Cardiovascular: Negative.  Negative for chest pain, palpitations and leg swelling.  Endocrine: Negative.  Negative for polydipsia, polyphagia and polyuria.  Genitourinary: Negative.   Musculoskeletal: Positive for arthralgias, gait problem and joint swelling (right knee).  Allergic/Immunologic: Negative for immunocompromised state.  Hematological: Negative.   Psychiatric/Behavioral: Negative.       Objective:    Physical Exam  Constitutional: She  appears well-developed and well-nourished.  HENT:  Head: Normocephalic and atraumatic.  Right Ear: Hearing, tympanic membrane and external ear normal.  Left Ear: Hearing, tympanic membrane and external ear normal.  Right ear dull TM, fluid  Neck: Normal range of motion.  Cardiovascular: Normal rate and regular rhythm.  Trace edema to feet bilaterally  Pulmonary/Chest: Effort normal and breath sounds normal.  Abdominal: Soft. Bowel sounds are normal.  Musculoskeletal:     Right knee: She exhibits decreased range of motion  and swelling. She exhibits no ecchymosis and no erythema.    BP 134/75 (BP Location: Left Arm, Patient Position: Sitting, Cuff Size: Normal)   Pulse 73   Temp 98.6 F (37 C) (Oral)   Resp 16   Ht 5\' 2"  (1.575 m)   Wt 224 lb (101.6 kg)   SpO2 100%   BMI 40.97 kg/m  Wt Readings from Last 3 Encounters:  08/12/19 224 lb (101.6 kg)  04/10/19 220 lb (99.8 kg)  03/05/19 221 lb (100.2 kg)     Health Maintenance Due  Topic Date Due  . PAP SMEAR-Modifier  05/17/2019    There are no preventive care reminders to display for this patient.  Lab Results  Component Value Date   TSH 1.960 10/30/2018   Lab Results  Component Value Date   WBC 8.3 10/30/2018   HGB 11.9 10/30/2018   HCT 37.0 10/30/2018   MCV 79.2 10/30/2018   PLT 291 08/07/2018   Lab Results  Component Value Date   NA 139 10/30/2018   K 4.3 10/30/2018   CO2 23 10/30/2018   GLUCOSE 86 10/30/2018   BUN 11 10/30/2018   CREATININE 0.69 10/30/2018   BILITOT 0.4 08/07/2018   ALKPHOS 82 08/07/2018   AST 17 08/07/2018   ALT 15 08/07/2018   PROT 6.8 08/07/2018   ALBUMIN 4.5 08/07/2018   CALCIUM 9.6 10/30/2018   Lab Results  Component Value Date   CHOL 198 08/07/2018   Lab Results  Component Value Date   HDL 78 08/07/2018   Lab Results  Component Value Date   LDLCALC 101 (H) 08/07/2018   Lab Results  Component Value Date   TRIG 94 08/07/2018   Lab Results  Component Value Date    CHOLHDL 2.5 08/07/2018   Lab Results  Component Value Date   HGBA1C 5.8 (H) 08/07/2018      Assessment & Plan:   Problem List Items Addressed This Visit      Cardiovascular and Mediastinum   Essential hypertension   Relevant Medications   hydrochlorothiazide (HYDRODIURIL) 12.5 MG tablet   Other Relevant Orders   Orthostatic vital signs   Basic Metabolic Panel    Other Visit Diagnoses    Allergic rhinitis due to other allergic trigger, unspecified seasonality    -  Primary   Relevant Medications   loratadine (CLARITIN) 10 MG tablet   Chronic pain of right knee       Relevant Medications   meloxicam (MOBIC) 7.5 MG tablet   Prediabetes       Relevant Orders   HgB A1c      Meds ordered this encounter  Medications  . loratadine (CLARITIN) 10 MG tablet    Sig: Take 1 tablet (10 mg total) by mouth daily.    Dispense:  30 tablet    Refill:  11    Order Specific Question:   Supervising Provider    Answer:   Tresa Garter [id number]  . meloxicam (MOBIC) 7.5 MG tablet    Sig: Take 1 tablet (7.5 mg total) by mouth daily.    Dispense:  30 tablet    Refill:  1    Order Specific Question:   Supervising Provider    Answer:   Tresa Garter [id number]  . hydrochlorothiazide (HYDRODIURIL) 12.5 MG tablet    Sig: TAKE 1 TABLET(12.5 MG) BY MOUTH DAILY    Dispense:  90 tablet    Refill:  1    Order Specific Question:  Supervising Provider    Answer:   Tresa Garter [id number]   Allergic rhinitis due to other allergic trigger, unspecified seasonality - loratadine (CLARITIN) 10 MG tablet; Take 1 tablet (10 mg total) by mouth daily.  Dispense: 30 tablet; Refill: 11  Chronic pain of right knee Patient has taken meloxicam in the past without complication.  Will start a trial of meloxicam patient advised to take medication with food to prevent GI upset.  - meloxicam (MOBIC) 7.5 MG tablet; Take 1 tablet (7.5 mg total) by mouth daily.  Dispense: 30 tablet; Refill: 1 -  ketorolac (TORADOL) 30 MG/ML injection 30 mg  Essential hypertension Continue medication, monitor blood pressure at home. Continue DASH diet. Reminder to go to the ER if any CP, SOB, nausea, dizziness, severe HA, changes vision/speech, left arm numbness and tingling and jaw pain.    - hydrochlorothiazide (HYDRODIURIL) 12.5 MG tablet; TAKE 1 TABLET(12.5 MG) BY MOUTH DAILY  Dispense: 90 tablet; Refill: 1 - Orthostatic vital signs - Basic Metabolic Panel   Prediabetes Hemoglobin a1C is 5.8. Discussed diet and exercise regimen at length.  Recommend carbohydrate modified diet divided over 5-6 small meals throughout the day. Also, recommend increasing water intake and decreasing sugary drinks and food.  - HgB A1c  Morbid obesity, unspecified obesity type ([place]) Last Weight  Most recent update: 08/12/2019  1:06 PM   Weight  101.6 kg (224 lb)          Body mass index is 40.97 kg/m.  The patient is asked to make an attempt to improve diet and exercise patterns to aid in medical management of this problem.   Follow-up: 1 week for BP check and 1 month for hyperension   Donia Pounds  APRN, MSN, FNP-C Patient [facility] [address] [place], [place] [address] [phone]

## 2019-08-12 NOTE — Patient Instructions (Addendum)
1. Right knee pain:  Meloxicam 7.5 mg daily with food.   2. Prediabetes Hemoglobin a1C. Recommend low fat, low carbohydrates divided over 5-6 small meals throughout the day. 150 minutes of low impact exercise. 4-5 bottles per day.  Lose 5% of body weight.   3. Allergic rhinitis:  Daily loratadine 10 mg.

## 2019-08-13 LAB — BASIC METABOLIC PANEL
BUN/Creatinine Ratio: 14 (ref 12–28)
BUN: 13 mg/dL (ref 8–27)
CO2: 23 mmol/L (ref 20–29)
Calcium: 9.6 mg/dL (ref 8.7–10.3)
Chloride: 106 mmol/L (ref 96–106)
Creatinine, Ser: 0.94 mg/dL (ref 0.57–1.00)
GFR calc Af Amer: 75 mL/min/{1.73_m2} (ref >59)
GFR calc non Af Amer: 65 mL/min/{1.73_m2} (ref >59)
Glucose: 87 mg/dL (ref 65–99)
Potassium: 4.1 mmol/L (ref 3.5–5.2)
Sodium: 143 mmol/L (ref 134–144)

## 2019-08-15 ENCOUNTER — Telehealth: Payer: Self-pay

## 2019-08-15 NOTE — Telephone Encounter (Signed)
-----   Message from Dorena Dew, Moore sent at 08/14/2019  4:23 PM EDT ----- Regarding: lab results Please inform patient that all laboratory values are within normal range, including kidney functioning.  Please follow-up as scheduled.Donia Pounds  APRN, MSN, FNP-C Patient [facility] [address] [place],  [address] [phone]

## 2019-08-15 NOTE — Telephone Encounter (Signed)
Called, spoke with patient and advised that labs are within normal range and to keep next scheduled appointment. Thanks!

## 2019-08-16 DIAGNOSIS — G8929 Other chronic pain: Secondary | ICD-10-CM | POA: Insufficient documentation

## 2019-08-16 DIAGNOSIS — J309 Allergic rhinitis, unspecified: Secondary | ICD-10-CM | POA: Insufficient documentation

## 2019-08-16 DIAGNOSIS — R7303 Prediabetes: Secondary | ICD-10-CM | POA: Insufficient documentation

## 2019-09-05 ENCOUNTER — Ambulatory Visit: Payer: Self-pay | Admitting: Family Medicine

## 2019-09-19 ENCOUNTER — Other Ambulatory Visit: Payer: Self-pay | Admitting: Family Medicine

## 2019-09-19 DIAGNOSIS — Z1231 Encounter for screening mammogram for malignant neoplasm of breast: Secondary | ICD-10-CM

## 2019-09-27 ENCOUNTER — Ambulatory Visit
Admission: RE | Admit: 2019-09-27 | Discharge: 2019-09-27 | Disposition: A | Discharge status: Home / Self Care | Payer: BC Managed Care – PPO | Source: Ambulatory Visit | Attending: Family Medicine | Admitting: Family Medicine

## 2019-09-27 ENCOUNTER — Other Ambulatory Visit: Payer: Self-pay

## 2019-09-27 DIAGNOSIS — Z1231 Encounter for screening mammogram for malignant neoplasm of breast: Secondary | ICD-10-CM

## 2019-10-21 ENCOUNTER — Ambulatory Visit: Payer: Self-pay | Admitting: Family Medicine

## 2020-01-14 ENCOUNTER — Other Ambulatory Visit: Payer: Self-pay

## 2020-01-14 ENCOUNTER — Ambulatory Visit (INDEPENDENT_AMBULATORY_CARE_PROVIDER_SITE_OTHER): Payer: BC Managed Care – PPO | Admitting: Nurse Practitioner

## 2020-01-14 ENCOUNTER — Encounter: Payer: Self-pay | Admitting: Nurse Practitioner

## 2020-01-14 VITALS — BP 121/61 | HR 99 | Temp 98.5°F | Resp 16 | Ht 62.0 in | Wt 225.0 lb

## 2020-01-14 DIAGNOSIS — F3289 Other specified depressive episodes: Secondary | ICD-10-CM

## 2020-01-14 DIAGNOSIS — I10 Essential (primary) hypertension: Secondary | ICD-10-CM | POA: Diagnosis not present

## 2020-01-14 DIAGNOSIS — Z0000 Encounter for general adult medical examination without abnormal findings: Secondary | ICD-10-CM | POA: Diagnosis not present

## 2020-01-14 DIAGNOSIS — R7303 Prediabetes: Secondary | ICD-10-CM | POA: Diagnosis not present

## 2020-01-14 DIAGNOSIS — F419 Anxiety disorder, unspecified: Secondary | ICD-10-CM | POA: Diagnosis not present

## 2020-01-14 NOTE — Progress Notes (Signed)
Established Patient Office Visit  Subjective:  Patient ID: Doris Green, female    DOB: August 14, 1956  Age: 64 y.o. MRN: [id number]  CC:  Chief Complaint  Patient presents with  . Hypertension  . Edema    swelling in feet     HPI Doris Green presents for follow-up.  She  has a past medical history of Allergy, Arthritis, Depression, History of blood transfusion, Spider bite, and UTERINE FIBROID (12/13/2006).    Hypertension Patient is here for follow-up of elevated blood pressure. She is not exercising and is adherent to a low-salt diet. Blood pressure is well controlled at home. Cardiac symptoms: lower extremity edema. Patient denies chest pain, dyspnea, irregular heart beat, palpitations and syncope. Cardiovascular risk factors: hypertension, obesity (BMI >= 30 kg/m2) and sedentary lifestyle. Use of agents associated with hypertension: NSAIDS and steroids. History of target organ damage: none.  She uses the hydrochlorothiazide 12.5 mg every other day.  Edema Patient complains of edema in both ankles and feet. The edema has been moderate. Onset of symptoms was several weeks ago, and patient reports symptoms have comes and goes since that time. The edema is present intermittently and with being worse at night. The patient states the problem is long-standing. 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 The swelling has been relieved by diuretics. Associated factors include: nothing. Cardiac risk factors include hypertension, obesity (BMI >= 30 kg/m2) and sedentary lifestyle.   She has a history of depression. Current symptoms include depressed mood. Symptoms have been gradually worsening since that time. Patient denies difficulty concentrating, fatigue and insomnia. Previous treatment includes: medication Zoloft and Wellbutrin . She complains of the following side effects from the treatment: facial flushing and just not feeling like herself..  Her depression and  anxiety is brought on by family stressors.  She worries about her daughter.  She admits that she does not like to take medicine.   Past Medical History:  Diagnosis Date  . Allergy   . Arthritis   . Depression   . History of blood transfusion   . Spider bite    2016 and 2017  . UTERINE FIBROID 12/13/2006    Past Surgical History:  Procedure Laterality Date  . SHOULDER SURGERY    . TUBAL LIGATION      Family History  Problem Relation Age of Onset  . Diabetes Mother   . Hypertension Mother   . Heart disease Mother   . Cancer Father   . Diabetes Sister   . Hypertension Sister   . Heart disease Sister   . Hypertension Brother   . Diabetes Brother   . Cancer Maternal Grandmother   . Colon cancer Neg Hx   . Colon polyps Neg Hx   . Esophageal cancer Neg Hx   . Stomach cancer Neg Hx   . Rectal cancer Neg Hx   . Breast cancer Neg Hx     Social History   Socioeconomic History  . Marital status: Single    Spouse name: Not on file  . Number of children: 3  . Years of education: GED  . Highest education level: Not on file  Occupational History  . Occupation: Custodian    Employer: [employer]  Tobacco Use  . Smoking status: Current Some Day Smoker    Types: Cigarettes    Last attempt to quit: 10/19/2011    Years since quitting: 8.2  . Smokeless tobacco: Never Used  Substance and Sexual Activity  .  Alcohol use: No  . Drug use: No  . Sexual activity: Not on file  Other Topics Concern  . Not on file  Social History Narrative  . Not on file   Social Determinants of Health   Financial Resource Strain:   . Difficulty of Paying Living Expenses:   Food Insecurity:   . Worried About Charity fundraiser in the Last Year:   . Arboriculturist in the Last Year:   Transportation Needs:   . Film/video editor (Medical):   Marland Kitchen Lack of Transportation (Non-Medical):   Physical Activity:   . Days of Exercise per Week:   . Minutes of Exercise per Session:   Stress:   .  Feeling of Stress :   Social Connections:   . Frequency of Communication with Friends and Family:   . Frequency of Social Gatherings with Friends and Family:   . Attends Religious Services:   . Active Member of Clubs or Organizations:   . Attends Archivist Meetings:   Marland Kitchen Marital Status:   Intimate Partner Violence:   . Fear of Current or Ex-Partner:   . Emotionally Abused:   Marland Kitchen Physically Abused:   . Sexually Abused:     Outpatient Medications Prior to Visit  Medication Sig Dispense Refill  . Ascorbic Acid (VITAMIN C) 1000 MG tablet Take 1,000 mg by mouth daily.    . cholecalciferol (VITAMIN D3) 25 MCG (1000 UT) tablet Take 1,000 Units by mouth daily.    . hydrochlorothiazide (HYDRODIURIL) 12.5 MG tablet TAKE 1 TABLET(12.5 MG) BY MOUTH DAILY 90 tablet 1  . loratadine (CLARITIN) 10 MG tablet Take 1 tablet (10 mg total) by mouth daily. 30 tablet 11  . meloxicam (MOBIC) 7.5 MG tablet Take 1 tablet (7.5 mg total) by mouth daily. 30 tablet 1  . Multiple Vitamins-Minerals (MULTIVITAL PO) Take by mouth.    . Multiple Vitamins-Minerals (WOMENS HAIR, SKIN & NAILS PO) Take by mouth.    . Omega-3 1000 MG CAPS Take by mouth.    . Probiotic Product (PROBIOTIC ADVANCED PO) Take by mouth as needed.    . fluticasone (FLONASE) 50 MCG/ACT nasal spray Place 2 sprays into both nostrils daily. (Patient not taking: Reported on 01/14/2020) 16 g 6   No facility-administered medications prior to visit.    Allergies  Allergen Reactions  . Ibuprofen     Blood in stool    ROS Review of Systems  Constitutional: Negative.   HENT: Negative.   Eyes:       She has floater on the right eye. She 3 times she is going to make an apt   Respiratory: Negative.   Cardiovascular: Negative.   Gastrointestinal: Negative.   Endocrine: Negative.   Genitourinary: Negative.   Musculoskeletal: Negative.   Skin: Negative.   Allergic/Immunologic: Negative.   Neurological: Negative.   Hematological:  Negative.   Psychiatric/Behavioral: Positive for sleep disturbance.      Objective:    Physical Exam  Constitutional: She is oriented to person, place, and time. She appears well-developed.  Obese   HENT:  Head: Normocephalic.  Cardiovascular: Normal rate, regular rhythm, normal heart sounds and intact distal pulses.  Pulmonary/Chest: Effort normal and breath sounds normal.  Abdominal: Soft. Bowel sounds are normal.  Musculoskeletal:        General: Normal range of motion.     Cervical back: Normal range of motion.  Neurological: She is alert and oriented to person, place, and time.  Skin:  Skin is warm and dry.  Psychiatric: She has a normal mood and affect. Her behavior is normal. Judgment and thought content normal.    BP 121/61 (BP Location: Left Arm, Patient Position: Sitting, Cuff Size: Large)   Pulse 99   Temp 98.5 F (36.9 C) (Oral)   Resp 16   Ht 5\' 2"  (1.575 m)   Wt 225 lb (102.1 kg)   SpO2 99%   BMI 41.15 kg/m  Wt Readings from Last 3 Encounters:  01/14/20 225 lb (102.1 kg)  08/12/19 224 lb (101.6 kg)  04/10/19 220 lb (99.8 kg)     Health Maintenance Due  Topic Date Due  . PAP SMEAR-Modifier  05/17/2019    There are no preventive care reminders to display for this patient.  Lab Results  Component Value Date   TSH 1.960 10/30/2018   Lab Results  Component Value Date   WBC 8.3 10/30/2018   HGB 11.9 10/30/2018   HCT 37.0 10/30/2018   MCV 79.2 10/30/2018   PLT 291 08/07/2018   Lab Results  Component Value Date   NA 143 08/12/2019   K 4.1 08/12/2019   CO2 23 08/12/2019   GLUCOSE 87 08/12/2019   BUN 13 08/12/2019   CREATININE 0.94 08/12/2019   BILITOT 0.4 08/07/2018   ALKPHOS 82 08/07/2018   AST 17 08/07/2018   ALT 15 08/07/2018   PROT 6.8 08/07/2018   ALBUMIN 4.5 08/07/2018   CALCIUM 9.6 08/12/2019   Lab Results  Component Value Date   CHOL 198 08/07/2018   Lab Results  Component Value Date   HDL 78 08/07/2018   Lab Results   Component Value Date   LDLCALC 101 (H) 08/07/2018   Lab Results  Component Value Date   TRIG 94 08/07/2018   Lab Results  Component Value Date   CHOLHDL 2.5 08/07/2018   Lab Results  Component Value Date   HGBA1C 5.7 (A) 08/12/2019      Assessment & Plan:   Problem List Items Addressed This Visit      Unprioritized   Essential hypertension - Primary   Relevant Orders   CBC with Differential/Platelet   Comp. Metabolic Panel (12)   Prediabetes   Relevant Orders   Comp. Metabolic Panel (12)    Other Visit Diagnoses    Healthcare maintenance       Fasting labs pending   Relevant Orders   Lipid panel   TSH   Vitamin B12   Magnesium   VITAMIN D 25 Hydroxy (Vit-D Deficiency, Fractures)   POCT URINALYSIS DIP (CLINITEK)   Anxiety       Educational information provided Encouraged counseling   Other depression       Encourage patient to sleep with a counselor.  Declined at this time Genetic testing to evaluate which medications would be more effective if needed in the futu      No orders of the defined types were placed in this encounter.   Follow-up: Return in about 3 months (around 04/14/2020) for follow up, AND fasting labs in the morning.    Vevelyn Francois, NP

## 2020-01-14 NOTE — Patient Instructions (Addendum)
Managing Stress, Adult Feeling a certain amount of stress is normal. Stress helps our body and mind get ready to deal with the demands of life. Stress hormones can motivate you to do well at work and meet your responsibilities. However severe or long-lasting (chronic) stress can affect your mental and physical health. Chronic stress puts you at higher risk for anxiety, depression, and other health problems like digestive problems, muscle aches, heart disease, high blood pressure, and stroke. What are the causes? Common causes of stress include:  Demands from work, such as deadlines, feeling overworked, or having long hours.  Pressures at home, such as money issues, disagreements with a spouse, or parenting issues.  Pressures from major life changes, such as divorce, moving, loss of a loved one, or chronic illness. You may be at higher risk for stress-related problems if you do not get enough sleep, are in poor health, do not have emotional support, or have a mental health disorder like anxiety or depression. How to recognize stress Stress can make you:  Have trouble sleeping.  Feel sad, anxious, irritable, or overwhelmed.  Lose your appetite.  Overeat or want to eat unhealthy foods.  Want to use drugs or alcohol. Stress can also cause physical symptoms, such as:  Sore, tense muscles, especially in the shoulders and neck.  Headaches.  Trouble breathing.  A faster heart rate.  Stomach pain, nausea, or vomiting.  Diarrhea or constipation.  Trouble concentrating. Follow these instructions at home: Lifestyle  Identify the source of your stress and your reaction to it. See a therapist who can help you change your reactions.  When there are stressful events: ? Talk about it with family, friends, or co-workers. ? Try to think realistically about stressful events and not ignore them or overreact. ? Try to find the positives in a stressful situation and not focus on the  negatives. ? Cut back on responsibilities at work and home, if possible. Ask for help from friends or family members if you need it.  Find ways to cope with stress, such as: ? Meditation. ? Deep breathing. ? Yoga or tai chi. ? Progressive muscle relaxation. ? Doing art, playing music, or reading. ? Making time for fun activities. ? Spending time with family and friends.  Get support from family, friends, or spiritual resources. Eating and drinking  Eat a healthy diet. This includes: ? Eating foods that are high in fiber, such as beans, whole grains, and fresh fruits and vegetables. ? Limiting foods that are high in fat and processed sugars, such as fried and sweet foods.  Do not skip meals or overeat.  Drink enough fluid to keep your urine pale yellow. Alcohol use  Do not drink alcohol if: ? Your health care provider tells you not to drink. ? You are pregnant, may be pregnant, or are planning to become pregnant.  Drinking alcohol is a way some people try to ease their stress. This can be dangerous, so if you drink alcohol: ? Limit how much you use to:  0-1 drink a day for women.  0-2 drinks a day for men. ? Be aware of how much alcohol is in your drink. In the [place] one drink equals one 12 oz bottle of beer (355 mL), one 5 oz glass of wine (148 mL), or one 1 oz glass of hard liquor (44 mL). Activity   Include 30 minutes of exercise in your daily schedule. Exercise is a good stress reducer.  Include time in your day   for an activity that you find relaxing. Try taking a walk, going on a bike ride, reading a book, or listening to music.  Schedule your time in a way that lowers stress, and keep a consistent schedule. Prioritize what is most important to get done. General instructions  Get enough sleep. Try to go to sleep and get up at about the same time every day.  Take over-the-counter and prescription medicines only as told by your health care provider.  Do not use any  products that contain nicotine or tobacco, such as cigarettes, e-cigarettes, and chewing tobacco. If you need help quitting, ask your health care provider.  Do not use drugs or smoke to cope with stress.  Keep all follow-up visits as told by your health care provider. This is important. Where to find support  Talk with your health care provider about stress management or finding a support group.  Find a therapist to work with you on your stress management techniques. Contact a health care provider if:  Your stress symptoms get worse.  You are unable to manage your stress at home.  You are struggling to stop using drugs or alcohol. Get help right away if:  You may be a danger to yourself or others.  You have any thoughts of death or suicide. If you ever feel like you may hurt yourself or others, or have thoughts about taking your own life, get help right away. You can go to your nearest emergency department or call:  Your local emergency services (911 in the [place]  A suicide crisis helpline, such as the [facility] at 980-103-6109. This is open 24 hours a day. Summary  Feeling a certain amount of stress is normal, but severe or long-lasting (chronic) stress can affect your mental and physical health.  Chronic stress can put you at higher risk for anxiety, depression, and other health problems like digestive problems, muscle aches, heart disease, high blood pressure, and stroke.  You may be at higher risk for stress-related problems if you do not get enough sleep, are in poor health, lack emotional support, or have a mental health disorder like anxiety or depression.  Identify the source of your stress and your reaction to it. Try talking about stressful events with family, friends, or co-workers, finding a coping method, or getting support from spiritual resources.  If you need more help, talk with your health care provider about finding a support group  or a mental health therapist. This information is not intended to replace advice given to you by your health care provider. Make sure you discuss any questions you have with your health care provider. Document Revised: 04/30/2019 Document Reviewed: 04/30/2019 Elsevier Patient Education  [place].  Edema  Edema is when you have too much fluid in your body or under your skin. Edema may make your legs, feet, and ankles swell up. Swelling is also common in looser tissues, like around your eyes. This is a common condition. It gets more common as you get older. There are many possible causes of edema. Eating too much salt (sodium) and being on your feet or sitting for a long time can cause edema in your legs, feet, and ankles. Hot weather may make edema worse. Edema is usually painless. Your skin may look swollen or shiny. Follow these instructions at home:  Keep the swollen body part raised (elevated) above the level of your heart when you are sitting or lying down.  Do not sit  still or stand for a long time.  Do not wear tight clothes. Do not wear garters on your upper legs.  Exercise your legs. This can help the swelling go down.  Wear elastic bandages or support stockings as told by your doctor.  Eat a low-salt (low-sodium) diet to reduce fluid as told by your doctor.  Depending on the cause of your swelling, you may need to limit how much fluid you drink (fluid restriction).  Take over-the-counter and prescription medicines only as told by your doctor. Contact a doctor if:  Treatment is not working.  You have heart, liver, or kidney disease and have symptoms of edema.  You have sudden and unexplained weight gain. Get help right away if:  You have shortness of breath or chest pain.  You cannot breathe when you lie down.  You have pain, redness, or warmth in the swollen areas.  You have heart, liver, or kidney disease and get edema all of a sudden.  You have a fever  and your symptoms get worse all of a sudden. Summary  Edema is when you have too much fluid in your body or under your skin.  Edema may make your legs, feet, and ankles swell up. Swelling is also common in looser tissues, like around your eyes.  Raise (elevate) the swollen body part above the level of your heart when you are sitting or lying down.  Follow your doctor's instructions about diet and how much fluid you can drink (fluid restriction). This information is not intended to replace advice given to you by your health care provider. Make sure you discuss any questions you have with your health care provider. Document Revised: 10/05/2017 Document Reviewed: 10/20/2016 Elsevier Patient Education  [place], Adult After being diagnosed with an anxiety disorder, you may be relieved to know why you have felt or behaved a certain way. You may also feel overwhelmed about the treatment ahead and what it will mean for your life. With care and support, you can manage this condition and recover from it. How to manage lifestyle changes Managing stress and anxiety  Stress is your body's reaction to life changes and events, both good and bad. Most stress will last just a few hours, but stress can be ongoing and can lead to more than just stress. Although stress can play a major role in anxiety, it is not the same as anxiety. Stress is usually caused by something external, such as a deadline, test, or competition. Stress normally passes after the triggering event has ended.  Anxiety is caused by something internal, such as imagining a terrible outcome or worrying that something will go wrong that will devastate you. Anxiety often does not go away even after the triggering event is over, and it can become long-term (chronic) worry. It is important to understand the differences between stress and anxiety and to manage your stress effectively so that it does not lead to an  anxious response. Talk with your health care provider or a counselor to learn more about reducing anxiety and stress. He or she may suggest tension reduction techniques, such as:  Music therapy. This can include creating or listening to music that you enjoy and that inspires you.  Mindfulness-based meditation. This involves being aware of your normal breaths while not trying to control your breathing. It can be done while sitting or walking.  Centering prayer. This involves focusing on a word, phrase, or sacred image that means  something to you and brings you peace.  Deep breathing. To do this, expand your stomach and inhale slowly through your nose. Hold your breath for 3-5 seconds. Then exhale slowly, letting your stomach muscles relax.  Self-talk. This involves identifying thought patterns that lead to anxiety reactions and changing those patterns.  Muscle relaxation. This involves tensing muscles and then relaxing them. Choose a tension reduction technique that suits your lifestyle and personality. These techniques take time and practice. Set aside 5-15 minutes a day to do them. Therapists can offer counseling and training in these techniques. The training to help with anxiety may be covered by some insurance plans. Other things you can do to manage stress and anxiety include:  Keeping a stress/anxiety diary. This can help you learn what triggers your reaction and then learn ways to manage your response.  Thinking about how you react to certain situations. You may not be able to control everything, but you can control your response.  Making time for activities that help you relax and not feeling guilty about spending your time in this way.  Visual imagery and yoga can help you stay calm and relax.  Medicines Medicines can help ease symptoms. Medicines for anxiety include:  Anti-anxiety drugs.  Antidepressants. Medicines are often used as a primary treatment for anxiety disorder.  Medicines will be prescribed by a health care provider. When used together, medicines, psychotherapy, and tension reduction techniques may be the most effective treatment. Relationships Relationships can play a big part in helping you recover. Try to spend more time connecting with trusted friends and family members. Consider going to couples counseling, taking family education classes, or going to family therapy. Therapy can help you and others better understand your condition. How to recognize changes in your anxiety Everyone responds differently to treatment for anxiety. Recovery from anxiety happens when symptoms decrease and stop interfering with your daily activities at home or work. This may mean that you will start to:  Have better concentration and focus. Worry will interfere less in your daily thinking.  Sleep better.  Be less irritable.  Have more energy.  Have improved memory. It is important to recognize when your condition is getting worse. Contact your health care provider if your symptoms interfere with home or work and you feel like your condition is not improving. Follow these instructions at home: Activity  Exercise. Most adults should do the following: ? Exercise for at least 150 minutes each week. The exercise should increase your heart rate and make you sweat (moderate-intensity exercise). ? Strengthening exercises at least twice a week.  Get the right amount and quality of sleep. Most adults need 7-9 hours of sleep each night. Lifestyle   Eat a healthy diet that includes plenty of vegetables, fruits, whole grains, low-fat dairy products, and lean protein. Do not eat a lot of foods that are high in solid fats, added sugars, or salt.  Make choices that simplify your life.  Do not use any products that contain nicotine or tobacco, such as cigarettes, e-cigarettes, and chewing tobacco. If you need help quitting, ask your health care provider.  Avoid caffeine,  alcohol, and certain over-the-counter cold medicines. These may make you feel worse. Ask your pharmacist which medicines to avoid. General instructions  Take over-the-counter and prescription medicines only as told by your health care provider.  Keep all follow-up visits as told by your health care provider. This is important. Where to find support You can get help and support from  these sources:  Self-help groups.  Online and [employer].  A trusted spiritual leader.  Couples counseling.  Family education classes.  Family therapy. Where to find more information You may find that joining a support group helps you deal with your anxiety. The following sources can help you locate counselors or support groups near you:  [place]: www.mentalhealth[place].net  Anxiety and Depression Association of [place] (ADAA): https://www.[place].net/  National Alliance on Mental Illness (NAMI): www.nami.org Contact a health care provider if you:  Have a hard time staying focused or finishing daily tasks.  Spend many hours a day feeling worried about everyday life.  Become exhausted by worry.  Start to have headaches, feel tense, or have nausea.  Urinate more than normal.  Have diarrhea. Get help right away if you have:  A racing heart and shortness of breath.  Thoughts of hurting yourself or others. If you ever feel like you may hurt yourself or others, or have thoughts about taking your own life, get help right away. You can go to your nearest emergency department or call:  Your local emergency services (911 in the [place]  A suicide crisis helpline, such as the [facility] at 206-709-9911. This is open 24 hours a day. Summary  Taking steps to learn and use tension reduction techniques can help calm you and help prevent triggering an anxiety reaction.  When used together, medicines, psychotherapy, and tension reduction techniques may be the  most effective treatment.  Family, friends, and partners can play a big part in helping you recover from an anxiety disorder. This information is not intended to replace advice given to you by your health care provider. Make sure you discuss any questions you have with your health care provider. Document Revised: 03/04/2019 Document Reviewed: 03/04/2019 Elsevier Patient Education  [place]

## 2020-01-15 ENCOUNTER — Other Ambulatory Visit: Payer: Self-pay | Admitting: Nurse Practitioner

## 2020-01-15 ENCOUNTER — Other Ambulatory Visit (INDEPENDENT_AMBULATORY_CARE_PROVIDER_SITE_OTHER): Payer: BC Managed Care – PPO

## 2020-01-15 DIAGNOSIS — Z0000 Encounter for general adult medical examination without abnormal findings: Secondary | ICD-10-CM

## 2020-01-15 DIAGNOSIS — R82998 Other abnormal findings in urine: Secondary | ICD-10-CM

## 2020-01-15 LAB — POCT URINALYSIS DIPSTICK
Bilirubin, UA: NEGATIVE
Glucose, UA: NEGATIVE
Ketones, UA: NEGATIVE
Nitrite, UA: NEGATIVE
Protein, UA: NEGATIVE
Spec Grav, UA: 1.015 (ref 1.010–1.025)
Urobilinogen, UA: 0.2 E.U./dL
pH, UA: 7 (ref 5.0–8.0)

## 2020-01-16 LAB — COMP. METABOLIC PANEL (12)
AST: 17 IU/L (ref 0–40)
Albumin/Globulin Ratio: 1.9 (ref 1.2–2.2)
Albumin: 4.4 g/dL (ref 3.8–4.8)
Alkaline Phosphatase: 86 IU/L (ref 39–117)
BUN/Creatinine Ratio: 14 (ref 12–28)
BUN: 12 mg/dL (ref 8–27)
Bilirubin Total: 0.2 mg/dL (ref 0.0–1.2)
Calcium: 9.7 mg/dL (ref 8.7–10.3)
Chloride: 104 mmol/L (ref 96–106)
Creatinine, Ser: 0.87 mg/dL (ref 0.57–1.00)
GFR calc Af Amer: 81 mL/min/{1.73_m2} (ref >59)
GFR calc non Af Amer: 71 mL/min/{1.73_m2} (ref >59)
Globulin, Total: 2.3 g/dL (ref 1.5–4.5)
Glucose: 104 mg/dL — ABNORMAL HIGH (ref 65–99)
Potassium: 4.5 mmol/L (ref 3.5–5.2)
Sodium: 140 mmol/L (ref 134–144)
Total Protein: 6.7 g/dL (ref 6.0–8.5)

## 2020-01-16 LAB — LIPID PANEL
Chol/HDL Ratio: 2.5 ratio (ref 0.0–4.4)
Cholesterol, Total: 191 mg/dL (ref 100–199)
HDL: 75 mg/dL (ref >39)
LDL Chol Calc (NIH): 96 mg/dL (ref 0–99)
Triglycerides: 115 mg/dL (ref 0–149)
VLDL Cholesterol Cal: 20 mg/dL (ref 5–40)

## 2020-01-16 LAB — CBC WITH DIFFERENTIAL/PLATELET
Basophils Absolute: 0.1 10*3/uL (ref 0.0–0.2)
Basos: 1 %
EOS (ABSOLUTE): 0.2 10*3/uL (ref 0.0–0.4)
Eos: 3 %
Hematocrit: 39.4 % (ref 34.0–46.6)
Hemoglobin: 12.9 g/dL (ref 11.1–15.9)
Immature Grans (Abs): 0 10*3/uL (ref 0.0–0.1)
Immature Granulocytes: 0 %
Lymphocytes Absolute: 3 10*3/uL (ref 0.7–3.1)
Lymphs: 36 %
MCH: 26.3 pg — ABNORMAL LOW (ref 26.6–33.0)
MCHC: 32.7 g/dL (ref 31.5–35.7)
MCV: 80 fL (ref 79–97)
Monocytes Absolute: 0.5 10*3/uL (ref 0.1–0.9)
Monocytes: 5 %
Neutrophils Absolute: 4.6 10*3/uL (ref 1.4–7.0)
Neutrophils: 55 %
Platelets: 283 10*3/uL (ref 150–450)
RBC: 4.9 x10E6/uL (ref 3.77–5.28)
RDW: 14.1 % (ref 11.7–15.4)
WBC: 8.4 10*3/uL (ref 3.4–10.8)

## 2020-01-16 LAB — VITAMIN D 25 HYDROXY (VIT D DEFICIENCY, FRACTURES): Vit D, 25-Hydroxy: 25.1 ng/mL — ABNORMAL LOW (ref 30.0–100.0)

## 2020-01-16 LAB — VITAMIN B12: Vitamin B-12: 1336 pg/mL — ABNORMAL HIGH (ref 232–1245)

## 2020-01-16 LAB — TSH: TSH: 1.93 u[IU]/mL (ref 0.450–4.500)

## 2020-01-16 LAB — MAGNESIUM: Magnesium: 2.1 mg/dL (ref 1.6–2.3)

## 2020-01-17 LAB — URINE CULTURE

## 2020-01-19 ENCOUNTER — Telehealth: Payer: Self-pay

## 2020-01-19 NOTE — Telephone Encounter (Signed)
Called and spoke with patient, advised that labs are stable and that vitamin D was insufficient. Asked that she increase vitamin D from 1000 units to 2000 units daily and we can recheck in 1 year. Advised that vitamin B12 levels were slightly elevated and to reduce to take no more than 1000 mcg daily. Patient verbalized understanding. Thanks!

## 2020-01-19 NOTE — Telephone Encounter (Signed)
-----   Message from Vevelyn Francois, NP sent at 01/19/2020  9:42 AM EDT ----- Please make Ms. Faulstich aware that overall her labs are stable.  She does have vitamin D insufficiency.  Encourage her to increase her daily vitamin D from 1000 units to 2000 units daily and we will reevaluate in 1 year.  Her vitamin B12 level is slightly elevated.  Please verify how much vitamin B12 she is taking the recommendation is 1000 mcg daily.  It looks like she is on several vitamins.  Encouraged her not to exceed the normal daily dose

## 2020-02-24 ENCOUNTER — Other Ambulatory Visit: Payer: Self-pay

## 2020-02-24 DIAGNOSIS — I10 Essential (primary) hypertension: Secondary | ICD-10-CM

## 2020-02-24 MED ORDER — HYDROCHLOROTHIAZIDE 12.5 MG PO TABS: ORAL_TABLET | ORAL | 1 refills | Status: DC

## 2020-04-02 ENCOUNTER — Ambulatory Visit (INDEPENDENT_AMBULATORY_CARE_PROVIDER_SITE_OTHER): Payer: BC Managed Care – PPO | Admitting: Nurse Practitioner

## 2020-04-02 ENCOUNTER — Other Ambulatory Visit: Payer: Self-pay

## 2020-04-02 ENCOUNTER — Encounter: Payer: Self-pay | Admitting: Nurse Practitioner

## 2020-04-02 VITALS — BP 135/82 | HR 75 | Temp 97.4°F | Ht 62.0 in | Wt 224.6 lb

## 2020-04-02 DIAGNOSIS — R0981 Nasal congestion: Secondary | ICD-10-CM | POA: Diagnosis not present

## 2020-04-02 DIAGNOSIS — H6692 Otitis media, unspecified, left ear: Secondary | ICD-10-CM

## 2020-04-02 MED ORDER — AZITHROMYCIN 250 MG PO TABS: ORAL_TABLET | ORAL | 0 refills | Status: DC

## 2020-04-02 MED ORDER — CORICIDIN HBP NIGHTTIME COLD 15-6.25-325 MG/15ML PO LIQD: 15.0000 mL | Freq: Two times a day (BID) | ORAL | 0 refills | Status: AC

## 2020-04-02 NOTE — Progress Notes (Signed)
[place], [place]  [address] Phone:  [phone]   Fax:  [phone]   Established Patient Office Visit  Subjective:  Patient ID: Doris Green, female    DOB: 06/09/1956  Age: 64 y.o. MRN: [id number]  CC:  Chief Complaint  Patient presents with   Otalgia    ear pain in left pain, feels full, more pain at night , feels sinus drainage in her ear     HPI Nichole Neyer presents for left ear pain. She  has a past medical history of Allergy, Arthritis, Depression, History of blood transfusion, Spider bite, and UTERINE FIBROID (12/13/2006).   Ear Pain Patient complains of ear pain and possible ear infection. Symptoms include left ear drainage . Onset of symptoms was several days ago, and have been gradually worsening since that time. Associated symptoms include: congestion, headache, post nasal drip and sinus pressure. Patient denies: chills, low grade fever, non productive cough, productive cough and sore throat. She is drinking plenty of fluids. She is having occasional headache, dizziness, eye irritation and drainage with ear pressures. She feels "spacey and loopy. " she has seasonal allergies and is taking Claritin with no real relief.    Past Medical History:  Diagnosis Date   Allergy    Arthritis    Depression    History of blood transfusion    Spider bite    2016 and 2017   UTERINE FIBROID 12/13/2006    Past Surgical History:  Procedure Laterality Date   SHOULDER SURGERY     TUBAL LIGATION      Family History  Problem Relation Age of Onset   Diabetes Mother    Hypertension Mother    Heart disease Mother    Cancer Father    Diabetes Sister    Hypertension Sister    Heart disease Sister    Hypertension Brother    Diabetes Brother    Cancer Maternal Grandmother    Colon cancer Neg Hx    Colon polyps Neg Hx    Esophageal cancer Neg Hx    Stomach cancer Neg Hx    Rectal cancer Neg Hx     Breast cancer Neg Hx     Social History   Socioeconomic History   Marital status: Single    Spouse name: Not on file   Number of children: 3   Years of education: GED   Highest education level: Not on file  Occupational History   Occupation: Custodian    Employer: [employer]  Tobacco Use   Smoking status: Current Some Day Smoker    Types: Cigarettes    Last attempt to quit: 10/19/2011    Years since quitting: 8.4   Smokeless tobacco: Never Used  Vaping Use   Vaping Use: Never used  Substance and Sexual Activity   Alcohol use: No   Drug use: No   Sexual activity: Not on file  Other Topics Concern   Not on file  Social History Narrative   Not on file   Social Determinants of Health   Financial Resource Strain:    Difficulty of Paying Living Expenses:   Food Insecurity:    Worried About Charity fundraiser in the Last Year:    Arboriculturist in the Last Year:   Transportation Needs:    Film/video editor (Medical):    Lack of Transportation (Non-Medical):   Physical Activity:    Days of Exercise  per Week:    Minutes of Exercise per Session:   Stress:    Feeling of Stress :   Social Connections:    Frequency of Communication with Friends and Family:    Frequency of Social Gatherings with Friends and Family:    Attends Religious Services:    Active Member of Clubs or Organizations:    Attends Music therapist:    Marital Status:   Intimate Partner Violence:    Fear of Current or Ex-Partner:    Emotionally Abused:    Physically Abused:    Sexually Abused:     Outpatient Medications Prior to Visit  Medication Sig Dispense Refill   Ascorbic Acid (VITAMIN C) 1000 MG tablet Take 1,000 mg by mouth daily.     cholecalciferol (VITAMIN D3) 25 MCG (1000 UT) tablet Take 1,000 Units by mouth daily.     hydrochlorothiazide (HYDRODIURIL) 12.5 MG tablet TAKE 1 TABLET(12.5 MG) BY MOUTH DAILY 90 tablet 1    loratadine (CLARITIN) 10 MG tablet Take 1 tablet (10 mg total) by mouth daily. 30 tablet 11   meloxicam (MOBIC) 7.5 MG tablet Take 1 tablet (7.5 mg total) by mouth daily. 30 tablet 1   Multiple Vitamins-Minerals (WOMENS HAIR, SKIN & NAILS PO) Take by mouth.     Omega-3 1000 MG CAPS Take by mouth.     Probiotic Product (PROBIOTIC ADVANCED PO) Take by mouth as needed.     Multiple Vitamins-Minerals (MULTIVITAL PO) Take by mouth.     fluticasone (FLONASE) 50 MCG/ACT nasal spray Place 2 sprays into both nostrils daily. (Patient not taking: Reported on 01/14/2020) 16 g 6   No facility-administered medications prior to visit.    Allergies  Allergen Reactions   Ibuprofen     Blood in stool    ROS Review of Systems  All other systems reviewed and are negative.     Objective:    Physical Exam Constitutional:      Appearance: She is obese.  HENT:     Head: Normocephalic and atraumatic.     Right Ear: Tympanic membrane, ear canal and external ear normal.     Left Ear: Tympanic membrane, ear canal and external ear normal.     Mouth/Throat:     Mouth: Mucous membranes are moist.     Pharynx: Oropharynx is clear. No posterior oropharyngeal erythema.  Eyes:     Pupils: Pupils are equal, round, and reactive to light.  Cardiovascular:     Rate and Rhythm: Normal rate and regular rhythm.     Pulses: Normal pulses.     Heart sounds: Normal heart sounds.  Pulmonary:     Effort: Pulmonary effort is normal.     Breath sounds: Normal breath sounds.  Musculoskeletal:     Cervical back: Normal range of motion.  Neurological:     Mental Status: She is alert.     BP 135/82 (BP Location: Left Arm, Patient Position: Sitting, Cuff Size: Large)    Pulse 75    Temp (!) 97.4 F (36.3 C) (Temporal)    Ht 5\' 2"  (1.575 m)    Wt 224 lb 9.6 oz (101.9 kg)    SpO2 100%    BMI 41.08 kg/m  Wt Readings from Last 3 Encounters:  04/02/20 224 lb 9.6 oz (101.9 kg)  01/14/20 225 lb (102.1 kg)    08/12/19 224 lb (101.6 kg)     Health Maintenance Due  Topic Date Due   COVID-19 Vaccine (1) Never done  PAP SMEAR-Modifier  05/17/2019    There are no preventive care reminders to display for this patient.  Lab Results  Component Value Date   TSH 1.930 01/15/2020   Lab Results  Component Value Date   WBC 8.4 01/15/2020   HGB 12.9 01/15/2020   HCT 39.4 01/15/2020   MCV 80 01/15/2020   PLT 283 01/15/2020   Lab Results  Component Value Date   NA 140 01/15/2020   K 4.5 01/15/2020   CO2 23 08/12/2019   GLUCOSE 104 (H) 01/15/2020   BUN 12 01/15/2020   CREATININE 0.87 01/15/2020   BILITOT 0.2 01/15/2020   ALKPHOS 86 01/15/2020   AST 17 01/15/2020   ALT 15 08/07/2018   PROT 6.7 01/15/2020   ALBUMIN 4.4 01/15/2020   CALCIUM 9.7 01/15/2020   Lab Results  Component Value Date   CHOL 191 01/15/2020   Lab Results  Component Value Date   HDL 75 01/15/2020   Lab Results  Component Value Date   LDLCALC 96 01/15/2020   Lab Results  Component Value Date   TRIG 115 01/15/2020   Lab Results  Component Value Date   CHOLHDL 2.5 01/15/2020   Lab Results  Component Value Date   HGBA1C 5.7 (A) 08/12/2019      Assessment & Plan:   Problem List Items Addressed This Visit    None    Visit Diagnoses    Otitis of left ear    -  Primary   Sinus congestion       Relevant Medications   DM-Doxylamine-Acetaminophen (CORICIDIN HBP NIGHTTIME COLD) 15-6.25-325 MG/15ML LIQD   azithromycin (ZITHROMAX) 250 MG tablet      Meds ordered this encounter  Medications   DM-Doxylamine-Acetaminophen (CORICIDIN HBP NIGHTTIME COLD) 15-6.25-325 MG/15ML LIQD    Sig: Take 15 mLs by mouth 2 (two) times daily for 10 days.    Dispense:  240 mL    Refill:  0    Order Specific Question:   Supervising Provider    Answer:   Tresa Garter [[id number]]   azithromycin (ZITHROMAX) 250 MG tablet    Sig: 2 tabs x1 then daily x4    Dispense:  6 tablet    Refill:  0    Order  Specific Question:   Supervising Provider    Answer:   Tresa Garter [[id number]]    Follow-up: Return for please make annual exam for physical thanks.    Vevelyn Francois, NP

## 2020-04-02 NOTE — Patient Instructions (Signed)

## 2020-04-21 ENCOUNTER — Encounter: Payer: Self-pay | Admitting: Nurse Practitioner

## 2020-04-21 ENCOUNTER — Ambulatory Visit (INDEPENDENT_AMBULATORY_CARE_PROVIDER_SITE_OTHER): Payer: BC Managed Care – PPO | Admitting: Nurse Practitioner

## 2020-04-21 ENCOUNTER — Other Ambulatory Visit: Payer: Self-pay

## 2020-04-21 VITALS — BP 153/86 | HR 66 | Temp 97.5°F | Ht 62.0 in | Wt 224.0 lb

## 2020-04-21 DIAGNOSIS — J3089 Other allergic rhinitis: Secondary | ICD-10-CM

## 2020-04-21 DIAGNOSIS — I10 Essential (primary) hypertension: Secondary | ICD-10-CM

## 2020-04-21 DIAGNOSIS — R82998 Other abnormal findings in urine: Secondary | ICD-10-CM | POA: Diagnosis not present

## 2020-04-21 DIAGNOSIS — F419 Anxiety disorder, unspecified: Secondary | ICD-10-CM | POA: Diagnosis not present

## 2020-04-21 LAB — POCT URINALYSIS DIPSTICK
Bilirubin, UA: NEGATIVE
Glucose, UA: NEGATIVE
Ketones, UA: NEGATIVE
Nitrite, UA: NEGATIVE
Protein, UA: NEGATIVE
Spec Grav, UA: 1.015 (ref 1.010–1.025)
Urobilinogen, UA: 0.2 E.U./dL
pH, UA: 5.5 (ref 5.0–8.0)

## 2020-04-21 MED ORDER — LORATADINE 10 MG PO TABS: 10.0000 mg | ORAL_TABLET | Freq: Every day | ORAL | 11 refills | Status: AC

## 2020-04-21 MED ORDER — DIAZEPAM 5 MG PO TABS: 5.0000 mg | ORAL_TABLET | Freq: Two times a day (BID) | ORAL | 0 refills | Status: AC | PRN

## 2020-04-21 NOTE — Progress Notes (Signed)
[place], [place]  [address] Phone:  [phone]   Fax:  [phone]    Established Patient Office Visit  Subjective:  Patient ID: Doris Green, female    DOB: 1956-05-08  Age: 64 y.o. MRN: [id number]  CC:  Chief Complaint  Patient presents with   Follow-up    routine follow up, Stressful situation with her daughter requesting something for her nerves.    Spasms    Low back.     HPI Erion Hermans presents for follow up. She  has a past medical history of Allergy, Arthritis, Depression, History of blood transfusion, Spider bite, and UTERINE FIBROID (12/13/2006).   Anxiety Patient is here for evaluation of anxiety.  She has the following anxiety symptoms: feelings of losing control, insomnia, irritable, Shoulder tightness and muscles spasms. Onset of symptoms was approximately several years ago.  Symptoms have been rapidly worsening since that time. She denies current suicidal and homicidal ideation. Possible organic causes contributing are: Situational. Risk factors: negative life event Current living conditions with daughter and grandchildren with repeated disrespect and previous episode of depression Previous treatment includes individual therapy and medication Valium in the past which was effective.  He denies any previous side effects of the Valium.  Past Medical History:  Diagnosis Date   Allergy    Arthritis    Depression    History of blood transfusion    Spider bite    2016 and 2017   UTERINE FIBROID 12/13/2006    Past Surgical History:  Procedure Laterality Date   SHOULDER SURGERY     TUBAL LIGATION      Family History  Problem Relation Age of Onset   Diabetes Mother    Hypertension Mother    Heart disease Mother    Cancer Father    Diabetes Sister    Hypertension Sister    Heart disease Sister    Hypertension Brother    Diabetes Brother    Cancer Maternal Grandmother     Colon cancer Neg Hx    Colon polyps Neg Hx    Esophageal cancer Neg Hx    Stomach cancer Neg Hx    Rectal cancer Neg Hx    Breast cancer Neg Hx     Social History   Socioeconomic History   Marital status: Single    Spouse name: Not on file   Number of children: 3   Years of education: GED   Highest education level: Not on file  Occupational History   Occupation: Custodian    Employer: [employer]  Tobacco Use   Smoking status: Current Some Day Smoker    Packs/day: 0.25    Types: Cigarettes    Last attempt to quit: 10/19/2011    Years since quitting: 8.5   Smokeless tobacco: Never Used   Tobacco comment: 2-3  cigs// per day  Vaping Use   Vaping Use: Never used  Substance and Sexual Activity   Alcohol use: No   Drug use: No   Sexual activity: Not on file  Other Topics Concern   Not on file  Social History Narrative   Not on file   Social Determinants of Health   Financial Resource Strain:    Difficulty of Paying Living Expenses:   Food Insecurity:    Worried About Estate manager/land agent of Food in the Last Year:    Ran Out of Food in the Last Year:   Transportation Needs:  Lack of Transportation (Medical):    Lack of Transportation (Non-Medical):   Physical Activity:    Days of Exercise per Week:    Minutes of Exercise per Session:   Stress:    Feeling of Stress :   Social Connections:    Frequency of Communication with Friends and Family:    Frequency of Social Gatherings with Friends and Family:    Attends Religious Services:    Active Member of Clubs or Organizations:    Attends Music therapist:    Marital Status:   Intimate Partner Violence:    Fear of Current or Ex-Partner:    Emotionally Abused:    Physically Abused:    Sexually Abused:     Outpatient Medications Prior to Visit  Medication Sig Dispense Refill   Ascorbic Acid (VITAMIN C) 1000 MG tablet Take 1,000 mg by mouth daily.      cholecalciferol (VITAMIN D3) 25 MCG (1000 UT) tablet Take 1,000 Units by mouth daily.     hydrochlorothiazide (HYDRODIURIL) 12.5 MG tablet TAKE 1 TABLET(12.5 MG) BY MOUTH DAILY 90 tablet 1   meloxicam (MOBIC) 7.5 MG tablet Take 1 tablet (7.5 mg total) by mouth daily. 30 tablet 1   Multiple Vitamins-Minerals (WOMENS HAIR, SKIN & NAILS PO) Take by mouth.     Omega-3 1000 MG CAPS Take by mouth.     Probiotic Product (PROBIOTIC ADVANCED PO) Take by mouth as needed.     loratadine (CLARITIN) 10 MG tablet Take 1 tablet (10 mg total) by mouth daily. 30 tablet 11   fluticasone (FLONASE) 50 MCG/ACT nasal spray Place 2 sprays into both nostrils daily. (Patient not taking: Reported on 01/14/2020) 16 g 6   azithromycin (ZITHROMAX) 250 MG tablet 2 tabs x1 then daily x4 6 tablet 0   No facility-administered medications prior to visit.    Allergies  Allergen Reactions   Ibuprofen     Blood in stool    ROS Review of Systems    Objective:    Physical Exam Constitutional:      General: She is not in acute distress.    Appearance: She is obese. She is not ill-appearing or toxic-appearing.  HENT:     Head: Normocephalic.     Nose: Nose normal.     Mouth/Throat:     Mouth: Mucous membranes are moist.     Pharynx: Oropharynx is clear.  Cardiovascular:     Rate and Rhythm: Normal rate and regular rhythm.     Pulses: Normal pulses.     Heart sounds: Normal heart sounds.  Pulmonary:     Effort: Pulmonary effort is normal.     Breath sounds: Normal breath sounds.  Abdominal:     General: Bowel sounds are normal.     Palpations: Abdomen is soft.  Musculoskeletal:        General: Tenderness present.     Cervical back: Normal range of motion.  Skin:    General: Skin is warm and dry.  Neurological:     Mental Status: She is alert and oriented to person, place, and time.  Psychiatric:     Comments: Anxious and tearful     BP (!) 153/86 (BP Location: Right Arm, Patient Position:  Sitting, Cuff Size: Large)    Pulse 66    Temp (!) 97.5 F (36.4 C)    Ht 5\' 2"  (1.575 m)    Wt 224 lb 0.4 oz (101.6 kg)    SpO2 100%    BMI  40.97 kg/m  Wt Readings from Last 3 Encounters:  04/23/20 225 lb 4 oz (102.2 kg)  04/21/20 224 lb 0.4 oz (101.6 kg)  04/02/20 224 lb 9.6 oz (101.9 kg)     Health Maintenance Due  Topic Date Due   COVID-19 Vaccine (1) Never done    There are no preventive care reminders to display for this patient.  Lab Results  Component Value Date   TSH 1.930 01/15/2020   Lab Results  Component Value Date   WBC 8.4 01/15/2020   HGB 12.9 01/15/2020   HCT 39.4 01/15/2020   MCV 80 01/15/2020   PLT 283 01/15/2020   Lab Results  Component Value Date   NA 140 01/15/2020   K 4.5 01/15/2020   CO2 23 08/12/2019   GLUCOSE 104 (H) 01/15/2020   BUN 12 01/15/2020   CREATININE 0.87 01/15/2020   BILITOT 0.2 01/15/2020   ALKPHOS 86 01/15/2020   AST 17 01/15/2020   ALT 15 08/07/2018   PROT 6.7 01/15/2020   ALBUMIN 4.4 01/15/2020   CALCIUM 9.7 01/15/2020   Lab Results  Component Value Date   CHOL 191 01/15/2020   Lab Results  Component Value Date   HDL 75 01/15/2020   Lab Results  Component Value Date   LDLCALC 96 01/15/2020   Lab Results  Component Value Date   TRIG 115 01/15/2020   Lab Results  Component Value Date   CHOLHDL 2.5 01/15/2020   Lab Results  Component Value Date   HGBA1C 5.7 (A) 08/12/2019      Assessment & Plan:   Problem List Items Addressed This Visit      Cardiovascular and Mediastinum   Essential hypertension - Primary   Relevant Orders   Urinalysis Dipstick (Completed) Continue with current regimen hydrochlorothiazide.  Encourage patient to control stress. Encouraged lifestyle modifications of dietary changes and exercise for weight loss.  Encourage patient to monitor blood pressure outside of office and notify if blood pressures are consistently greater than 140/90 Encourage smoking cessation      Respiratory   Allergic rhinitis   Relevant Medications   loratadine (CLARITIN) 10 MG tablet Continue with current regimen and try to avoid possible triggers    Other Visit Diagnoses    Anxiety       Relevant Medications   diazepam (VALIUM) 5 MG tablet   Other Relevant Orders   Neuropsychological assessment Encourage patient to use Valium sparingly.  This is a short-term treatment until patient's daughter has left the home in August.   Leukocytes in urine       Relevant Orders   Urine Culture (Completed)      Meds ordered this encounter  Medications   diazepam (VALIUM) 5 MG tablet    Sig: Take 1 tablet (5 mg total) by mouth every 12 (twelve) hours as needed for anxiety.    Dispense:  45 tablet    Refill:  0    Order Specific Question:   Supervising Provider    Answer:   Tresa Garter [[id number]]   loratadine (CLARITIN) 10 MG tablet    Sig: Take 1 tablet (10 mg total) by mouth daily.    Dispense:  30 tablet    Refill:  11    Order Specific Question:   Supervising Provider    Answer:   Tresa Garter [[id number]]    Follow-up: Return in about 4 weeks (around 05/19/2020) for FU anxiety.    Vevelyn Francois, NP

## 2020-04-21 NOTE — Patient Instructions (Signed)

## 2020-04-23 ENCOUNTER — Ambulatory Visit (INDEPENDENT_AMBULATORY_CARE_PROVIDER_SITE_OTHER): Payer: BC Managed Care – PPO | Admitting: Nurse Practitioner

## 2020-04-23 ENCOUNTER — Encounter: Payer: Self-pay | Admitting: Nurse Practitioner

## 2020-04-23 ENCOUNTER — Other Ambulatory Visit: Payer: Self-pay

## 2020-04-23 VITALS — BP 159/95 | HR 65 | Temp 97.0°F | Ht 62.0 in | Wt 225.2 lb

## 2020-04-23 DIAGNOSIS — Z124 Encounter for screening for malignant neoplasm of cervix: Secondary | ICD-10-CM | POA: Diagnosis not present

## 2020-04-23 DIAGNOSIS — Z1239 Encounter for other screening for malignant neoplasm of breast: Secondary | ICD-10-CM

## 2020-04-23 DIAGNOSIS — Z0000 Encounter for general adult medical examination without abnormal findings: Secondary | ICD-10-CM | POA: Diagnosis not present

## 2020-04-23 LAB — URINE CULTURE: Organism ID, Bacteria: NO GROWTH

## 2020-04-23 NOTE — Patient Instructions (Addendum)
Cancer Screening for Women A cancer screening is a test or exam that checks for cancer. Your health care provider will recommend specific cancer screenings based on your age, personal history, and family history of cancer. Work with your health care provider to create a cancer screening schedule that protects your health. Why is cancer screening done? Cancer screening is done to look for cancer in the very early stages, before it spreads and becomes harder to treat and before you would start to notice symptoms. Finding cancer early improves the chances of successful treatment. It may save your life. Who should be screened for cancer? All women should be screened for certain cancers, including breast cancer, cervical cancer, and skin cancer. Your health care provider may recommend screenings for other types of cancer if:  You had cancer before.  You have a family member with cancer.  You have abnormal genes that could increase the risk of cancer.  You have risk factors for certain cancers, such as smoking. When you should be screened for cancer depends on:  Your age.  Your medical history and your family's medical history.  Certain lifestyle factors, such as smoking.  Environmental exposure, such as to asbestos. What are some common cancer screenings? Breast cancer Breast cancer screening is done with a test that takes images of breast tissue (mammogram). Here are some screening guidelines:  When you are age 40-44, you will be given the choice to start having mammograms.  When you are age 45-54, you should have a mammogram every year.  You may start having mammograms before age 45 if you have risk factors for breast cancer, such as having an immediate family member with breast cancer.  When you are age 55 or older, you should have a mammogram every 1-2 years for as long as you are in good health and have a life expectancy of 10 years or more.  It is important to know what your  breasts look and feel like so you can report any changes to your health care provider.  Cervical cancer Cervical cancer screening is done with a Pap test. This testchecks for abnormalities, including the virus that causes cervical cancer (human papillomavirus, or HPV). To perform the test, a health care provider takes a swab of cervical cells during a pelvic exam. Screening for cervical cancer with a Pap test should start at age 21. Here are some screening guidelines:  When you are age 21-29, you should have a Pap test every 3 years.  When you are age 30-65, you should have a Pap test and HPV test every 5 years or have a Pap test every 3 years.  You may be screened for cervical cancer more often if you have risk factors for cervical cancer.  If your Pap tests are abnormal, you may have an HPV test.  If you have had the HPV vaccine, you will still be screened for cervical cancer and follow normal screening recommendations. You do not need to be screened for cervical cancer if any of the following apply to you:  You are older than age 65 and you have not had a serious cervical precancer or cancer in the last 20 years.  Your cervix and uterus have been removed and you have never had cervical cancer or precancerous cells. Endometrial cancer There is no standard screening test for endometrial cancer, but the cancer can be detected with:  A test of a sample of tissue taken from the lining of the uterus (endometrial tissue   biopsy).  A vaginal ultrasound.  Pap tests. If you are at increased risk for endometrial cancer, you may need to have these tests more often than normal. You are at increased risk if:  You have a family history of ovarian, uterine, or colon cancer.  You are taking tamoxifen, a drug that is used to treat breast cancer.  You have certain types of colon cancer. If you have reached menopause, it is especially important to talk with your health care provider about any vaginal  bleeding or spotting. Screening for endometrial cancer is not recommended for women who do not have symptoms of the cancer, such as vaginal bleeding. Colorectal cancer  All adults should have screening for colorectal cancer starting at age 50 and continuing until age 75. Your health care provider may recommend screening at age 45. You will have tests every 1-10 years, depending on your results and the type of screening test. If you have a family history of colon or rectal cancer or other risk factors, you may need to start having screenings earlier. Talk with your health care provider about which screening test is right for you and how often you should be screened. Colorectal cancer screening looks for cancer or for growths called polyps that often form before cancer starts. Tests to look for cancer or polyps include:  Colonoscopy or flexible sigmoidoscopy. For these procedures, a flexible tube with a small camera is inserted into the rectum.  CT colonography. This test uses X-rays and a contrast dye to check the colon for polyps. If a polyp is found, you may need to have a colonoscopy so the polyp can be located and removed. Tests to look for cancer in the stool (feces) include:  Guaiac-based fecal occult blood test (FOBT). This test detects blood in stool. It can be done at home with a kit.  Fecal immunochemical test (FIT). This test detects blood in stool. For this test, you will need to collect stool samples at home.  Stool DNA test. This test looks for blood in stool and any changes in DNA that can lead to colon cancer. For this test, you will need to collect a stool sample at home and send it to a lab.  Skin cancer Skin cancer screening is done by checking the skin for unusual moles or spots and any changes in existing moles. Your health care provider should check your skin for signs of skin cancer at every physical exam. You should check your skin every month and tell your health care  provider right away if anything looks unusual. Women with a higher-than-normal risk for skin cancer may want to see a skin specialist (dermatologist) for an annual body check. Lung cancer Lung cancer screening is done with a CT scan that looks for abnormal cells in the lungs. Discuss lung cancer screening with your health care provider if you are 55-74 years old and if any of the following apply to you:  You currently smoke.  You used to smoke heavily.  You have a smoking history of 1 pack a day for 30 years or 2 packs a day for 15 years.  You have quit smoking within the past 15 years. If you smoke heavily or if you used to smoke, you may need to be screened every year. Where to find more information  National Cancer Institute: https://www.cancer.gov/about-cancer/screening  Centers for Disease Control and Prevention: https://www.cdc.gov/cancer/dcpc/prevention/screening.htm  Department of Health and Human Services: https://www.womenshealth.gov/screening-tests-and-vaccines/screening-tests-for-women Contact a health care provider if:  You have   concerns about any signs or symptoms of cancer, such as: ? Moles that have an unusual shape or color. ? Changes in existing moles. ? A sore on your skin that does not heal. ? Blood in your stool. ? Fatigue that does not go away. ? Frequent pain or cramping in your abdomen. ? Coughing, or coughing up blood. ? Losing weight without trying. ? Lumps or other changes in your breasts. ? Vaginal bleeding, spotting, or changes in your periods. Summary  Be aware of and watch for signs and symptoms of cancer, especially symptoms of breast cancer, cervical cancer, endometrial cancer, colorectal cancer, skin cancer, and lung cancer.  Early detection of cancer with cancer screening may save your life.  Talk with your health care provider about your specific cancer risks.  Work together with your health care provider to create a cancer screening plan  that is right for you. This information is not intended to replace advice given to you by your health care provider. Make sure you discuss any questions you have with your health care provider. Document Revised: 01/22/2019 Document Reviewed: 06/29/2016 [employer] Patient Education  2020 [employer]  Health Maintenance, Female Adopting a healthy lifestyle and getting preventive care are important in promoting health and wellness. Ask your health care provider about:  The right schedule for you to have regular tests and exams.  Things you can do on your own to prevent diseases and keep yourself healthy. What should I know about diet, weight, and exercise? Eat a healthy diet   Eat a diet that includes plenty of vegetables, fruits, low-fat dairy products, and lean protein.  Do not eat a lot of foods that are high in solid fats, added sugars, or sodium. Maintain a healthy weight Body mass index (BMI) is used to identify weight problems. It estimates body fat based on height and weight. Your health care provider can help determine your BMI and help you achieve or maintain a healthy weight. Get regular exercise Get regular exercise. This is one of the most important things you can do for your health. Most adults should:  Exercise for at least 150 minutes each week. The exercise should increase your heart rate and make you sweat (moderate-intensity exercise).  Do strengthening exercises at least twice a week. This is in addition to the moderate-intensity exercise.  Spend less time sitting. Even light physical activity can be beneficial. Watch cholesterol and blood lipids Have your blood tested for lipids and cholesterol at 64 years of age, then have this test every 5 years. Have your cholesterol levels checked more often if:  Your lipid or cholesterol levels are high.  You are older than 64 years of age.  You are at high risk for heart disease. What should I know about cancer  screening? Depending on your health history and family history, you may need to have cancer screening at various ages. This may include screening for:  Breast cancer.  Cervical cancer.  Colorectal cancer.  Skin cancer.  Lung cancer. What should I know about heart disease, diabetes, and high blood pressure? Blood pressure and heart disease  High blood pressure causes heart disease and increases the risk of stroke. This is more likely to develop in people who have high blood pressure readings, are of African descent, or are overweight.  Have your blood pressure checked: ? Every 3-5 years if you are 18-39 years of age. ? Every year if you are 40 years old or older. Diabetes Have regular diabetes   screenings. This checks your fasting blood sugar level. Have the screening done:  Once every three years after age 40 if you are at a normal weight and have a low risk for diabetes.  More often and at a younger age if you are overweight or have a high risk for diabetes. What should I know about preventing infection? Hepatitis B If you have a higher risk for hepatitis B, you should be screened for this virus. Talk with your health care provider to find out if you are at risk for hepatitis B infection. Hepatitis C Testing is recommended for:  Everyone born from 1945 through 1965.  Anyone with known risk factors for hepatitis C. Sexually transmitted infections (STIs)  Get screened for STIs, including gonorrhea and chlamydia, if: ? You are sexually active and are younger than 64 years of age. ? You are older than 64 years of age and your health care provider tells you that you are at risk for this type of infection. ? Your sexual activity has changed since you were last screened, and you are at increased risk for chlamydia or gonorrhea. Ask your health care provider if you are at risk.  Ask your health care provider about whether you are at high risk for HIV. Your health care provider may  recommend a prescription medicine to help prevent HIV infection. If you choose to take medicine to prevent HIV, you should first get tested for HIV. You should then be tested every 3 months for as long as you are taking the medicine. Pregnancy  If you are about to stop having your period (premenopausal) and you may become pregnant, seek counseling before you get pregnant.  Take 400 to 800 micrograms (mcg) of folic acid every day if you become pregnant.  Ask for birth control (contraception) if you want to prevent pregnancy. Osteoporosis and menopause Osteoporosis is a disease in which the bones lose minerals and strength with aging. This can result in bone fractures. If you are 65 years old or older, or if you are at risk for osteoporosis and fractures, ask your health care provider if you should:  Be screened for bone loss.  Take a calcium or vitamin D supplement to lower your risk of fractures.  Be given hormone replacement therapy (HRT) to treat symptoms of menopause. Follow these instructions at home: Lifestyle  Do not use any products that contain nicotine or tobacco, such as cigarettes, e-cigarettes, and chewing tobacco. If you need help quitting, ask your health care provider.  Do not use street drugs.  Do not share needles.  Ask your health care provider for help if you need support or information about quitting drugs. Alcohol use  Do not drink alcohol if: ? Your health care provider tells you not to drink. ? You are pregnant, may be pregnant, or are planning to become pregnant.  If you drink alcohol: ? Limit how much you use to 0-1 drink a day. ? Limit intake if you are breastfeeding.  Be aware of how much alcohol is in your drink. In the [place] one drink equals one 12 oz bottle of beer (355 mL), one 5 oz glass of wine (148 mL), or one 1 oz glass of hard liquor (44 mL). General instructions  Schedule regular health, dental, and eye exams.  Stay current with your  vaccines.  Tell your health care provider if: ? You often feel depressed. ? You have ever been abused or do not feel safe at home. Summary    Adopting a healthy lifestyle and getting preventive care are important in promoting health and wellness.  Follow your health care provider's instructions about healthy diet, exercising, and getting tested or screened for diseases.  Follow your health care provider's instructions on monitoring your cholesterol and blood pressure. This information is not intended to replace advice given to you by your health care provider. Make sure you discuss any questions you have with your health care provider. Document Revised: 09/25/2018 Document Reviewed: 09/25/2018 [employer] Patient Education  2020 [employer]  

## 2020-04-23 NOTE — Progress Notes (Signed)
[facility] [place], [place]  [address] Phone:  [phone]   Fax:  [phone]  Subjective:     Doris Green is a 64 y.o. female and is here for a comprehensive physical exam. The patient reports no problems.  Social History   Socioeconomic History  . Marital status: Single    Spouse name: Not on file  . Number of children: 3  . Years of education: GED  . Highest education level: Not on file  Occupational History  . Occupation: Custodian    Employer: [employer]  Tobacco Use  . Smoking status: Current Some Day Smoker    Packs/day: 0.25    Types: Cigarettes    Last attempt to quit: 10/19/2011    Years since quitting: 8.5  . Smokeless tobacco: Never Used  . Tobacco comment: 2-3  cigs// per day  Vaping Use  . Vaping Use: Never used  Substance and Sexual Activity  . Alcohol use: No  . Drug use: No  . Sexual activity: Not on file  Other Topics Concern  . Not on file  Social History Narrative  . Not on file   Social Determinants of Health   Financial Resource Strain:   . Difficulty of Paying Living Expenses:   Food Insecurity:   . Worried About Charity fundraiser in the Last Year:   . Arboriculturist in the Last Year:   Transportation Needs:   . Film/video editor (Medical):   Marland Kitchen Lack of Transportation (Non-Medical):   Physical Activity:   . Days of Exercise per Week:   . Minutes of Exercise per Session:   Stress:   . Feeling of Stress :   Social Connections:   . Frequency of Communication with Friends and Family:   . Frequency of Social Gatherings with Friends and Family:   . Attends Religious Services:   . Active Member of Clubs or Organizations:   . Attends Archivist Meetings:   Marland Kitchen Marital Status:   Intimate Partner Violence:   . Fear of Current or Ex-Partner:   . Emotionally Abused:   Marland Kitchen Physically Abused:   . Sexually Abused:    Health Maintenance  Topic Date Due  . COVID-19 Vaccine (1)  Never done  . INFLUENZA VACCINE  05/16/2020  . MAMMOGRAM  09/26/2021  . COLONOSCOPY  11/24/2021  . PAP SMEAR-Modifier  04/24/2023  . TETANUS/TDAP  01/13/2024  . Hepatitis C Screening  Completed  . HIV Screening  Completed    The following portions of the patient's history were reviewed and updated as appropriate: allergies, current medications, past family history, past medical history, past social history, past surgical history and problem list.  Review of Systems A comprehensive review of systems was negative.   Objective:    BP (!) 159/95   Pulse 65   Temp (!) 97 F (36.1 C)   Ht 5' 2"  (1.575 m)   Wt 225 lb 4 oz (102.2 kg)   SpO2 100%   BMI 41.20 kg/m  General appearance: alert, cooperative, appears stated age and no distress Breasts: normal appearance, no masses or tenderness Pelvic: cervix normal in appearance, external genitalia normal, no adnexal masses or tenderness, no cervical motion tenderness, rectovaginal septum normal, uterus normal size, shape, and consistency and vagina normal without discharge        Assessment  Primary Diagnosis & Pertinent Problem List: The primary encounter diagnosis was Encounter for breast cancer  screening other than mammogram. Diagnoses of Routine general medical examination at a health care facility and Cervical cancer screening were also pertinent to this visit.  Visit Diagnosis: 1. Encounter for breast cancer screening other than mammogram   2. Routine general medical examination at a health care facility   3. Cervical cancer screening     Plan of Care  Lab-work, procedure(s), and/or referral(s): Orders Placed This Encounter  Procedures  . MM DIGITAL SCREENING BILATERAL   Imaging and/or referral(s): MM DIGITAL SCREENING BILATERAL  Provider-requested follow-up: Return for Appointment As Scheduled.  Future Appointments  Date Time Provider [place]  05/19/2020 11:20 AM Vevelyn Francois, NP [place] None    Note by:  Vevelyn Francois NP Date: 04/23/2020; Time: 11:44 AM  Patient instructions provided during this appointment: Patient Instructions  Cancer Screening for Women A cancer screening is a test or exam that checks for cancer. Your health care provider will recommend specific cancer screenings based on your age, personal history, and family history of cancer. Work with your health care provider to create a cancer screening schedule that protects your health. Why is cancer screening done? Cancer screening is done to look for cancer in the very early stages, before it spreads and becomes harder to treat and before you would start to notice symptoms. Finding cancer early improves the chances of successful treatment. It may save your life. Who should be screened for cancer? All women should be screened for certain cancers, including breast cancer, cervical cancer, and skin cancer. Your health care provider may recommend screenings for other types of cancer if:  You had cancer before.  You have a family member with cancer.  You have abnormal genes that could increase the risk of cancer.  You have risk factors for certain cancers, such as smoking. When you should be screened for cancer depends on:  Your age.  Your medical history and your family's medical history.  Certain lifestyle factors, such as smoking.  Environmental exposure, such as to asbestos. What are some common cancer screenings? Breast cancer Breast cancer screening is done with a test that takes images of breast tissue (mammogram). Here are some screening guidelines:  When you are age 23-44, you will be given the choice to start having mammograms.  When you are age 73-54, you should have a mammogram every year.  You may start having mammograms before age 27 if you have risk factors for breast cancer, such as having an immediate family member with breast cancer.  When you are age 39 or older, you should have a mammogram every 1-2  years for as long as you are in good health and have a life expectancy of 10 years or more.  It is important to know what your breasts look and feel like so you can report any changes to your health care provider.  Cervical cancer Cervical cancer screening is done with a Pap test. This testchecks for abnormalities, including the virus that causes cervical cancer (human papillomavirus, or HPV). To perform the test, a health care provider takes a swab of cervical cells during a pelvic exam. Screening for cervical cancer with a Pap test should start at age 36. Here are some screening guidelines:  When you are age 43-29, you should have a Pap test every 3 years.  When you are age 57-65, you should have a Pap test and HPV test every 5 years or have a Pap test every 3 years.  You may be screened for  cervical cancer more often if you have risk factors for cervical cancer.  If your Pap tests are abnormal, you may have an HPV test.  If you have had the HPV vaccine, you will still be screened for cervical cancer and follow normal screening recommendations. You do not need to be screened for cervical cancer if any of the following apply to you:  You are older than age 66 and you have not had a serious cervical precancer or cancer in the last 20 years.  Your cervix and uterus have been removed and you have never had cervical cancer or precancerous cells. Endometrial cancer There is no standard screening test for endometrial cancer, but the cancer can be detected with:  A test of a sample of tissue taken from the lining of the uterus (endometrial tissue biopsy).  A vaginal ultrasound.  Pap tests. If you are at increased risk for endometrial cancer, you may need to have these tests more often than normal. You are at increased risk if:  You have a family history of ovarian, uterine, or colon cancer.  You are taking tamoxifen, a drug that is used to treat breast cancer.  You have certain types of  colon cancer. If you have reached menopause, it is especially important to talk with your health care provider about any vaginal bleeding or spotting. Screening for endometrial cancer is not recommended for women who do not have symptoms of the cancer, such as vaginal bleeding. Colorectal cancer  All adults should have screening for colorectal cancer starting at age 80 and continuing until age 56. Your health care provider may recommend screening at age 51. You will have tests every 1-10 years, depending on your results and the type of screening test. If you have a family history of colon or rectal cancer or other risk factors, you may need to start having screenings earlier. Talk with your health care provider about which screening test is right for you and how often you should be screened. Colorectal cancer screening looks for cancer or for growths called polyps that often form before cancer starts. Tests to look for cancer or polyps include:  Colonoscopy or flexible sigmoidoscopy. For these procedures, a flexible tube with a small camera is inserted into the rectum.  CT colonography. This test uses X-rays and a contrast dye to check the colon for polyps. If a polyp is found, you may need to have a colonoscopy so the polyp can be located and removed. Tests to look for cancer in the stool (feces) include:  Guaiac-based fecal occult blood test (FOBT). This test detects blood in stool. It can be done at home with a kit.  Fecal immunochemical test (FIT). This test detects blood in stool. For this test, you will need to collect stool samples at home.  Stool DNA test. This test looks for blood in stool and any changes in DNA that can lead to colon cancer. For this test, you will need to collect a stool sample at home and send it to a lab.  Skin cancer Skin cancer screening is done by checking the skin for unusual moles or spots and any changes in existing moles. Your health care provider should check  your skin for signs of skin cancer at every physical exam. You should check your skin every month and tell your health care provider right away if anything looks unusual. Women with a higher-than-normal risk for skin cancer may want to see a skin specialist (dermatologist) for an annual  body check. Lung cancer Lung cancer screening is done with a CT scan that looks for abnormal cells in the lungs. Discuss lung cancer screening with your health care provider if you are 69-52 years old and if any of the following apply to you:  You currently smoke.  You used to smoke heavily.  You have a smoking history of 1 pack a day for 30 years or 2 packs a day for 15 years.  You have quit smoking within the past 15 years. If you smoke heavily or if you used to smoke, you may need to be screened every year. Where to find more information  [place]: SkinPromotion.no  Centers for Disease Control and Prevention: http://knight-sullivan.biz/  Department of Health and Human Services: BankingDetective.si Contact a health care provider if:  You have concerns about any signs or symptoms of cancer, such as: ? Moles that have an unusual shape or color. ? Changes in existing moles. ? A sore on your skin that does not heal. ? Blood in your stool. ? Fatigue that does not go away. ? Frequent pain or cramping in your abdomen. ? Coughing, or coughing up blood. ? Losing weight without trying. ? Lumps or other changes in your breasts. ? Vaginal bleeding, spotting, or changes in your periods. Summary  Be aware of and watch for signs and symptoms of cancer, especially symptoms of breast cancer, cervical cancer, endometrial cancer, colorectal cancer, skin cancer, and lung cancer.  Early detection of cancer with cancer screening may save your life.  Talk with your health care  provider about your specific cancer risks.  Work together with your health care provider to create a cancer screening plan that is right for you. This information is not intended to replace advice given to you by your health care provider. Make sure you discuss any questions you have with your health care provider. Document Revised: 01/22/2019 Document Reviewed: 06/29/2016 Elsevier Patient Education  [place] Maintenance, Female Adopting a healthy lifestyle and getting preventive care are important in promoting health and wellness. Ask your health care provider about:  The right schedule for you to have regular tests and exams.  Things you can do on your own to prevent diseases and keep yourself healthy. What should I know about diet, weight, and exercise? Eat a healthy diet   Eat a diet that includes plenty of vegetables, fruits, low-fat dairy products, and lean protein.  Do not eat a lot of foods that are high in solid fats, added sugars, or sodium. Maintain a healthy weight Body mass index (BMI) is used to identify weight problems. It estimates body fat based on height and weight. Your health care provider can help determine your BMI and help you achieve or maintain a healthy weight. Get regular exercise Get regular exercise. This is one of the most important things you can do for your health. Most adults should:  Exercise for at least 150 minutes each week. The exercise should increase your heart rate and make you sweat (moderate-intensity exercise).  Do strengthening exercises at least twice a week. This is in addition to the moderate-intensity exercise.  Spend less time sitting. Even light physical activity can be beneficial. Watch cholesterol and blood lipids Have your blood tested for lipids and cholesterol at 64 years of age, then have this test every 5 years. Have your cholesterol levels checked more often if:  Your lipid or cholesterol levels are  high.  You are older than  64 years of age.  You are at high risk for heart disease. What should I know about cancer screening? Depending on your health history and family history, you may need to have cancer screening at various ages. This may include screening for:  Breast cancer.  Cervical cancer.  Colorectal cancer.  Skin cancer.  Lung cancer. What should I know about heart disease, diabetes, and high blood pressure? Blood pressure and heart disease  High blood pressure causes heart disease and increases the risk of stroke. This is more likely to develop in people who have high blood pressure readings, are of African descent, or are overweight.  Have your blood pressure checked: ? Every 3-5 years if you are 55-49 years of age. ? Every year if you are 43 years old or older. Diabetes Have regular diabetes screenings. This checks your fasting blood sugar level. Have the screening done:  Once every three years after age 56 if you are at a normal weight and have a low risk for diabetes.  More often and at a younger age if you are overweight or have a high risk for diabetes. What should I know about preventing infection? Hepatitis B If you have a higher risk for hepatitis B, you should be screened for this virus. Talk with your health care provider to find out if you are at risk for hepatitis B infection. Hepatitis C Testing is recommended for:  Everyone born from [birth year] through 1965.  Anyone with known risk factors for hepatitis C. Sexually transmitted infections (STIs)  Get screened for STIs, including gonorrhea and chlamydia, if: ? You are sexually active and are younger than 64 years of age. ? You are older than 64 years of age and your health care provider tells you that you are at risk for this type of infection. ? Your sexual activity has changed since you were last screened, and you are at increased risk for chlamydia or gonorrhea. Ask your health care provider if you  are at risk.  Ask your health care provider about whether you are at high risk for HIV. Your health care provider may recommend a prescription medicine to help prevent HIV infection. If you choose to take medicine to prevent HIV, you should first get tested for HIV. You should then be tested every 3 months for as long as you are taking the medicine. Pregnancy  If you are about to stop having your period (premenopausal) and you may become pregnant, seek counseling before you get pregnant.  Take 400 to 800 micrograms (mcg) of folic acid every day if you become pregnant.  Ask for birth control (contraception) if you want to prevent pregnancy. Osteoporosis and menopause Osteoporosis is a disease in which the bones lose minerals and strength with aging. This can result in bone fractures. If you are 52 years old or older, or if you are at risk for osteoporosis and fractures, ask your health care provider if you should:  Be screened for bone loss.  Take a calcium or vitamin D supplement to lower your risk of fractures.  Be given hormone replacement therapy (HRT) to treat symptoms of menopause. Follow these instructions at home: Lifestyle  Do not use any products that contain nicotine or tobacco, such as cigarettes, e-cigarettes, and chewing tobacco. If you need help quitting, ask your health care provider.  Do not use street drugs.  Do not share needles.  Ask your health care provider for help if you need support or information about quitting drugs.  Alcohol use  Do not drink alcohol if: ? Your health care provider tells you not to drink. ? You are pregnant, may be pregnant, or are planning to become pregnant.  If you drink alcohol: ? Limit how much you use to 0-1 drink a day. ? Limit intake if you are breastfeeding.  Be aware of how much alcohol is in your drink. In the [place] one drink equals one 12 oz bottle of beer (355 mL), one 5 oz glass of wine (148 mL), or one 1 oz glass of hard  liquor (44 mL). General instructions  Schedule regular health, dental, and eye exams.  Stay current with your vaccines.  Tell your health care provider if: ? You often feel depressed. ? You have ever been abused or do not feel safe at home. Summary  Adopting a healthy lifestyle and getting preventive care are important in promoting health and wellness.  Follow your health care provider's instructions about healthy diet, exercising, and getting tested or screened for diseases.  Follow your health care provider's instructions on monitoring your cholesterol and blood pressure. This information is not intended to replace advice given to you by your health care provider. Make sure you discuss any questions you have with your health care provider. Document Revised: 09/25/2018 Document Reviewed: 09/25/2018 Elsevier Patient Education  2020 [employer].

## 2020-04-28 LAB — IGP, RFX APTIMA HPV ASCU

## 2020-05-19 ENCOUNTER — Ambulatory Visit: Payer: Self-pay | Admitting: Nurse Practitioner

## 2020-08-18 ENCOUNTER — Other Ambulatory Visit: Payer: Self-pay | Admitting: Family Medicine

## 2020-08-18 DIAGNOSIS — G8929 Other chronic pain: Secondary | ICD-10-CM

## 2020-08-18 DIAGNOSIS — M25561 Pain in right knee: Secondary | ICD-10-CM

## 2020-08-20 NOTE — Telephone Encounter (Signed)
Please see medication request.

## 2020-10-01 ENCOUNTER — Other Ambulatory Visit: Payer: Self-pay | Admitting: Nurse Practitioner

## 2020-10-01 DIAGNOSIS — Z1231 Encounter for screening mammogram for malignant neoplasm of breast: Secondary | ICD-10-CM

## 2020-11-11 ENCOUNTER — Ambulatory Visit: Payer: BC Managed Care – PPO

## 2021-01-05 ENCOUNTER — Ambulatory Visit (HOSPITAL_COMMUNITY)
Admission: RE | Admit: 2021-01-05 | Discharge: 2021-01-05 | Disposition: A | Discharge status: Home / Self Care | Payer: BC Managed Care – PPO | Source: Ambulatory Visit | Attending: Nurse Practitioner | Admitting: Nurse Practitioner

## 2021-01-05 ENCOUNTER — Encounter: Payer: Self-pay | Admitting: Nurse Practitioner

## 2021-01-05 ENCOUNTER — Ambulatory Visit (INDEPENDENT_AMBULATORY_CARE_PROVIDER_SITE_OTHER): Payer: Self-pay | Admitting: Nurse Practitioner

## 2021-01-05 ENCOUNTER — Other Ambulatory Visit: Payer: Self-pay

## 2021-01-05 VITALS — BP 155/84 | HR 75 | Ht 62.0 in | Wt 221.0 lb

## 2021-01-05 DIAGNOSIS — F339 Major depressive disorder, recurrent, unspecified: Secondary | ICD-10-CM

## 2021-01-05 DIAGNOSIS — M25561 Pain in right knee: Secondary | ICD-10-CM | POA: Diagnosis not present

## 2021-01-05 DIAGNOSIS — I10 Essential (primary) hypertension: Secondary | ICD-10-CM

## 2021-01-05 DIAGNOSIS — Z13220 Encounter for screening for lipoid disorders: Secondary | ICD-10-CM

## 2021-01-05 DIAGNOSIS — Z13820 Encounter for screening for osteoporosis: Secondary | ICD-10-CM

## 2021-01-05 DIAGNOSIS — Z780 Asymptomatic menopausal state: Secondary | ICD-10-CM

## 2021-01-05 MED ORDER — HYDROCHLOROTHIAZIDE 12.5 MG PO TABS: 12.5000 mg | ORAL_TABLET | Freq: Every day | ORAL | 11 refills | Status: DC

## 2021-01-05 MED ORDER — AMLODIPINE BESYLATE 2.5 MG PO TABS: 2.5000 mg | ORAL_TABLET | Freq: Every day | ORAL | 11 refills | Status: DC

## 2021-01-05 NOTE — Progress Notes (Signed)
[place], [place]  [address] Phone:  [phone]   Fax:  [phone]   Established Patient Office Visit  Subjective:  Patient ID: Doris Green, female    DOB: 09/14/1956  Age: 65 y.o. MRN: [id number]  CC:  Chief Complaint  Patient presents with  . Hypertension    HPI Doris Green presents for follow up. She  has a past medical history of Allergy, Arthritis, Depression, History of blood transfusion, Spider bite, and UTERINE FIBROID (12/13/2006).   Hypertension Patient is here for follow-up of elevated blood pressure. She is not exercising and is not adherent to a low-salt diet. Blood pressure is not motinored at home. Cardiac symptoms: dyspnea and lower extremity edema. Patient denies chest pain, chest pressure/discomfort, claudication, dyspnea, exertional chest pressure/discomfort, fatigue, irregular heart beat, lower extremity edema, near-syncope, orthopnea, palpitations, paroxysmal nocturnal dyspnea, syncope and tachypnea. Cardiovascular risk factors: advanced age (older than 69 for men, 51 for women), hypertension, obesity (BMI >= 30 kg/m2) and smoking/ tobacco exposure. Use of agents associated with hypertension: NSAIDS. History of target organ damage: none. She had been out of the her HCTZ for 3-4 days. However she has noticed swelling legs with the medication. She has started back smoking.   She is planning on retiring in one month. She is having increased stress.  She has some domestic issues with her children. She has to decide on her future plans   Past Medical History:  Diagnosis Date  . Allergy   . Arthritis   . Depression   . History of blood transfusion   . Spider bite    2016 and 2017  . UTERINE FIBROID 12/13/2006    Past Surgical History:  Procedure Laterality Date  . SHOULDER SURGERY    . TUBAL LIGATION      Family History  Problem Relation Age of Onset  . Diabetes Mother   . Hypertension Mother    . Heart disease Mother   . Cancer Father   . Diabetes Sister   . Hypertension Sister   . Heart disease Sister   . Hypertension Brother   . Diabetes Brother   . Cancer Maternal Grandmother   . Colon cancer Neg Hx   . Colon polyps Neg Hx   . Esophageal cancer Neg Hx   . Stomach cancer Neg Hx   . Rectal cancer Neg Hx   . Breast cancer Neg Hx     Social History   Socioeconomic History  . Marital status: Single    Spouse name: Not on file  . Number of children: 3  . Years of education: GED  . Highest education level: Not on file  Occupational History  . Occupation: Custodian    Employer: [employer]  Tobacco Use  . Smoking status: Current Some Day Smoker    Packs/day: 0.25    Types: Cigarettes    Last attempt to quit: 10/19/2011    Years since quitting: 9.2  . Smokeless tobacco: Never Used  . Tobacco comment: 2-3  cigs// per day  Vaping Use  . Vaping Use: Never used  Substance and Sexual Activity  . Alcohol use: No  . Drug use: No  . Sexual activity: Not on file  Other Topics Concern  . Not on file  Social History Narrative  . Not on file   Social Determinants of Health   Financial Resource Strain: Not on file  Food Insecurity: Not on file  Transportation Needs:  Not on file  Physical Activity: Not on file  Stress: Not on file  Social Connections: Not on file  Intimate Partner Violence: Not on file    Outpatient Medications Prior to Visit  Medication Sig Dispense Refill  . Ascorbic Acid (VITAMIN C) 1000 MG tablet Take 1,000 mg by mouth daily.    . cholecalciferol (VITAMIN D3) 25 MCG (1000 UT) tablet Take 1,000 Units by mouth daily.    Marland Kitchen loratadine (CLARITIN) 10 MG tablet Take 1 tablet (10 mg total) by mouth daily. 30 tablet 11  . meloxicam (MOBIC) 7.5 MG tablet TAKE 1 TABLET(7.5 MG) BY MOUTH DAILY 30 tablet 0  . Multiple Vitamins-Minerals (WOMENS HAIR, SKIN & NAILS PO) Take by mouth.    . Omega-3 1000 MG CAPS Take by mouth.    Marland Kitchen OVER THE COUNTER  MEDICATION Beet chew, Boisenberry Gold    . Probiotic Product (PROBIOTIC ADVANCED PO) Take by mouth as needed.    . hydrochlorothiazide (HYDRODIURIL) 12.5 MG tablet TAKE 1 TABLET(12.5 MG) BY MOUTH DAILY 90 tablet 1  . fluticasone (FLONASE) 50 MCG/ACT nasal spray Place 2 sprays into both nostrils daily. (Patient not taking: Reported on 01/14/2020) 16 g 6   No facility-administered medications prior to visit.    Allergies  Allergen Reactions  . Ibuprofen     Blood in stool    ROS Review of Systems  Respiratory: Positive for shortness of breath (on exertion).   Cardiovascular: Positive for leg swelling. Negative for chest pain.  Neurological: Positive for headaches (generalized ).      Objective:    Physical Exam Constitutional:      Appearance: She is obese.  HENT:     Head: Normocephalic and atraumatic.     Nose: Nose normal.     Mouth/Throat:     Mouth: Mucous membranes are moist.  Cardiovascular:     Rate and Rhythm: Normal rate. Rhythm irregular.     Pulses: Normal pulses.  Pulmonary:     Effort: Pulmonary effort is normal.     Comments: Lung sounds diminished in bases Abdominal:     General: Bowel sounds are normal.     Palpations: Abdomen is soft.  Musculoskeletal:        General: Normal range of motion.  Skin:    General: Skin is warm and dry.     Capillary Refill: Capillary refill takes less than 2 seconds.  Neurological:     General: No focal deficit present.     Mental Status: She is alert and oriented to person, place, and time.  Psychiatric:        Mood and Affect: Mood normal.        Behavior: Behavior normal.        Thought Content: Thought content normal.        Judgment: Judgment normal.     BP (!) 155/84 Comment: no medication  Pulse 75   Ht 5\' 2"  (1.575 m)   Wt 221 lb (100.2 kg)   SpO2 100%   BMI 40.42 kg/m  Wt Readings from Last 3 Encounters:  01/05/21 221 lb (100.2 kg)  04/23/20 225 lb 4 oz (102.2 kg)  04/21/20 224 lb 0.4 oz (101.6  kg)     Health Maintenance Due  Topic Date Due  . PNA vac Low Risk Adult (1 of 2 - PCV13) 12/24/2020    There are no preventive care reminders to display for this patient.  Lab Results  Component Value Date   TSH  1.930 01/15/2020   Lab Results  Component Value Date   WBC 8.4 01/15/2020   HGB 12.9 01/15/2020   HCT 39.4 01/15/2020   MCV 80 01/15/2020   PLT 283 01/15/2020   Lab Results  Component Value Date   NA 140 01/15/2020   K 4.5 01/15/2020   CO2 23 08/12/2019   GLUCOSE 104 (H) 01/15/2020   BUN 12 01/15/2020   CREATININE 0.87 01/15/2020   BILITOT 0.2 01/15/2020   ALKPHOS 86 01/15/2020   AST 17 01/15/2020   ALT 15 08/07/2018   PROT 6.7 01/15/2020   ALBUMIN 4.4 01/15/2020   CALCIUM 9.7 01/15/2020   Lab Results  Component Value Date   CHOL 191 01/15/2020   Lab Results  Component Value Date   HDL 75 01/15/2020   Lab Results  Component Value Date   LDLCALC 96 01/15/2020   Lab Results  Component Value Date   TRIG 115 01/15/2020   Lab Results  Component Value Date   CHOLHDL 2.5 01/15/2020   Lab Results  Component Value Date   HGBA1C 5.7 (A) 08/12/2019      Assessment & Plan:   Problem List Items Addressed This Visit      Cardiovascular and Mediastinum   Essential hypertension Long discussion about medication she will try return to HCTZ. Encouraged home monitoring and recording BP <130/80 If BP is elevated then she will start Eating a heart-healthy diet with less salt Encouraged regular physical activity  Recommend Weight loss   Relevant Medications   amLODipine (NORVASC) 2.5 MG tablet   hydrochlorothiazide (HYDRODIURIL) 12.5 MG tablet   Other Relevant Orders   Comp. Metabolic Panel (12)     Other   Morbid obesity, unspecified obesity type ([place])    Other Visit Diagnoses    Osteoporosis screening      Relevant Orders   DG Bone Density   Postmenopausal       Relevant Orders   DG Bone Density   Episode of recurrent major depressive  disorder, unspecified depression episode severity ([place])   (Chronic)     Screening for cholesterol level       Relevant Orders   Lipid panel   Acute pain of right knee       Relevant Orders   DG Knee 1-2 Views Right      Meds ordered this encounter  Medications  . amLODipine (NORVASC) 2.5 MG tablet    Sig: Take 1 tablet (2.5 mg total) by mouth daily.    Dispense:  30 tablet    Refill:  11    Order Specific Question:   Supervising Provider    Answer:   Tresa Garter [id number]  . hydrochlorothiazide (HYDRODIURIL) 12.5 MG tablet    Sig: Take 1 tablet (12.5 mg total) by mouth daily. TAKE 1 TABLET(12.5 MG) BY MOUTH DAILY    Dispense:  30 tablet    Refill:  11    Order Specific Question:   Supervising Provider    Answer:   Tresa Garter [[id number]]    Follow-up: Return in about 6 weeks (around 02/16/2021) for Follow up HTN [id number].    Vevelyn Francois, NP

## 2021-01-05 NOTE — Patient Instructions (Addendum)
Managing Your Hypertension Hypertension, also called high blood pressure, is when the force of the blood pressing against the walls of the arteries is too strong. Arteries are blood vessels that carry blood from your heart throughout your body. Hypertension forces the heart to work harder to pump blood and may cause the arteries to become narrow or stiff. Understanding blood pressure readings Your personal target blood pressure may vary depending on your medical conditions, your age, and other factors. A blood pressure reading includes a higher number over a lower number. Ideally, your blood pressure should be below 120/80. You should know that:  The first, or top, number is called the systolic pressure. It is a measure of the pressure in your arteries as your heart beats.  The second, or bottom number, is called the diastolic pressure. It is a measure of the pressure in your arteries as the heart relaxes. Blood pressure is classified into four stages. Based on your blood pressure reading, your health care provider may use the following stages to determine what type of treatment you need, if any. Systolic pressure and diastolic pressure are measured in a unit called mmHg. Normal  Systolic pressure: below 568.  Diastolic pressure: below 80. Elevated  Systolic pressure: 616-837.  Diastolic pressure: below 80. Hypertension stage 1  Systolic pressure: 290-211.  Diastolic pressure: 15-52. Hypertension stage 2  Systolic pressure: 080 or above.  Diastolic pressure: 90 or above. How can this condition affect me? Managing your hypertension is an important responsibility. Over time, hypertension can damage the arteries and decrease blood flow to important parts of the body, including the brain, heart, and kidneys. Having untreated or uncontrolled hypertension can lead to:  A heart attack.  A stroke.  A weakened blood vessel (aneurysm).  Heart failure.  Kidney damage.  Eye  damage.  Metabolic syndrome.  Memory and concentration problems.  Vascular dementia. What actions can I take to manage this condition? Hypertension can be managed by making lifestyle changes and possibly by taking medicines. Your health care provider will help you make a plan to bring your blood pressure within a normal range. Nutrition  Eat a diet that is high in fiber and potassium, and low in salt (sodium), added sugar, and fat. An example eating plan is called the Dietary Approaches to Stop Hypertension (DASH) diet. To eat this way: ? Eat plenty of fresh fruits and vegetables. Try to fill one-half of your plate at each meal with fruits and vegetables. ? Eat whole grains, such as whole-wheat pasta, brown rice, or whole-grain bread. Fill about one-fourth of your plate with whole grains. ? Eat low-fat dairy products. ? Avoid fatty cuts of meat, processed or cured meats, and poultry with skin. Fill about one-fourth of your plate with lean proteins such as fish, chicken without skin, beans, eggs, and tofu. ? Avoid pre-made and processed foods. These tend to be higher in sodium, added sugar, and fat.  Reduce your daily sodium intake. Most people with hypertension should eat less than 1,500 mg of sodium a day.   Lifestyle  Work with your health care provider to maintain a healthy body weight or to lose weight. Ask what an ideal weight is for you.  Get at least 30 minutes of exercise that causes your heart to beat faster (aerobic exercise) most days of the week. Activities may include walking, swimming, or biking.  Include exercise to strengthen your muscles (resistance exercise), such as weight lifting, as part of your weekly exercise routine. Try  to do these types of exercises for 30 minutes at least 3 days a week.  Do not use any products that contain nicotine or tobacco, such as cigarettes, e-cigarettes, and chewing tobacco. If you need help quitting, ask your health care  provider.  Control any long-term (chronic) conditions you have, such as high cholesterol or diabetes.  Identify your sources of stress and find ways to manage stress. This may include meditation, deep breathing, or making time for fun activities.   Alcohol use  Do not drink alcohol if: ? Your health care provider tells you not to drink. ? You are pregnant, may be pregnant, or are planning to become pregnant.  If you drink alcohol: ? Limit how much you use to:  0-1 drink a day for women.  0-2 drinks a day for men. ? Be aware of how much alcohol is in your drink. In the [place] one drink equals one 12 oz bottle of beer (355 mL), one 5 oz glass of wine (148 mL), or one 1 oz glass of hard liquor (44 mL). Medicines Your health care provider may prescribe medicine if lifestyle changes are not enough to get your blood pressure under control and if:  Your systolic blood pressure is 130 or higher.  Your diastolic blood pressure is 80 or higher. Take medicines only as told by your health care provider. Follow the directions carefully. Blood pressure medicines must be taken as told by your health care provider. The medicine does not work as well when you skip doses. Skipping doses also puts you at risk for problems. Monitoring Before you monitor your blood pressure:  Do not smoke, drink caffeinated beverages, or exercise within 30 minutes before taking a measurement.  Use the bathroom and empty your bladder (urinate).  Sit quietly for at least 5 minutes before taking measurements. Monitor your blood pressure at home as told by your health care provider. To do this:  Sit with your back straight and supported.  Place your feet flat on the floor. Do not cross your legs.  Support your arm on a flat surface, such as a table. Make sure your upper arm is at heart level.  Each time you measure, take two or three readings one minute apart and record the results. You may also need to have your  blood pressure checked regularly by your health care provider.   General information  Talk with your health care provider about your diet, exercise habits, and other lifestyle factors that may be contributing to hypertension.  Review all the medicines you take with your health care provider because there may be side effects or interactions.  Keep all visits as told by your health care provider. Your health care provider can help you create and adjust your plan for managing your high blood pressure. Where to find more information  National Heart, Lung, and Blood Institute: www.nhlbi.nih.gov  American Heart Association: www.heart.org Contact a health care provider if:  You think you are having a reaction to medicines you have taken.  You have repeated (recurrent) headaches.  You feel dizzy.  You have swelling in your ankles.  You have trouble with your vision. Get help right away if:  You develop a severe headache or confusion.  You have unusual weakness or numbness, or you feel faint.  You have severe pain in your chest or abdomen.  You vomit repeatedly.  You have trouble breathing. These symptoms may represent a serious problem that is an emergency. Do not wait   to see if the symptoms will go away. Get medical help right away. Call your local emergency services (911 in the [place] Do not drive yourself to the hospital. Summary  Hypertension is when the force of blood pumping through your arteries is too strong. If this condition is not controlled, it may put you at risk for serious complications.  Your personal target blood pressure may vary depending on your medical conditions, your age, and other factors. For most people, a normal blood pressure is less than 120/80.  Hypertension is managed by lifestyle changes, medicines, or both.  Lifestyle changes to help manage hypertension include losing weight, eating a healthy, low-sodium diet, exercising more, stopping smoking, and  limiting alcohol. This information is not intended to replace advice given to you by your health care provider. Make sure you discuss any questions you have with your health care provider. Document Revised: 11/07/2019 Document Reviewed: 09/02/2019 Elsevier Patient Education  2021 [place] .   Steps to Quit Smoking Smoking tobacco is the leading cause of preventable death. It can affect almost every organ in the body. Smoking puts you and people around you at risk for many serious, long-lasting (chronic) diseases. Quitting smoking can be hard, but it is one of the best things that you can do for your health. It is never too late to quit. How do I get ready to quit? When you decide to quit smoking, make a plan to help you succeed. Before you quit:  Pick a date to quit. Set a date within the next 2 weeks to give you time to prepare.  Write down the reasons why you are quitting. Keep this list in places where you will see it often.  Tell your family, friends, and co-workers that you are quitting. Their support is important.  Talk with your doctor about the choices that may help you quit.  Find out if your health insurance will pay for these treatments.  Know the people, places, things, and activities that make you want to smoke (triggers). Avoid them. What first steps can I take to quit smoking?  Throw away all cigarettes at home, at work, and in your car.  Throw away the things that you use when you smoke, such as ashtrays and lighters.  Clean your car. Make sure to empty the ashtray.  Clean your home, including curtains and carpets. What can I do to help me quit smoking? Talk with your doctor about taking medicines and seeing a counselor at the same time. You are more likely to succeed when you do both.  If you are pregnant or breastfeeding, talk with your doctor about counseling or other ways to quit smoking. Do not take medicine to help you quit smoking unless your doctor tells  you to do so. To quit smoking: Quit right away  Quit smoking totally, instead of slowly cutting back on how much you smoke over a period of time.  Go to counseling. You are more likely to quit if you go to counseling sessions regularly. Take medicine You may take medicines to help you quit. Some medicines need a prescription, and some you can buy over-the-counter. Some medicines may contain a drug called nicotine to replace the nicotine in cigarettes. Medicines may:  Help you to stop having the desire to smoke (cravings).  Help to stop the problems that come when you stop smoking (withdrawal symptoms). Your doctor may ask you to use:  Nicotine patches, gum, or lozenges.  Nicotine inhalers or sprays.  Non-nicotine medicine that is taken by mouth. Find resources Find resources and other ways to help you quit smoking and remain smoke-free after you quit. These resources are most helpful when you use them often. They include:  Online chats with a Social worker.  Phone quitlines.  Printed Furniture conservator/restorer.  Support groups or group counseling.  Text messaging programs.  Mobile phone apps. Use apps on your mobile phone or tablet that can help you stick to your quit plan. There are many free apps for mobile phones and tablets as well as websites. Examples include Quit Guide from the [employer] and smokefree.gov   What things can I do to make it easier to quit?  Talk to your family and friends. Ask them to support and encourage you.  Call a phone quitline (1-800-QUIT-NOW), reach out to support groups, or work with a Social worker.  Ask people who smoke to not smoke around you.  Avoid places that make you want to smoke, such as: ? Bars. ? Parties. ? Smoke-break areas at work.  Spend time with people who do not smoke.  Lower the stress in your life. Stress can make you want to smoke. Try these things to help your stress: ? Getting regular exercise. ? Doing deep-breathing  exercises. ? Doing yoga. ? Meditating. ? Doing a body scan. To do this, close your eyes, focus on one area of your body at a time from head to toe. Notice which parts of your body are tense. Try to relax the muscles in those areas.   How will I feel when I quit smoking? Day 1 to 3 weeks Within the first 24 hours, you may start to have some problems that come from quitting tobacco. These problems are very bad 2-3 days after you quit, but they do not often last for more than 2-3 weeks. You may get these symptoms:  Mood swings.  Feeling restless, nervous, angry, or annoyed.  Trouble concentrating.  Dizziness.  Strong desire for high-sugar foods and nicotine.  Weight gain.  Trouble pooping (constipation).  Feeling like you may vomit (nausea).  Coughing or a sore throat.  Changes in how the medicines that you take for other issues work in your body.  Depression.  Trouble sleeping (insomnia). Week 3 and afterward After the first 2-3 weeks of quitting, you may start to notice more positive results, such as:  Better sense of smell and taste.  Less coughing and sore throat.  Slower heart rate.  Lower blood pressure.  Clearer skin.  Better breathing.  Fewer sick days. Quitting smoking can be hard. Do not give up if you fail the first time. Some people need to try a few times before they succeed. Do your best to stick to your quit plan, and talk with your doctor if you have any questions or concerns. Summary  Smoking tobacco is the leading cause of preventable death. Quitting smoking can be hard, but it is one of the best things that you can do for your health.  When you decide to quit smoking, make a plan to help you succeed.  Quit smoking right away, not slowly over a period of time.  When you start quitting, seek help from your doctor, family, or friends. This information is not intended to replace advice given to you by your health care provider. Make sure you  discuss any questions you have with your health care provider. Document Revised: 06/27/2019 Document Reviewed: 12/21/2018 Elsevier Patient Education  [place]  Eating Following a healthy eating pattern may help you to achieve and maintain a healthy body weight, reduce the risk of chronic disease, and live a long and productive life. It is important to follow a healthy eating pattern at an appropriate calorie level for your body. Your nutritional needs should be met primarily through food by choosing a variety of nutrient-rich foods. What are tips for following this plan? Reading food labels  Read labels and choose the following: ? Reduced or low sodium. ? Juices with 100% fruit juice. ? Foods with low saturated fats and high polyunsaturated and monounsaturated fats. ? Foods with whole grains, such as whole wheat, cracked wheat, brown rice, and wild rice. ? Whole grains that are fortified with folic acid. This is recommended for women who are pregnant or who want to become pregnant.  Read labels and avoid the following: ? Foods with a lot of added sugars. These include foods that contain brown sugar, corn sweetener, corn syrup, dextrose, fructose, glucose, high-fructose corn syrup, honey, invert sugar, lactose, malt syrup, maltose, molasses, raw sugar, sucrose, trehalose, or turbinado sugar.  Do not eat more than the following amounts of added sugar per day:  6 teaspoons (25 g) for women.  9 teaspoons (38 g) for men. ? Foods that contain processed or refined starches and grains. ? Refined grain products, such as white flour, degermed cornmeal, white bread, and white rice. Shopping  Choose nutrient-rich snacks, such as vegetables, whole fruits, and nuts. Avoid high-calorie and high-sugar snacks, such as potato chips, fruit snacks, and candy.  Use oil-based dressings and spreads on foods instead of solid fats such as butter, stick margarine, or cream cheese.  Limit  pre-made sauces, mixes, and "instant" products such as flavored rice, instant noodles, and ready-made pasta.  Try more plant-protein sources, such as tofu, tempeh, black beans, edamame, lentils, nuts, and seeds.  Explore eating plans such as the Mediterranean diet or vegetarian diet. Cooking  Use oil to saut or stir-fry foods instead of solid fats such as butter, stick margarine, or lard.  Try baking, boiling, grilling, or broiling instead of frying.  Remove the fatty part of meats before cooking.  Steam vegetables in water or broth. Meal planning  At meals, imagine dividing your plate into fourths: ? One-half of your plate is fruits and vegetables. ? One-fourth of your plate is whole grains. ? One-fourth of your plate is protein, especially lean meats, poultry, eggs, tofu, beans, or nuts.  Include low-fat dairy as part of your daily diet.   Lifestyle  Choose healthy options in all settings, including home, work, school, restaurants, or stores.  Prepare your food safely: ? Wash your hands after handling raw meats. ? Keep food preparation surfaces clean by regularly washing with hot, soapy water. ? Keep raw meats separate from ready-to-eat foods, such as fruits and vegetables. ? Cook seafood, meat, poultry, and eggs to the recommended internal temperature. ? Store foods at safe temperatures. In general:  Keep cold foods at 13F (4.4C) or below.  Keep hot foods at 113F (60C) or above.  Keep your freezer at [facility] (-17.8C) or below.  Foods are no longer safe to eat when they have been between the temperatures of 40-113F (4.4-60C) for more than 2 hours. What foods should I eat? Fruits Aim to eat 2 cup-equivalents of fresh, canned (in natural juice), or frozen fruits each day. Examples of 1 cup-equivalent of fruit include 1 small apple, 8 large strawberries, 1 cup canned fruit,  cup  dried fruit, or 1 cup 100% juice. Vegetables Aim to eat 2-3 cup-equivalents of fresh and  frozen vegetables each day, including different varieties and colors. Examples of 1 cup-equivalent of vegetables include 2 medium carrots, 2 cups raw, leafy greens, 1 cup chopped vegetable (raw or cooked), or 1 medium baked potato. Grains Aim to eat 6 ounce-equivalents of whole grains each day. Examples of 1 ounce-equivalent of grains include 1 slice of bread, 1 cup ready-to-eat cereal, 3 cups popcorn, or  cup cooked rice, pasta, or cereal. Meats and other proteins Aim to eat 5-6 ounce-equivalents of protein each day. Examples of 1 ounce-equivalent of protein include 1 egg, 1/2 cup nuts or seeds, or 1 tablespoon (16 g) peanut butter. A cut of meat or fish that is the size of a deck of cards is about 3-4 ounce-equivalents.  Of the protein you eat each week, try to have at least 8 ounces come from seafood. This includes salmon, trout, herring, and anchovies. Dairy Aim to eat 3 cup-equivalents of fat-free or low-fat dairy each day. Examples of 1 cup-equivalent of dairy include 1 cup (240 mL) milk, 8 ounces (250 g) yogurt, 1 ounces (44 g) natural cheese, or 1 cup (240 mL) fortified soy milk. Fats and oils  Aim for about 5 teaspoons (21 g) per day. Choose monounsaturated fats, such as canola and olive oils, avocados, peanut butter, and most nuts, or polyunsaturated fats, such as sunflower, corn, and soybean oils, walnuts, pine nuts, sesame seeds, sunflower seeds, and flaxseed. Beverages  Aim for six 8-oz glasses of water per day. Limit coffee to three to five 8-oz cups per day.  Limit caffeinated beverages that have added calories, such as soda and energy drinks.  Limit alcohol intake to no more than 1 drink a day for nonpregnant women and 2 drinks a day for men. One drink equals 12 oz of beer (355 mL), 5 oz of wine (148 mL), or 1 oz of hard liquor (44 mL). Seasoning and other foods  Avoid adding excess amounts of salt to your foods. Try flavoring foods with herbs and spices instead of  salt.  Avoid adding sugar to foods.  Try using oil-based dressings, sauces, and spreads instead of solid fats. This information is based on general [place] nutrition guidelines. For more information, visit Build[employer].es. Exact amounts may vary based on your nutrition needs. Summary  A healthy eating plan may help you to maintain a healthy weight, reduce the risk of chronic diseases, and stay active throughout your life.  Plan your meals. Make sure you eat the right portions of a variety of nutrient-rich foods.  Try baking, boiling, grilling, or broiling instead of frying.  Choose healthy options in all settings, including home, work, school, restaurants, or stores. This information is not intended to replace advice given to you by your health care provider. Make sure you discuss any questions you have with your health care provider. Document Revised: 01/14/2018 Document Reviewed: 01/14/2018 Elsevier Patient Education  2021 [place] for Nurse Practitioners, 15(4), [id number]. Retrieved July 22, 2018 from http://clinicalkey.com/nursing">  Knee Exercises Ask your health care provider which exercises are safe for you. Do exercises exactly as told by your health care provider and adjust them as directed. It is normal to feel mild stretching, pulling, tightness, or discomfort as you do these exercises. Stop right away if you feel sudden pain or your pain gets worse. Do not begin these exercises until told by your health care provider. Stretching and  range-of-motion exercises These exercises warm up your muscles and joints and improve the movement and flexibility of your knee. These exercises also help to relieve pain and swelling. Knee extension, prone 1. Lie on your abdomen (prone position) on a bed. 2. Place your left / right knee just beyond the edge of the surface so your knee is not on the bed. You can put a towel under your left / right thigh just above your kneecap for  comfort. 3. Relax your leg muscles and allow gravity to straighten your knee (extension). You should feel a stretch behind your left / right knee. 4. Hold this position for __________ seconds. 5. Scoot up so your knee is supported between repetitions. Repeat __________ times. Complete this exercise __________ times a day. Knee flexion, active 1. Lie on your back with both legs straight. If this causes back discomfort, bend your left / right knee so your foot is flat on the floor. 2. Slowly slide your left / right heel back toward your buttocks. Stop when you feel a gentle stretch in the front of your knee or thigh (flexion). 3. Hold this position for __________ seconds. 4. Slowly slide your left / right heel back to the starting position. Repeat __________ times. Complete this exercise __________ times a day.   Quadriceps stretch, prone 1. Lie on your abdomen on a firm surface, such as a bed or padded floor. 2. Bend your left / right knee and hold your ankle. If you cannot reach your ankle or pant leg, loop a belt around your foot and grab the belt instead. 3. Gently pull your heel toward your buttocks. Your knee should not slide out to the side. You should feel a stretch in the front of your thigh and knee (quadriceps). 4. Hold this position for __________ seconds. Repeat __________ times. Complete this exercise __________ times a day.   Hamstring, supine 1. Lie on your back (supine position). 2. Loop a belt or towel over the ball of your left / right foot. The ball of your foot is on the walking surface, right under your toes. 3. Straighten your left / right knee and slowly pull on the belt to raise your leg until you feel a gentle stretch behind your knee (hamstring). ? Do not let your knee bend while you do this. ? Keep your other leg flat on the floor. 4. Hold this position for __________ seconds. Repeat __________ times. Complete this exercise __________ times a day. Strengthening  exercises These exercises build strength and endurance in your knee. Endurance is the ability to use your muscles for a long time, even after they get tired. Quadriceps, isometric This exercise stretches the muscles in front of your thigh (quadriceps) without moving your knee joint (isometric). 1. Lie on your back with your left / right leg extended and your other knee bent. Put a rolled towel or small pillow under your knee if told by your health care provider. 2. Slowly tense the muscles in the front of your left / right thigh. You should see your kneecap slide up toward your hip or see increased dimpling just above the knee. This motion will push the back of the knee toward the floor. 3. For __________ seconds, hold the muscle as tight as you can without increasing your pain. 4. Relax the muscles slowly and completely. Repeat __________ times. Complete this exercise __________ times a day.   Straight leg raises This exercise stretches the muscles in front of your thigh (quadriceps)  and the muscles that move your hips (hip flexors). 1. Lie on your back with your left / right leg extended and your other knee bent. 2. Tense the muscles in the front of your left / right thigh. You should see your kneecap slide up or see increased dimpling just above the knee. Your thigh may even shake a bit. 3. Keep these muscles tight as you raise your leg 4-6 inches (10-15 cm) off the floor. Do not let your knee bend. 4. Hold this position for __________ seconds. 5. Keep these muscles tense as you lower your leg. 6. Relax your muscles slowly and completely after each repetition. Repeat __________ times. Complete this exercise __________ times a day. Hamstring, isometric 1. Lie on your back on a firm surface. 2. Bend your left / right knee about __________ degrees. 3. Dig your left / right heel into the surface as if you are trying to pull it toward your buttocks. Tighten the muscles in the back of your thighs  (hamstring) to "dig" as hard as you can without increasing any pain. 4. Hold this position for __________ seconds. 5. Release the tension gradually and allow your muscles to relax completely for __________ seconds after each repetition. Repeat __________ times. Complete this exercise __________ times a day. Hamstring curls If told by your health care provider, do this exercise while wearing ankle weights. Begin with __________ lb weights. Then increase the weight by 1 lb (0.5 kg) increments. Do not wear ankle weights that are more than __________ lb. 1. Lie on your abdomen with your legs straight. 2. Bend your left / right knee as far as you can without feeling pain. Keep your hips flat against the floor. 3. Hold this position for __________ seconds. 4. Slowly lower your leg to the starting position. Repeat __________ times. Complete this exercise __________ times a day.   Squats This exercise strengthens the muscles in front of your thigh and knee (quadriceps). 1. Stand in front of a table, with your feet and knees pointing straight ahead. You may rest your hands on the table for balance but not for support. 2. Slowly bend your knees and lower your hips like you are going to sit in a chair. ? Keep your weight over your heels, not over your toes. ? Keep your lower legs upright so they are parallel with the table legs. ? Do not let your hips go lower than your knees. ? Do not bend lower than told by your health care provider. ? If your knee pain increases, do not bend as low. 3. Hold the squat position for __________ seconds. 4. Slowly push with your legs to return to standing. Do not use your hands to pull yourself to standing. Repeat __________ times. Complete this exercise __________ times a day. Wall slides This exercise strengthens the muscles in front of your thigh and knee (quadriceps). 1. Lean your back against a smooth wall or door, and walk your feet out 18-24 inches (46-61 cm) from  it. 2. Place your feet hip-width apart. 3. Slowly slide down the wall or door until your knees bend __________ degrees. Keep your knees over your heels, not over your toes. Keep your knees in line with your hips. 4. Hold this position for __________ seconds. Repeat __________ times. Complete this exercise __________ times a day.   Straight leg raises This exercise strengthens the muscles that rotate the leg at the hip and move it away from your body (hip abductors). 1. Lie on  your side with your left / right leg in the top position. Lie so your head, shoulder, knee, and hip line up. You may bend your bottom knee to help you keep your balance. 2. Roll your hips slightly forward so your hips are stacked directly over each other and your left / right knee is facing forward. 3. Leading with your heel, lift your top leg 4-6 inches (10-15 cm). You should feel the muscles in your outer hip lifting. ? Do not let your foot drift forward. ? Do not let your knee roll toward the ceiling. 4. Hold this position for __________ seconds. 5. Slowly return your leg to the starting position. 6. Let your muscles relax completely after each repetition. Repeat __________ times. Complete this exercise __________ times a day.   Straight leg raises This exercise stretches the muscles that move your hips away from the front of the pelvis (hip extensors). 1. Lie on your abdomen on a firm surface. You can put a pillow under your hips if that is more comfortable. 2. Tense the muscles in your buttocks and lift your left / right leg about 4-6 inches (10-15 cm). Keep your knee straight as you lift your leg. 3. Hold this position for __________ seconds. 4. Slowly lower your leg to the starting position. 5. Let your leg relax completely after each repetition. Repeat __________ times. Complete this exercise __________ times a day. This information is not intended to replace advice given to you by your health care provider. Make  sure you discuss any questions you have with your health care provider. Document Revised: 07/23/2018 Document Reviewed: 07/23/2018 [employer] Patient Education  2021 [employer].

## 2021-01-06 LAB — COMP. METABOLIC PANEL (12)
AST: 10 IU/L (ref 0–40)
Albumin/Globulin Ratio: 1.8 (ref 1.2–2.2)
Albumin: 4.3 g/dL (ref 3.8–4.8)
Alkaline Phosphatase: 83 IU/L (ref 44–121)
BUN/Creatinine Ratio: 11 — ABNORMAL LOW (ref 12–28)
BUN: 9 mg/dL (ref 8–27)
Bilirubin Total: 0.2 mg/dL (ref 0.0–1.2)
Calcium: 9.4 mg/dL (ref 8.7–10.3)
Chloride: 108 mmol/L — ABNORMAL HIGH (ref 96–106)
Creatinine, Ser: 0.84 mg/dL (ref 0.57–1.00)
Globulin, Total: 2.4 g/dL (ref 1.5–4.5)
Glucose: 99 mg/dL (ref 65–99)
Potassium: 4.5 mmol/L (ref 3.5–5.2)
Sodium: 145 mmol/L — ABNORMAL HIGH (ref 134–144)
Total Protein: 6.7 g/dL (ref 6.0–8.5)
eGFR: 77 mL/min/{1.73_m2} (ref >59)

## 2021-01-06 LAB — LIPID PANEL
Chol/HDL Ratio: 2.6 ratio (ref 0.0–4.4)
Cholesterol, Total: 184 mg/dL (ref 100–199)
HDL: 70 mg/dL (ref >39)
LDL Chol Calc (NIH): 96 mg/dL (ref 0–99)
Triglycerides: 102 mg/dL (ref 0–149)
VLDL Cholesterol Cal: 18 mg/dL (ref 5–40)

## 2021-01-14 ENCOUNTER — Telehealth: Payer: Self-pay

## 2021-01-14 NOTE — Telephone Encounter (Signed)
-----   Message from Vevelyn Francois, NP sent at 01/14/2021  1:38 PM EDT ----- Overall labs are stable

## 2021-01-14 NOTE — Telephone Encounter (Signed)
Patient informed of results.  

## 2021-01-29 ENCOUNTER — Ambulatory Visit: Payer: BC Managed Care – PPO

## 2021-02-03 ENCOUNTER — Other Ambulatory Visit: Payer: BC Managed Care – PPO

## 2021-02-05 ENCOUNTER — Ambulatory Visit: Payer: BC Managed Care – PPO

## 2021-02-17 ENCOUNTER — Ambulatory Visit: Payer: BC Managed Care – PPO | Admitting: Nurse Practitioner

## 2021-02-17 ENCOUNTER — Other Ambulatory Visit: Payer: Self-pay

## 2021-02-17 ENCOUNTER — Encounter: Payer: Self-pay | Admitting: Nurse Practitioner

## 2021-02-17 VITALS — BP 157/87 | HR 71 | Temp 98.3°F | Ht 62.0 in | Wt 222.4 lb

## 2021-02-17 DIAGNOSIS — R7303 Prediabetes: Secondary | ICD-10-CM | POA: Diagnosis not present

## 2021-02-17 DIAGNOSIS — I10 Essential (primary) hypertension: Secondary | ICD-10-CM | POA: Diagnosis not present

## 2021-02-17 MED ORDER — BLOOD PRESSURE MONITOR AUTOMAT DEVI: 1.0000 | Freq: Every day | 0 refills | Status: DC

## 2021-02-17 NOTE — Patient Instructions (Addendum)
Healthy Eating Following a healthy eating pattern may help you to achieve and maintain a healthy body weight, reduce the risk of chronic disease, and live a long and productive life. It is important to follow a healthy eating pattern at an appropriate calorie level for your body. Your nutritional needs should be met primarily through food by choosing a variety of nutrient-rich foods. What are tips for following this plan? Reading food labels  Read labels and choose the following: ? Reduced or low sodium. ? Juices with 100% fruit juice. ? Foods with low saturated fats and high polyunsaturated and monounsaturated fats. ? Foods with whole grains, such as whole wheat, cracked wheat, brown rice, and wild rice. ? Whole grains that are fortified with folic acid. This is recommended for women who are pregnant or who want to become pregnant.  Read labels and avoid the following: ? Foods with a lot of added sugars. These include foods that contain brown sugar, corn sweetener, corn syrup, dextrose, fructose, glucose, high-fructose corn syrup, honey, invert sugar, lactose, malt syrup, maltose, molasses, raw sugar, sucrose, trehalose, or turbinado sugar.  Do not eat more than the following amounts of added sugar per day:  6 teaspoons (25 g) for women.  9 teaspoons (38 g) for men. ? Foods that contain processed or refined starches and grains. ? Refined grain products, such as white flour, degermed cornmeal, white bread, and white rice. Shopping  Choose nutrient-rich snacks, such as vegetables, whole fruits, and nuts. Avoid high-calorie and high-sugar snacks, such as potato chips, fruit snacks, and candy.  Use oil-based dressings and spreads on foods instead of solid fats such as butter, stick margarine, or cream cheese.  Limit pre-made sauces, mixes, and "instant" products such as flavored rice, instant noodles, and ready-made pasta.  Try more plant-protein sources, such as tofu, tempeh, black beans,  edamame, lentils, nuts, and seeds.  Explore eating plans such as the Mediterranean diet or vegetarian diet. Cooking  Use oil to saut or stir-fry foods instead of solid fats such as butter, stick margarine, or lard.  Try baking, boiling, grilling, or broiling instead of frying.  Remove the fatty part of meats before cooking.  Steam vegetables in water or broth. Meal planning  At meals, imagine dividing your plate into fourths: ? One-half of your plate is fruits and vegetables. ? One-fourth of your plate is whole grains. ? One-fourth of your plate is protein, especially lean meats, poultry, eggs, tofu, beans, or nuts.  Include low-fat dairy as part of your daily diet.   Lifestyle  Choose healthy options in all settings, including home, work, school, restaurants, or stores.  Prepare your food safely: ? Wash your hands after handling raw meats. ? Keep food preparation surfaces clean by regularly washing with hot, soapy water. ? Keep raw meats separate from ready-to-eat foods, such as fruits and vegetables. ? Cook seafood, meat, poultry, and eggs to the recommended internal temperature. ? Store foods at safe temperatures. In general:  Keep cold foods at 7F (4.4C) or below.  Keep hot foods at 17F (60C) or above.  Keep your freezer at [facility] (-17.8C) or below.  Foods are no longer safe to eat when they have been between the temperatures of 40-17F (4.4-60C) for more than 2 hours. What foods should I eat? Fruits Aim to eat 2 cup-equivalents of fresh, canned (in natural juice), or frozen fruits each day. Examples of 1 cup-equivalent of fruit include 1 small apple, 8 large strawberries, 1 cup canned fruit,  cup dried fruit, or 1 cup 100% juice. Vegetables Aim to eat 2-3 cup-equivalents of fresh and frozen vegetables each day, including different varieties and colors. Examples of 1 cup-equivalent of vegetables include 2 medium carrots, 2 cups raw, leafy greens, 1 cup chopped  vegetable (raw or cooked), or 1 medium baked potato. Grains Aim to eat 6 ounce-equivalents of whole grains each day. Examples of 1 ounce-equivalent of grains include 1 slice of bread, 1 cup ready-to-eat cereal, 3 cups popcorn, or  cup cooked rice, pasta, or cereal. Meats and other proteins Aim to eat 5-6 ounce-equivalents of protein each day. Examples of 1 ounce-equivalent of protein include 1 egg, 1/2 cup nuts or seeds, or 1 tablespoon (16 g) peanut butter. A cut of meat or fish that is the size of a deck of cards is about 3-4 ounce-equivalents.  Of the protein you eat each week, try to have at least 8 ounces come from seafood. This includes salmon, trout, herring, and anchovies. Dairy Aim to eat 3 cup-equivalents of fat-free or low-fat dairy each day. Examples of 1 cup-equivalent of dairy include 1 cup (240 mL) milk, 8 ounces (250 g) yogurt, 1 ounces (44 g) natural cheese, or 1 cup (240 mL) fortified soy milk. Fats and oils  Aim for about 5 teaspoons (21 g) per day. Choose monounsaturated fats, such as canola and olive oils, avocados, peanut butter, and most nuts, or polyunsaturated fats, such as sunflower, corn, and soybean oils, walnuts, pine nuts, sesame seeds, sunflower seeds, and flaxseed. Beverages  Aim for six 8-oz glasses of water per day. Limit coffee to three to five 8-oz cups per day.  Limit caffeinated beverages that have added calories, such as soda and energy drinks.  Limit alcohol intake to no more than 1 drink a day for nonpregnant women and 2 drinks a day for men. One drink equals 12 oz of beer (355 mL), 5 oz of wine (148 mL), or 1 oz of hard liquor (44 mL). Seasoning and other foods  Avoid adding excess amounts of salt to your foods. Try flavoring foods with herbs and spices instead of salt.  Avoid adding sugar to foods.  Try using oil-based dressings, sauces, and spreads instead of solid fats. This information is based on general [place] nutrition guidelines. For more  information, visit BuildDNA.es. Exact amounts may vary based on your nutrition needs. Summary  A healthy eating plan may help you to maintain a healthy weight, reduce the risk of chronic diseases, and stay active throughout your life.  Plan your meals. Make sure you eat the right portions of a variety of nutrient-rich foods.  Try baking, boiling, grilling, or broiling instead of frying.  Choose healthy options in all settings, including home, work, school, restaurants, or stores. This information is not intended to replace advice given to you by your health care provider. Make sure you discuss any questions you have with your health care provider. Document Revised: 01/14/2018 Document Reviewed: 01/14/2018 Elsevier Patient Education  2021 [place] for Nurse [employer]itioners, 15(4), [phone]. Retrieved July 22, 2018 from http://clinicalkey.com/nursing">  Knee Exercises Ask your health care provider which exercises are safe for you. Do exercises exactly as told by your health care provider and adjust them as directed. It is normal to feel mild stretching, pulling, tightness, or discomfort as you do these exercises. Stop right away if you feel sudden pain or your pain gets worse. Do not begin these exercises until told by your health care provider.  Stretching and range-of-motion exercises These exercises warm up your muscles and joints and improve the movement and flexibility of your knee. These exercises also help to relieve pain and swelling. Knee extension, prone 1. Lie on your abdomen (prone position) on a bed. 2. Place your left / right knee just beyond the edge of the surface so your knee is not on the bed. You can put a towel under your left / right thigh just above your kneecap for comfort. 3. Relax your leg muscles and allow gravity to straighten your knee (extension). You should feel a stretch behind your left / right knee. 4. Hold this position for __________  seconds. 5. Scoot up so your knee is supported between repetitions. Repeat __________ times. Complete this exercise __________ times a day. Knee flexion, active 1. Lie on your back with both legs straight. If this causes back discomfort, bend your left / right knee so your foot is flat on the floor. 2. Slowly slide your left / right heel back toward your buttocks. Stop when you feel a gentle stretch in the front of your knee or thigh (flexion). 3. Hold this position for __________ seconds. 4. Slowly slide your left / right heel back to the starting position. Repeat __________ times. Complete this exercise __________ times a day.   Quadriceps stretch, prone 1. Lie on your abdomen on a firm surface, such as a bed or padded floor. 2. Bend your left / right knee and hold your ankle. If you cannot reach your ankle or pant leg, loop a belt around your foot and grab the belt instead. 3. Gently pull your heel toward your buttocks. Your knee should not slide out to the side. You should feel a stretch in the front of your thigh and knee (quadriceps). 4. Hold this position for __________ seconds. Repeat __________ times. Complete this exercise __________ times a day.   Hamstring, supine 1. Lie on your back (supine position). 2. Loop a belt or towel over the ball of your left / right foot. The ball of your foot is on the walking surface, right under your toes. 3. Straighten your left / right knee and slowly pull on the belt to raise your leg until you feel a gentle stretch behind your knee (hamstring). ? Do not let your knee bend while you do this. ? Keep your other leg flat on the floor. 4. Hold this position for __________ seconds. Repeat __________ times. Complete this exercise __________ times a day. Strengthening exercises These exercises build strength and endurance in your knee. Endurance is the ability to use your muscles for a long time, even after they get tired. Quadriceps, isometric This  exercise stretches the muscles in front of your thigh (quadriceps) without moving your knee joint (isometric). 1. Lie on your back with your left / right leg extended and your other knee bent. Put a rolled towel or small pillow under your knee if told by your health care provider. 2. Slowly tense the muscles in the front of your left / right thigh. You should see your kneecap slide up toward your hip or see increased dimpling just above the knee. This motion will push the back of the knee toward the floor. 3. For __________ seconds, hold the muscle as tight as you can without increasing your pain. 4. Relax the muscles slowly and completely. Repeat __________ times. Complete this exercise __________ times a day.   Straight leg raises This exercise stretches the muscles in front of your  thigh (quadriceps) and the muscles that move your hips (hip flexors). 1. Lie on your back with your left / right leg extended and your other knee bent. 2. Tense the muscles in the front of your left / right thigh. You should see your kneecap slide up or see increased dimpling just above the knee. Your thigh may even shake a bit. 3. Keep these muscles tight as you raise your leg 4-6 inches (10-15 cm) off the floor. Do not let your knee bend. 4. Hold this position for __________ seconds. 5. Keep these muscles tense as you lower your leg. 6. Relax your muscles slowly and completely after each repetition. Repeat __________ times. Complete this exercise __________ times a day. Hamstring, isometric 1. Lie on your back on a firm surface. 2. Bend your left / right knee about __________ degrees. 3. Dig your left / right heel into the surface as if you are trying to pull it toward your buttocks. Tighten the muscles in the back of your thighs (hamstring) to "dig" as hard as you can without increasing any pain. 4. Hold this position for __________ seconds. 5. Release the tension gradually and allow your muscles to relax  completely for __________ seconds after each repetition. Repeat __________ times. Complete this exercise __________ times a day. Hamstring curls If told by your health care provider, do this exercise while wearing ankle weights. Begin with __________ lb weights. Then increase the weight by 1 lb (0.5 kg) increments. Do not wear ankle weights that are more than __________ lb. 1. Lie on your abdomen with your legs straight. 2. Bend your left / right knee as far as you can without feeling pain. Keep your hips flat against the floor. 3. Hold this position for __________ seconds. 4. Slowly lower your leg to the starting position. Repeat __________ times. Complete this exercise __________ times a day.   Squats This exercise strengthens the muscles in front of your thigh and knee (quadriceps). 1. Stand in front of a table, with your feet and knees pointing straight ahead. You may rest your hands on the table for balance but not for support. 2. Slowly bend your knees and lower your hips like you are going to sit in a chair. ? Keep your weight over your heels, not over your toes. ? Keep your lower legs upright so they are parallel with the table legs. ? Do not let your hips go lower than your knees. ? Do not bend lower than told by your health care provider. ? If your knee pain increases, do not bend as low. 3. Hold the squat position for __________ seconds. 4. Slowly push with your legs to return to standing. Do not use your hands to pull yourself to standing. Repeat __________ times. Complete this exercise __________ times a day. Wall slides This exercise strengthens the muscles in front of your thigh and knee (quadriceps). 1. Lean your back against a smooth wall or door, and walk your feet out 18-24 inches (46-61 cm) from it. 2. Place your feet hip-width apart. 3. Slowly slide down the wall or door until your knees bend __________ degrees. Keep your knees over your heels, not over your toes. Keep  your knees in line with your hips. 4. Hold this position for __________ seconds. Repeat __________ times. Complete this exercise __________ times a day.   Straight leg raises This exercise strengthens the muscles that rotate the leg at the hip and move it away from your body (hip abductors). 1.  Lie on your side with your left / right leg in the top position. Lie so your head, shoulder, knee, and hip line up. You may bend your bottom knee to help you keep your balance. 2. Roll your hips slightly forward so your hips are stacked directly over each other and your left / right knee is facing forward. 3. Leading with your heel, lift your top leg 4-6 inches (10-15 cm). You should feel the muscles in your outer hip lifting. ? Do not let your foot drift forward. ? Do not let your knee roll toward the ceiling. 4. Hold this position for __________ seconds. 5. Slowly return your leg to the starting position. 6. Let your muscles relax completely after each repetition. Repeat __________ times. Complete this exercise __________ times a day.   Straight leg raises This exercise stretches the muscles that move your hips away from the front of the pelvis (hip extensors). 1. Lie on your abdomen on a firm surface. You can put a pillow under your hips if that is more comfortable. 2. Tense the muscles in your buttocks and lift your left / right leg about 4-6 inches (10-15 cm). Keep your knee straight as you lift your leg. 3. Hold this position for __________ seconds. 4. Slowly lower your leg to the starting position. 5. Let your leg relax completely after each repetition. Repeat __________ times. Complete this exercise __________ times a day. This information is not intended to replace advice given to you by your health care provider. Make sure you discuss any questions you have with your health care provider. Document Revised: 07/23/2018 Document Reviewed: 07/23/2018 Elsevier Patient Education  2021 [place].   Pneumococcal Polysaccharide Vaccine (PPSV23): What You Need to Know 1. Why get vaccinated? Pneumococcal polysaccharide vaccine (PPSV23) can prevent pneumococcal disease. Pneumococcal disease refers to any illness caused by pneumococcal bacteria. These bacteria can cause many types of illnesses, including pneumonia, which is an infection of the lungs. Pneumococcal bacteria are one of the most common causes of pneumonia. Besides pneumonia, pneumococcal bacteria can also cause:  Ear infections  Sinus infections  Meningitis (infection of the tissue covering the brain and spinal cord)  Bacteremia (bloodstream infection) Anyone can get pneumococcal disease, but children under 30 years of age, people with certain medical conditions, adults 77 years or older, and cigarette smokers are at the highest risk. Most pneumococcal infections are mild. However, some can result in long-term problems, such as brain damage or hearing loss. Meningitis, bacteremia, and pneumonia caused by pneumococcal disease can be fatal. 2. PPSV23 PPSV23 protects against 23 types of bacteria that cause pneumococcal disease. PPSV23 is recommended for:  All adults 40 years or older,  Anyone 2 years or older with certain medical conditions that can lead to an increased risk for pneumococcal disease. Most people need only one dose of PPSV23. A second dose of PPSV23, and another type of pneumococcal vaccine called PCV13, are recommended for certain high-risk groups. Your health care provider can give you more information. People 65 years or older should get a dose of PPSV23 even if they have already gotten one or more doses of the vaccine before they turned 68. 3. Talk with your health care provider Tell your vaccine provider if the person getting the vaccine:  Has had an allergic reaction after a previous dose of PPSV23, or has any severe, life-threatening allergies. In some cases, your health care provider may decide  to postpone PPSV23 vaccination to a future visit. People with  minor illnesses, such as a cold, may be vaccinated. People who are moderately or severely ill should usually wait until they recover before getting PPSV23. Your health care provider can give you more information. 4. Risks of a vaccine reaction  Redness or pain where the shot is given, feeling tired, fever, or muscle aches can happen after PPSV23. People sometimes faint after medical procedures, including vaccination. Tell your provider if you feel dizzy or have vision changes or ringing in the ears. As with any medicine, there is a very remote chance of a vaccine causing a severe allergic reaction, other serious injury, or death. 5. What if there is a serious problem? An allergic reaction could occur after the vaccinated person leaves the clinic. If you see signs of a severe allergic reaction (hives, swelling of the face and throat, difficulty breathing, a fast heartbeat, dizziness, or weakness), call 9-1-1 and get the person to the nearest hospital. For other signs that concern you, call your health care provider. Adverse reactions should be reported to the Vaccine Adverse Event Reporting System (VAERS). Your health care provider will usually file this report, or you can do it yourself. Visit the VAERS website at www.vaers.SamedayNews.es or call (228)004-7334. VAERS is only for reporting reactions, and VAERS staff do not give medical advice. 6. How can I learn more?  Ask your health care provider.  Call your local or state health department.  Contact the Centers for Disease Control and Prevention (CDC): ? Call 985-747-2873 (1-800-CDC-INFO) or ? Visit CDC's website at http://hunter.com/ Vaccine Information Statement PPSV23 Vaccine (08/14/2018) This information is not intended to replace advice given to you by your health care provider. Make sure you discuss any questions you have with your health care provider. Document Revised:  06/04/2020 Document Reviewed: 06/04/2020 Elsevier Patient Education  [place].

## 2021-02-17 NOTE — Progress Notes (Signed)
[place], [place]  [address] Phone:  [phone]   Fax:  [phone]   Established Patient Office Visit  Subjective:  Patient ID: Doris Green, female    DOB: [birth year]  Age: 65 y.o. MRN: [id number]  CC:  Chief Complaint  Patient presents with  . Follow-up    6 week follow  up     HPI [name] presents for follow up. She  has a past medical history of Allergy, Arthritis, Depression, History of blood transfusion, Spider bite, and UTERINE FIBROID (12/13/2006).   She has retired and has been enjoying with gardening. S  Hypertension Patient is here for follow-up of elevated blood pressure. She is not exercising however has been active in yard work and is not adherent to a low-salt diet. She admits that she was eating ham. She has plans to get back to eating vegetables.  Blood pressure is not monitored at home. Cardiac symptoms: Occasional headache with dizziness. Patient denies chest pain, chest pressure/discomfort, claudication, dyspnea, exertional chest pressure/discomfort, fatigue, irregular heart beat, orthopnea, palpitations, paroxysmal nocturnal dyspnea, syncope and tachypnea. Cardiovascular risk factors: advanced age (older than 16 for men, 72 for women), hypertension, obesity (BMI >= 30 kg/m2) and smoking/ tobacco exposure. Use of agents associated with hypertension: none. History of target organ damage: none.  Ms. Pasqua continues to have difficulty with adherence to continue blood pressure regimen   Past Medical History:  Diagnosis Date  . Allergy   . Arthritis   . Depression   . History of blood transfusion   . Spider bite    2016 and 2017  . UTERINE FIBROID 12/13/2006    Past Surgical History:  Procedure Laterality Date  . SHOULDER SURGERY    . TUBAL LIGATION      Family History  Problem Relation Age of Onset  . Diabetes Mother   . Hypertension Mother   . Heart disease Mother   . Cancer Father    . Diabetes Sister   . Hypertension Sister   . Heart disease Sister   . Hypertension Brother   . Diabetes Brother   . Cancer Maternal Grandmother   . Colon cancer Neg Hx   . Colon polyps Neg Hx   . Esophageal cancer Neg Hx   . Stomach cancer Neg Hx   . Rectal cancer Neg Hx   . Breast cancer Neg Hx     Social History   Socioeconomic History  . Marital status: Single    Spouse name: Not on file  . Number of children: 3  . Years of education: GED  . Highest education level: Not on file  Occupational History  . Occupation: Custodian    Employer: [employer]  Tobacco Use  . Smoking status: Current Some Day Smoker    Packs/day: 0.25    Types: Cigarettes    Last attempt to quit: 10/19/2011    Years since quitting: 9.3  . Smokeless tobacco: Never Used  . Tobacco comment: 2-3  cigs// per day  Vaping Use  . Vaping Use: Never used  Substance and Sexual Activity  . Alcohol use: No  . Drug use: No  . Sexual activity: Not on file  Other Topics Concern  . Not on file  Social History Narrative  . Not on file   Social Determinants of Health   Financial Resource Strain: Not on file  Food Insecurity: Not on file  Transportation Needs: Not on file  Physical Activity: Not on file  Stress: Not on file  Social Connections: Not on file  Intimate Partner Violence: Not on file    Outpatient Medications Prior to Visit  Medication Sig Dispense Refill  . amLODipine (NORVASC) 2.5 MG tablet Take 1 tablet (2.5 mg total) by mouth daily. 30 tablet 11  . Ascorbic Acid (VITAMIN C) 1000 MG tablet Take 1,000 mg by mouth daily.    . cholecalciferol (VITAMIN D3) 25 MCG (1000 UT) tablet Take 1,000 Units by mouth daily.    . hydrochlorothiazide (HYDRODIURIL) 12.5 MG tablet Take 1 tablet (12.5 mg total) by mouth daily. TAKE 1 TABLET(12.5 MG) BY MOUTH DAILY 30 tablet 11  . loratadine (CLARITIN) 10 MG tablet Take 1 tablet (10 mg total) by mouth daily. 30 tablet 11  . Multiple Vitamins-Minerals  (WOMENS HAIR, SKIN & NAILS PO) Take by mouth.    . Omega-3 1000 MG CAPS Take by mouth.    Marland Kitchen OVER THE COUNTER MEDICATION Beet chew, Boisenberry Gold    . Zinc-Magnesium Aspart-Vit B6 (ZINC MAGNESIUM ASPARTATE PO) Take by mouth. Once a day    . meloxicam (MOBIC) 7.5 MG tablet TAKE 1 TABLET(7.5 MG) BY MOUTH DAILY (Patient not taking: Reported on 02/17/2021) 30 tablet 0  . Probiotic Product (PROBIOTIC ADVANCED PO) Take by mouth as needed. (Patient not taking: Reported on 02/17/2021)     No facility-administered medications prior to visit.    Allergies  Allergen Reactions  . Ibuprofen     Blood in stool    ROS Review of Systems  Neurological: Positive for dizziness (unsure if HTN or the allegeries ) and headaches.      Objective:    Physical Exam Constitutional:      Appearance: She is obese.  HENT:     Head: Normocephalic and atraumatic.     Nose: Nose normal.     Mouth/Throat:     Mouth: Mucous membranes are moist.  Cardiovascular:     Rate and Rhythm: Normal rate. Rhythm irregular.     Pulses: Normal pulses.     Heart sounds: Normal heart sounds.  Pulmonary:     Effort: Pulmonary effort is normal.     Breath sounds: Normal breath sounds.  Abdominal:     Palpations: Abdomen is soft.  Musculoskeletal:        General: Normal range of motion.     Cervical back: Normal range of motion.  Skin:    General: Skin is warm and dry.     Capillary Refill: Capillary refill takes less than 2 seconds.  Neurological:     General: No focal deficit present.     Mental Status: She is alert and oriented to person, place, and time.  Psychiatric:        Mood and Affect: Mood normal.        Behavior: Behavior normal.        Thought Content: Thought content normal.        Judgment: Judgment normal.     BP (!) 157/87 (BP Location: Left Arm, Patient Position: Sitting, Cuff Size: Normal) Comment: pt has been taken cold medication  Pulse 71   Temp 98.3 F (36.8 C) (Temporal)   Ht 5' 2"  (1.575 m)   Wt 222 lb 6.4 oz (100.9 kg)   SpO2 98%   BMI 40.68 kg/m  Wt Readings from Last 3 Encounters:  02/17/21 222 lb 6.4 oz (100.9 kg)  01/05/21 221 lb (100.2 kg)  04/23/20 225 lb 4 oz (  102.2 kg)     Health Maintenance Due  Topic Date Due  . PNA vac Low Risk Adult (1 of 2 - PCV13) Never done    There are no preventive care reminders to display for this patient.  Lab Results  Component Value Date   TSH 1.930 01/15/2020   Lab Results  Component Value Date   WBC 8.4 01/15/2020   HGB 12.9 01/15/2020   HCT 39.4 01/15/2020   MCV 80 01/15/2020   PLT 283 01/15/2020   Lab Results  Component Value Date   NA 145 (H) 01/05/2021   K 4.5 01/05/2021   CO2 23 08/12/2019   GLUCOSE 99 01/05/2021   BUN 9 01/05/2021   CREATININE 0.84 01/05/2021   BILITOT 0.2 01/05/2021   ALKPHOS 83 01/05/2021   AST 10 01/05/2021   ALT 15 08/07/2018   PROT 6.7 01/05/2021   ALBUMIN 4.3 01/05/2021   CALCIUM 9.4 01/05/2021   EGFR 77 01/05/2021   Lab Results  Component Value Date   CHOL 184 01/05/2021   Lab Results  Component Value Date   HDL 70 01/05/2021   Lab Results  Component Value Date   LDLCALC 96 01/05/2021   Lab Results  Component Value Date   TRIG 102 01/05/2021   Lab Results  Component Value Date   CHOLHDL 2.6 01/05/2021   Lab Results  Component Value Date   HGBA1C 5.7 (A) 08/12/2019      Assessment & Plan:   Problem List Items Addressed This Visit      Cardiovascular and Mediastinum   Essential hypertension - Primary Encouraged on going compliance with current medication regimen Encouraged home monitoring and recording BP <130/80 Eating a heart-healthy diet with less salt Encouraged regular physical activity  Recommend Weight loss   Relevant Orders   HgB A1c     Other   Morbid obesity, unspecified obesity type ([place]) Obesity with BMI and comorbidities as noted above.  Discussed proper diet (low fat, low sodium, high fiber) with patient.   Discussed  need for regular exercise (3 times per week, 20 minutes per session) with patient.    Prediabetes Consider home glucose monitoring Weight loss at least 5% of current body weight is can be achieved with lifestyle modification dietary changes and regular daily exercise Encourage blood pressure control goal <120/80 and maintaining total cholesterol <200 Follow-up every 3 to 6 months for reevaluation Education material provided       Meds ordered this encounter  Medications  . Blood Pressure Monitoring (BLOOD PRESSURE MONITOR AUTOMAT) DEVI    Sig: 1 kit by Does not apply route daily.    Dispense:  1 each    Refill:  0    Order Specific Question:   Supervising Provider    Answer:   Tresa Garter [id number]    Follow-up: Return in about 3 months (around 05/20/2021) for Follow up HTN [id number].    Vevelyn Francois, NP

## 2021-02-18 LAB — POCT GLYCOSYLATED HEMOGLOBIN (HGB A1C)
HbA1c POC (<> result, manual entry): 5.7 % (ref 4.0–5.6)
HbA1c, POC (controlled diabetic range): 5.7 % (ref 0.0–7.0)
HbA1c, POC (prediabetic range): 5.7 % (ref 5.7–6.4)
Hemoglobin A1C: 5.7 % — AB (ref 4.0–5.6)

## 2021-04-06 ENCOUNTER — Other Ambulatory Visit: Payer: Self-pay

## 2021-04-06 ENCOUNTER — Encounter: Payer: Self-pay | Admitting: Nurse Practitioner

## 2021-04-06 ENCOUNTER — Ambulatory Visit (INDEPENDENT_AMBULATORY_CARE_PROVIDER_SITE_OTHER): Payer: Medicare Other | Admitting: Nurse Practitioner

## 2021-04-06 VITALS — BP 145/87 | HR 79 | Temp 97.0°F | Ht 62.0 in | Wt 224.0 lb

## 2021-04-06 DIAGNOSIS — G8929 Other chronic pain: Secondary | ICD-10-CM | POA: Diagnosis not present

## 2021-04-06 DIAGNOSIS — H9203 Otalgia, bilateral: Secondary | ICD-10-CM

## 2021-04-06 DIAGNOSIS — M25511 Pain in right shoulder: Secondary | ICD-10-CM | POA: Diagnosis not present

## 2021-04-06 MED ORDER — CIPRO HC 0.2-1 % OT SUSP: 3.0000 [drp] | Freq: Two times a day (BID) | OTIC | 0 refills | Status: DC

## 2021-04-06 MED ORDER — MELOXICAM 7.5 MG PO TABS: 7.5000 mg | ORAL_TABLET | Freq: Every day | ORAL | 11 refills | Status: DC

## 2021-04-06 MED ORDER — NEOMYCIN-POLYMYXIN-HC 3.5-10000-1 OT SOLN: 3.0000 [drp] | Freq: Four times a day (QID) | OTIC | 0 refills | Status: DC

## 2021-04-06 NOTE — Patient Instructions (Signed)
Earache, Adult An earache, or ear pain, can be caused by many things, including: An infection. Ear wax buildup. Ear pressure. Something in the ear that should not be there (foreign body). A sore throat. Tooth problems. Jaw problems. Treatment of the earache will depend on the cause. If the cause is not clear or cannot be determined, you may need to watch your symptoms until your earache goes away or until a cause is found. Follow these instructions at home: Medicines Take or apply over-the-counter and prescription medicines only as told by your health care provider. If you were prescribed an antibiotic medicine, use it as told by your health care provider. Do not stop using the antibiotic even if you start to feel better. Do not put anything in your ear other than medicine that is prescribed by your health care provider. Managing pain If directed, apply heat to the affected area as often as told by your health care provider. Use the heat source that your health care provider recommends, such as a moist heat pack or a heating pad. Place a towel between your skin and the heat source. Leave the heat on for 20-30 minutes. Remove the heat if your skin turns bright red. This is especially important if you are unable to feel pain, heat, or cold. You may have a greater risk of getting burned. If directed, put ice on the affected area as often as told by your health care provider. To do this:   Put ice in a plastic bag. Place a towel between your skin and the bag. Leave the ice on for 20 minutes, 2-3 times a day. General instructions Pay attention to any changes in your symptoms. Try resting in an upright position instead of lying down. This may help to reduce pressure in your ear and relieve pain. Chew gum if it helps to relieve your ear pain. Treat any allergies as told by your health care provider. Drink enough fluid to keep your urine pale yellow. It is up to you to get the results of any  tests that were done. Ask your health care provider, or the department that is doing the tests, when your results will be ready. Keep all follow-up visits as told by your health care provider. This is important. Contact a health care provider if: Your pain does not improve within 2 days. Your earache gets worse. You have new symptoms. You have a fever. Get help right away if you: Have a severe headache. Have a stiff neck. Have trouble swallowing. Have redness or swelling behind your ear. Have fluid or blood coming from your ear. Have hearing loss. Feel dizzy. Summary An earache, or ear pain, can be caused by many things. Treatment of the earache will depend on the cause. Follow recommendations from your health care provider to treat your ear pain. If the cause is not clear or cannot be determined, you may need to watch your symptoms until your earache goes away or until a cause is found. Keep all follow-up visits as told by your health care provider. This is important. This information is not intended to replace advice given to you by your health care provider. Make sure you discuss any questions you have with your health care provider. Document Revised: 05/10/2019 Document Reviewed: 05/10/2019 Elsevier Patient Education  2022 Elsevier Inc.  

## 2021-04-06 NOTE — Progress Notes (Signed)
[place], [place]  [address] Phone:  [phone]   Fax:  [phone]   Acute Office Visit  Subjective:    Patient ID: Doris Green, female    DOB: 18-Jul-1956, 65 y.o.   MRN: [id number]  Chief Complaint  Patient presents with   Ear Pain    Left ear pain, feels balance is off.    Numbness    Fingertips on right hand .    HPI Patient is in today for acute visit. She  has a past medical history of Allergy, Arthritis, Depression, History of blood transfusion, Spider bite, and UTERINE FIBROID (12/13/2006).   Ear Pain Patient complains of bilateral ear pain. Symptoms have been present a few days. She also notes decreased hearing in the right ear, no drainage, and mild pain in the left ear. She does have a history of ear infections. She does not have a history of recent swimming. She has been shampooing in her shower. She has tried no medications  for her symptoms.   She is also for evaluation of right hand/finger tingling. Onset was sudden, not related to any specific activity. The tingling is moderate, worsens with movement, and is relieved by rest. There is no associated numbness, weakness in hands .  She reports past right shoulder problems. She reports that she was due to have surgery some years ago but failed to complete due to this being a workers compensation situation. So it was never completed. She has not used the Meloxicam in a few months. She has taken EC APAP.    Past Medical History:  Diagnosis Date   Allergy    Arthritis    Depression    History of blood transfusion    Spider bite    2016 and 2017   UTERINE FIBROID 12/13/2006    Past Surgical History:  Procedure Laterality Date   SHOULDER SURGERY     TUBAL LIGATION      Family History  Problem Relation Age of Onset   Diabetes Mother    Hypertension Mother    Heart disease Mother    Cancer Father    Diabetes Sister    Hypertension Sister    Heart disease  Sister    Hypertension Brother    Diabetes Brother    Cancer Maternal Grandmother    Colon cancer Neg Hx    Colon polyps Neg Hx    Esophageal cancer Neg Hx    Stomach cancer Neg Hx    Rectal cancer Neg Hx    Breast cancer Neg Hx     Social History   Socioeconomic History   Marital status: Single    Spouse name: Not on file   Number of children: 3   Years of education: GED   Highest education level: Not on file  Occupational History   Occupation: Custodian    Employer: [employer]  Tobacco Use   Smoking status: Some Days    Packs/day: 0.25    Pack years: 0.00    Types: Cigarettes    Last attempt to quit: 10/19/2011    Years since quitting: 9.4   Smokeless tobacco: Never   Tobacco comments:    2-3  cigs// per day  Vaping Use   Vaping Use: Never used  Substance and Sexual Activity   Alcohol use: No   Drug use: No   Sexual activity: Not on file  Other Topics Concern   Not on file  Social History Narrative   Not on file   Social Determinants of Health   Financial Resource Strain: Not on file  Food Insecurity: Not on file  Transportation Needs: Not on file  Physical Activity: Not on file  Stress: Not on file  Social Connections: Not on file  Intimate Partner Violence: Not on file    Outpatient Medications Prior to Visit  Medication Sig Dispense Refill   amLODipine (NORVASC) 2.5 MG tablet Take 1 tablet (2.5 mg total) by mouth daily. 30 tablet 11   Ascorbic Acid (VITAMIN C) 1000 MG tablet Take 1,000 mg by mouth daily.     Blood Pressure Monitoring (BLOOD PRESSURE MONITOR AUTOMAT) DEVI 1 kit by Does not apply route daily. 1 each 0   cholecalciferol (VITAMIN D3) 25 MCG (1000 UT) tablet Take 1,000 Units by mouth daily.     hydrochlorothiazide (HYDRODIURIL) 12.5 MG tablet Take 1 tablet (12.5 mg total) by mouth daily. TAKE 1 TABLET(12.5 MG) BY MOUTH DAILY 30 tablet 11   loratadine (CLARITIN) 10 MG tablet Take 1 tablet (10 mg total) by mouth daily. 30 tablet 11    Multiple Vitamins-Minerals (WOMENS HAIR, SKIN & NAILS PO) Take by mouth.     Omega-3 1000 MG CAPS Take by mouth.     OVER THE COUNTER MEDICATION Beet chew, Boisenberry Gold     Probiotic Product (PROBIOTIC ADVANCED PO) Take by mouth as needed.     Zinc-Magnesium Aspart-Vit B6 (ZINC MAGNESIUM ASPARTATE PO) Take by mouth. Once a day     meloxicam (MOBIC) 7.5 MG tablet TAKE 1 TABLET(7.5 MG) BY MOUTH DAILY 30 tablet 0   No facility-administered medications prior to visit.    Allergies  Allergen Reactions   Ibuprofen     Blood in stool    Review of Systems     Objective:    Physical Exam Constitutional:      Appearance: She is obese.  HENT:     Head: Normocephalic and atraumatic.     Nose: Nose normal.     Mouth/Throat:     Mouth: Mucous membranes are moist.  Cardiovascular:     Rate and Rhythm: Normal rate and regular rhythm.     Pulses: Normal pulses.     Heart sounds: Normal heart sounds.  Pulmonary:     Effort: Pulmonary effort is normal.  Abdominal:     Palpations: Abdomen is soft.  Musculoskeletal:        General: Tenderness present.     Right shoulder: Tenderness present. Decreased range of motion. Normal strength.     Cervical back: Normal range of motion.     Right lower leg: No edema.     Left lower leg: No edema.  Skin:    General: Skin is warm and dry.     Capillary Refill: Capillary refill takes less than 2 seconds.  Neurological:     General: No focal deficit present.     Mental Status: She is alert and oriented to person, place, and time.    BP (!) 145/87 (BP Location: Right Arm, Patient Position: Sitting)   Pulse 79   Temp (!) 97 F (36.1 C)   Ht 5' 2"  (1.575 m)   Wt 224 lb 0.2 oz (101.6 kg)   BMI 40.97 kg/m  Wt Readings from Last 3 Encounters:  04/06/21 224 lb 0.2 oz (101.6 kg)  02/17/21 222 lb 6.4 oz (100.9 kg)  01/05/21 221 lb (100.2 kg)    Health Maintenance Due  Topic Date  Due   COVID-19 Vaccine (1) Never done   Zoster Vaccines-  Shingrix (1 of 2) Never done   PNA vac Low Risk Adult (1 of 2 - PCV13) Never done    There are no preventive care reminders to display for this patient.   Lab Results  Component Value Date   TSH 1.930 01/15/2020   Lab Results  Component Value Date   WBC 8.4 01/15/2020   HGB 12.9 01/15/2020   HCT 39.4 01/15/2020   MCV 80 01/15/2020   PLT 283 01/15/2020   Lab Results  Component Value Date   NA 145 (H) 01/05/2021   K 4.5 01/05/2021   CO2 23 08/12/2019   GLUCOSE 99 01/05/2021   BUN 9 01/05/2021   CREATININE 0.84 01/05/2021   BILITOT 0.2 01/05/2021   ALKPHOS 83 01/05/2021   AST 10 01/05/2021   ALT 15 08/07/2018   PROT 6.7 01/05/2021   ALBUMIN 4.3 01/05/2021   CALCIUM 9.4 01/05/2021   EGFR 77 01/05/2021   Lab Results  Component Value Date   CHOL 184 01/05/2021   Lab Results  Component Value Date   HDL 70 01/05/2021   Lab Results  Component Value Date   LDLCALC 96 01/05/2021   Lab Results  Component Value Date   TRIG 102 01/05/2021   Lab Results  Component Value Date   CHOLHDL 2.6 01/05/2021   Lab Results  Component Value Date   HGBA1C 5.7 (A) 02/18/2021   HGBA1C 5.7 02/18/2021   HGBA1C 5.7 02/18/2021   HGBA1C 5.7 02/18/2021       Assessment & Plan:   Problem List Items Addressed This Visit   None Visit Diagnoses     Chronic right shoulder pain    -  Primary Physical therapy referral placed Meloxicam refilled   Relevant Medications   meloxicam (MOBIC) 7.5 MG tablet   Other Relevant Orders   Ambulatory referral to Physical Therapy   Ear pain, bilateral     We will trial eardrops to see if symptoms improve patient to notify with any changes or worsening in symptoms        Meds ordered this encounter  Medications   meloxicam (MOBIC) 7.5 MG tablet    Sig: Take 1 tablet (7.5 mg total) by mouth daily.    Dispense:  30 tablet    Refill:  11    Order Specific Question:   Supervising Provider    Answer:   Tresa Garter [[id number]]    DISCONTD: ciprofloxacin-hydrocortisone (CIPRO HC) OTIC suspension    Sig: Place 3 drops into both ears 2 (two) times daily.    Dispense:  10 mL    Refill:  0    Order Specific Question:   Supervising Provider    Answer:   Tresa Garter [[id number]]   neomycin-polymyxin-hydrocortisone (CORTISPORIN) OTIC solution    Sig: Place 3 drops into both ears 4 (four) times daily.    Dispense:  10 mL    Refill:  0    Order Specific Question:   Supervising Provider    Answer:   Tresa Garter [[id number]]     Vevelyn Francois, NP

## 2021-04-07 ENCOUNTER — Encounter: Payer: Self-pay | Admitting: Nurse Practitioner

## 2021-04-22 ENCOUNTER — Other Ambulatory Visit: Payer: Self-pay

## 2021-04-22 ENCOUNTER — Ambulatory Visit: Payer: Medicare Other | Attending: Nurse Practitioner

## 2021-04-22 DIAGNOSIS — M25611 Stiffness of right shoulder, not elsewhere classified: Secondary | ICD-10-CM | POA: Diagnosis present

## 2021-04-22 DIAGNOSIS — M6281 Muscle weakness (generalized): Secondary | ICD-10-CM | POA: Insufficient documentation

## 2021-04-24 NOTE — Therapy (Signed)
[place], [place], [address] Phone: [phone]   Fax:  [phone]  Physical Therapy Evaluation  Patient Details  Name: Doris Green MRN: [id number] Date of Birth: [birth year] Referring Provider (PT): Vevelyn Francois, NP   Encounter Date: 04/22/2021   PT End of Session - 04/24/21 1132     Visit Number 1    Number of Visits 17    Date for PT Re-Evaluation 06/17/21    Authorization Type MCR - FOTO 6th and 10th    PT Start Time 9892    PT Stop Time 1125    PT Time Calculation (min) 41 min    Activity Tolerance Patient tolerated treatment well    Behavior During Therapy [facility] for tasks assessed/performed             Past Medical History:  Diagnosis Date   Allergy    Arthritis    Depression    History of blood transfusion    Spider bite    2016 and 2017   UTERINE FIBROID 12/13/2006    Past Surgical History:  Procedure Laterality Date   SHOULDER SURGERY     TUBAL LIGATION      There were no vitals filed for this visit.    Subjective Assessment - 04/24/21 1130     Subjective Pt presents to PT with reports of chronic R shoulder pain and discomfort. She has had previous L shoulder surgery and has occasional pain in this shoulder. Pt notes that has had tingling sensation in the R tricep and middle two fingers in R hand. Pt just recently retired and notes most limitation with overhead reaching, dressing, and home ADLs.    Pertinent History chronic hx of non-traumatic R shoudler pain; revious RTC on L shoulder after work related injury    Limitations Lifting;House hold activities    Patient Stated Goals Pt would like to decrease R shoulder pain to improve comfort with home and community activities    Currently in Pain? Yes    Pain Score 3    10/10 at worst   Pain Location Shoulder    Pain Orientation Right    Pain Descriptors / Indicators Aching    Pain Type Chronic pain    Pain Onset  More than a month ago    Pain Frequency Intermittent    Aggravating Factors  reaching, dressing    Pain Relieving Factors medications, heat                [facility] PT Assessment - 04/24/21 0001       Assessment   Medical Diagnosis M25.511,G89.29 (ICD-10-CM) - Chronic right shoulder pain    Referring Provider (PT) Vevelyn Francois, NP    Hand Dominance Right      Precautions   Precautions None      Restrictions   Weight Bearing Restrictions No      Home Environment   Living Environment Private residence    Living Arrangements Children    Type of [place] to enter    Entrance Stairs-Number of Steps 6 Bells One level      Prior Function   Level of Independence Independent;Independent with basic ADLs    Vocation Retired      Observation/Other Assessments   Focus on Therapeutic Outcomes (FOTO)  55% function; 46% predicted      AROM  Right Shoulder Flexion 125 Degrees    Right Shoulder ABduction 125 Degrees    Right Shoulder Internal Rotation --   L4   Left Shoulder Flexion 135 Degrees    Left Shoulder ABduction 127 Degrees    Left Shoulder Internal Rotation --   L2     Strength   Right Shoulder Flexion 3/5    Right Shoulder ABduction 3/5    Right Shoulder Internal Rotation 3+/5    Right Shoulder External Rotation 3+/5    Left Shoulder Flexion 4/5    Left Shoulder ABduction 4/5    Left Shoulder Internal Rotation 4/5    Left Shoulder External Rotation 4/5      Palpation   Palpation comment TTP to R anterior shoulder, biceps tendon, R scubscapularis                        Objective measurements completed on examination: See above findings.                 PT Short Term Goals - 04/24/21 1136       PT SHORT TERM GOAL #1   Title Pt will be knowledge and compliant with initial HEP in order to improve comfort and carryover    Baseline initial HEP given    Time 3     Period Weeks    Status New    Target Date 05/15/21      PT SHORT TERM GOAL #2   Title Pt will self report R shoulder pain no greater than 7/10 at worst in order to improve comfort and function    Baseline 10/10    Time 3    Period Weeks    Status New    Target Date 05/15/21               PT Long Term Goals - 04/24/21 1137       PT LONG TERM GOAL #1   Title Pt will improve FOTO score to predicted value as a proxy for functional improvement    Baseline see flowsheet    Time 8    Period Weeks    Status New    Target Date 06/19/21      PT LONG TERM GOAL #2   Title Pt will improve R shoulder flex/abd AROM to no less than 150 degrees in order to improve functional ability    Baseline see flowsheet    Time 8    Period Weeks    Status New    Target Date 06/19/21      PT LONG TERM GOAL #3   Title Pt will be able to reach into cabinets not limited by pain for improved comfort and function    Baseline unable    Time 8    Period Weeks    Status New    Target Date 06/19/21      PT LONG TERM GOAL #4   Title Pt will self report R shoulder pain no greater than 4/10 at worst in order to improve comfort    Baseline 10/10    Time 8    Period Weeks    Status New    Target Date 06/19/21                    Plan - 04/24/21 1133     Clinical Impression Statement Pt is a pleasant 65 y/o F who presents to PT with reports of chronic R shoulder  pain and discomfort. Physical findings are consistent with subjective impression, as pt demonstrates decreased R shoulder strength and AROM. She would benefit from skilled PT services working on improving strength of RTC and periscapular muscles in order to improve functional ability and decrease pain. Will assess response to initial HEP and progress as able.    Personal Factors and Comorbidities Fitness;Past/Current Experience;Time since onset of injury/illness/exacerbation    Examination-Activity Limitations Bathing;Lift;Reach  Overhead;Carry    Examination-Participation Restrictions Yard Work;Community Activity    Stability/Clinical Decision Making Stable/Uncomplicated    Clinical Decision Making Low    Rehab Potential Good    PT Frequency 2x / week    PT Duration 8 weeks    PT Treatment/Interventions ADLs/Self Care Home Management;Cryotherapy;Moist Heat;Electrical Stimulation;Therapeutic activities;Therapeutic exercise;Neuromuscular re-education;Patient/family education;Manual techniques;Dry needling;Vasopneumatic Device;Taping    PT Next Visit Plan assess response to HEP, progress as able    PT Home Exercise Plan Access Code: [email or web address]    Consulted and Agree with Plan of Care Patient             Patient will benefit from skilled therapeutic intervention in order to improve the following deficits and impairments:  Decreased endurance, Decreased range of motion, Decreased mobility, Decreased strength, Impaired sensation, Impaired UE functional use, Pain  Visit Diagnosis: Muscle weakness (generalized) - Plan: PT plan of care cert/re-cert  Stiffness of right shoulder, not elsewhere classified - Plan: PT plan of care cert/re-cert     Problem List Patient Active Problem List   Diagnosis Date Noted   Morbid obesity, unspecified obesity type ([place]) 01/05/2021   Allergic rhinitis 08/16/2019   Chronic pain of right knee 08/16/2019   Prediabetes 08/16/2019   Essential hypertension 03/05/2018   Preventative health care 10/12/2011   OBESITY 08/31/2008   GEN OSTEOARTHROSIS INVOLVING MULTIPLE SITES 08/31/2008   DEPRESSION, MAJOR, RECURRENT 12/13/2006   Ward Chatters, PT, DPT 04/24/21 11:43 AM  [facility] [address] [place], [place], [address] Phone: [phone]   Fax:  [phone]  Name: Doris Green MRN: [id number] Date of Birth: Jan 09, 1956

## 2021-04-26 ENCOUNTER — Other Ambulatory Visit: Payer: Self-pay

## 2021-04-26 ENCOUNTER — Ambulatory Visit: Payer: Medicare Other

## 2021-04-26 DIAGNOSIS — M6281 Muscle weakness (generalized): Secondary | ICD-10-CM | POA: Diagnosis not present

## 2021-04-26 DIAGNOSIS — M25611 Stiffness of right shoulder, not elsewhere classified: Secondary | ICD-10-CM

## 2021-04-26 NOTE — Therapy (Signed)
[place], [place], [address] Phone: [phone]   Fax:  [phone]  Physical Therapy Treatment  Patient Details  Name: Doris Green MRN: [id number] Date of Birth: 09/24/1956 Referring Provider (PT): Vevelyn Francois, NP   Encounter Date: 04/26/2021   PT End of Session - 04/26/21 0947     Visit Number 2    Number of Visits 17    Date for PT Re-Evaluation 06/17/21    Authorization Type MCR - FOTO 6th and 10th    PT Start Time 0947    PT Stop Time 1028    PT Time Calculation (min) 41 min    Activity Tolerance Patient tolerated treatment well    Behavior During Therapy [facility] for tasks assessed/performed             Past Medical History:  Diagnosis Date   Allergy    Arthritis    Depression    History of blood transfusion    Spider bite    2016 and 2017   UTERINE FIBROID 12/13/2006    Past Surgical History:  Procedure Laterality Date   SHOULDER SURGERY     TUBAL LIGATION      There were no vitals filed for this visit.   Subjective Assessment - 04/26/21 0947     Subjective Pt presents to PT with continued reports of R shoulder pain. She thinks this is due to cutting bushes in front of her home this past weekend. Pt has been fairly compliant with her HEP with no adverse effect. Pt is ready to begin PT treatment at this time.    Currently in Pain? Yes    Pain Score 5     Pain Location Shoulder    Pain Orientation Right                               [facility] Adult PT Treatment/Exercise - 04/26/21 0001       Exercises   Exercises Shoulder      Shoulder Exercises: Supine   Protraction Limitations 2x10 3lbs ea      Shoulder Exercises: Seated   Horizontal ABduction Limitations 3x10 RTB    Other Seated Exercises seated bilat ER 3x10 RTB      Shoulder Exercises: Standing   External Rotation Limitations 2x10 - 5 sec hold    Internal Rotation Limitations 2x10 - 5  sec hold    Extension Limitations 3x10 GTB    Row Limitations 3x12 GTB      Shoulder Exercises: ROM/Strengthening   UBE (Upper Arm Bike) lvl 1.0 x 4 min fwd while taking subjective                      PT Short Term Goals - 04/24/21 1136       PT SHORT TERM GOAL #1   Title Pt will be knowledge and compliant with initial HEP in order to improve comfort and carryover    Baseline initial HEP given    Time 3    Period Weeks    Status New    Target Date 05/15/21      PT SHORT TERM GOAL #2   Title Pt will self report R shoulder pain no greater than 7/10 at worst in order to improve comfort and function    Baseline 10/10    Time 3    Period Weeks    Status  New    Target Date 05/15/21               PT Long Term Goals - 04/24/21 1137       PT LONG TERM GOAL #1   Title Pt will improve FOTO score to predicted value as a proxy for functional improvement    Baseline see flowsheet    Time 8    Period Weeks    Status New    Target Date 06/19/21      PT LONG TERM GOAL #2   Title Pt will improve R shoulder flex/abd AROM to no less than 150 degrees in order to improve functional ability    Baseline see flowsheet    Time 8    Period Weeks    Status New    Target Date 06/19/21      PT LONG TERM GOAL #3   Title Pt will be able to reach into cabinets not limited by pain for improved comfort and function    Baseline unable    Time 8    Period Weeks    Status New    Target Date 06/19/21      PT LONG TERM GOAL #4   Title Pt will self report R shoulder pain no greater than 4/10 at worst in order to improve comfort    Baseline 10/10    Time 8    Period Weeks    Status New    Target Date 06/19/21                   Plan - 04/26/21 1031     Clinical Impression Statement Pt was able to complete all prescribed exercises with no adverse effect or increase in pain. She responded well to continued progression of periscapular strengthening, noting decreaesd  pain post session. Pt continues to benefit from skilled PT services, working on increasing strength and functional ability. Will continue to progress as able.    PT Treatment/Interventions ADLs/Self Care Home Management;Cryotherapy;Moist Heat;Electrical Stimulation;Therapeutic activities;Therapeutic exercise;Neuromuscular re-education;Patient/family education;Manual techniques;Dry needling;Vasopneumatic Device;Taping    PT Next Visit Plan progress periscapular and RTC strengthening as tolerated    PT Home Exercise Plan Access Code: [email or web address]             Patient will benefit from skilled therapeutic intervention in order to improve the following deficits and impairments:  Decreased endurance, Decreased range of motion, Decreased mobility, Decreased strength, Impaired sensation, Impaired UE functional use, Pain  Visit Diagnosis: Muscle weakness (generalized)  Stiffness of right shoulder, not elsewhere classified     Problem List Patient Active Problem List   Diagnosis Date Noted   Morbid obesity, unspecified obesity type ([place]) 01/05/2021   Allergic rhinitis 08/16/2019   Chronic pain of right knee 08/16/2019   Prediabetes 08/16/2019   Essential hypertension 03/05/2018   Preventative health care 10/12/2011   OBESITY 08/31/2008   GEN OSTEOARTHROSIS INVOLVING MULTIPLE SITES 08/31/2008   DEPRESSION, MAJOR, RECURRENT 12/13/2006    Ward Chatters, PT, DPT 04/26/21 10:33 AM  [facility] [address] [place], [place], [address] Phone: [phone]   Fax:  [phone]  Name: Doris Green MRN: [id number] Date of Birth: [birth year]

## 2021-04-28 ENCOUNTER — Other Ambulatory Visit: Payer: Self-pay

## 2021-04-28 ENCOUNTER — Ambulatory Visit: Payer: Medicare Other

## 2021-04-28 DIAGNOSIS — M6281 Muscle weakness (generalized): Secondary | ICD-10-CM | POA: Diagnosis not present

## 2021-04-28 DIAGNOSIS — M25611 Stiffness of right shoulder, not elsewhere classified: Secondary | ICD-10-CM

## 2021-04-28 NOTE — Therapy (Signed)
[place], [place], [address] Phone: [phone]   Fax:  [phone]  Physical Therapy Treatment  Patient Details  Name: Doris Green MRN: [id number] Date of Birth: 07/23/1956 Referring Provider (PT): Vevelyn Francois, NP   Encounter Date: 04/28/2021   PT End of Session - 04/28/21 0907     Visit Number 3    Number of Visits 17    Date for PT Re-Evaluation 06/17/21    Authorization Type MCR - FOTO 6th and 10th    PT Start Time 0910    PT Stop Time 0950    PT Time Calculation (min) 40 min    Activity Tolerance Patient tolerated treatment well    Behavior During Therapy [facility] for tasks assessed/performed             Past Medical History:  Diagnosis Date   Allergy    Arthritis    Depression    History of blood transfusion    Spider bite    2016 and 2017   UTERINE FIBROID 12/13/2006    Past Surgical History:  Procedure Laterality Date   SHOULDER SURGERY     TUBAL LIGATION      There were no vitals filed for this visit.   Subjective Assessment - 04/28/21 0908     Subjective Pt presents to PT with no current reports of R shoulder pain. She had some muscle soreness after last session, but it dissapated quickly. Pt is ready to begin PT treatment at this time.    Currently in Pain? No/denies    Pain Score 0-No pain                               [facility] Adult PT Treatment/Exercise - 04/28/21 0001       Shoulder Exercises: Supine   Protraction Limitations 2x10 3lbs ea      Shoulder Exercises: Seated   Horizontal ABduction Limitations 2x15 GTB      Shoulder Exercises: Sidelying   External Rotation Limitations 2x10 ea      Shoulder Exercises: Standing   External Rotation Limitations 2x10 red tband ea    Internal Rotation Limitations 2x10 red tband ea    Extension Limitations 2x10 GTB    Row Limitations 3x15 GTB      Shoulder Exercises: ROM/Strengthening   UBE  (Upper Arm Bike) lvl 1.0 x 4 min fwd/bwd while taking subjective      Shoulder Exercises: Stretch   Corner Stretch 30 seconds                    PT Education - 04/28/21 0952     Education Details HEP update    Person(s) Educated Patient    Methods Explanation;Demonstration;Handout    Comprehension Verbalized understanding;Returned demonstration              PT Short Term Goals - 04/24/21 1136       PT SHORT TERM GOAL #1   Title Pt will be knowledge and compliant with initial HEP in order to improve comfort and carryover    Baseline initial HEP given    Time 3    Period Weeks    Status New    Target Date 05/15/21      PT SHORT TERM GOAL #2   Title Pt will self report R shoulder pain no greater than 7/10 at worst in order to improve comfort  and function    Baseline 10/10    Time 3    Period Weeks    Status New    Target Date 05/15/21               PT Long Term Goals - 04/24/21 1137       PT LONG TERM GOAL #1   Title Pt will improve FOTO score to predicted value as a proxy for functional improvement    Baseline see flowsheet    Time 8    Period Weeks    Status New    Target Date 06/19/21      PT LONG TERM GOAL #2   Title Pt will improve R shoulder flex/abd AROM to no less than 150 degrees in order to improve functional ability    Baseline see flowsheet    Time 8    Period Weeks    Status New    Target Date 06/19/21      PT LONG TERM GOAL #3   Title Pt will be able to reach into cabinets not limited by pain for improved comfort and function    Baseline unable    Time 8    Period Weeks    Status New    Target Date 06/19/21      PT LONG TERM GOAL #4   Title Pt will self report R shoulder pain no greater than 4/10 at worst in order to improve comfort    Baseline 10/10    Time 8    Period Weeks    Status New    Target Date 06/19/21                   Plan - 04/28/21 0952     Clinical Impression Statement Pt was once again  able to complete prescribed exercises with no advers effect. HEP updated for increased isotonic strengthening of RTC musculature. Pt responded well to corner chest stretch, decreased N/T noted. Continues to progress well and should continue to be seen per POC as prescribed.    PT Treatment/Interventions ADLs/Self Care Home Management;Cryotherapy;Moist Heat;Electrical Stimulation;Therapeutic activities;Therapeutic exercise;Neuromuscular re-education;Patient/family education;Manual techniques;Dry needling;Vasopneumatic Device;Taping    PT Next Visit Plan progress periscapular and RTC strengthening as tolerated    PT Home Exercise Plan Access Code: [email or web address]             Patient will benefit from skilled therapeutic intervention in order to improve the following deficits and impairments:  Decreased endurance, Decreased range of motion, Decreased mobility, Decreased strength, Impaired sensation, Impaired UE functional use, Pain  Visit Diagnosis: Muscle weakness (generalized)  Stiffness of right shoulder, not elsewhere classified     Problem List Patient Active Problem List   Diagnosis Date Noted   Morbid obesity, unspecified obesity type ([place]) 01/05/2021   Allergic rhinitis 08/16/2019   Chronic pain of right knee 08/16/2019   Prediabetes 08/16/2019   Essential hypertension 03/05/2018   Preventative health care 10/12/2011   OBESITY 08/31/2008   GEN OSTEOARTHROSIS INVOLVING MULTIPLE SITES 08/31/2008   DEPRESSION, MAJOR, RECURRENT 12/13/2006   Ward Chatters, PT, DPT 04/28/21 9:54 AM  [facility] [address] [place], [place], [address] Phone: [phone]   Fax:  [phone]  Name: Doris Green MRN: [id number] Date of Birth: 1956/09/21

## 2021-05-03 ENCOUNTER — Other Ambulatory Visit: Payer: Self-pay

## 2021-05-03 ENCOUNTER — Ambulatory Visit: Payer: Medicare Other

## 2021-05-03 DIAGNOSIS — M6281 Muscle weakness (generalized): Secondary | ICD-10-CM | POA: Diagnosis not present

## 2021-05-03 DIAGNOSIS — M25611 Stiffness of right shoulder, not elsewhere classified: Secondary | ICD-10-CM

## 2021-05-03 NOTE — Therapy (Signed)
[place], [place], [address] Phone: [phone]   Fax:  [phone]  Physical Therapy Treatment  Patient Details  Name: Doris Green MRN: [id number] Date of Birth: Aug 12, 1956 Referring Provider (PT): Vevelyn Francois, NP   Encounter Date: 05/03/2021   PT End of Session - 05/03/21 1131     Visit Number 4    Number of Visits 17    Date for PT Re-Evaluation 06/17/21    Authorization Type MCR - FOTO 6th and 10th    PT Start Time 1131    PT Stop Time 1215    PT Time Calculation (min) 44 min    Activity Tolerance Patient tolerated treatment well    Behavior During Therapy [facility] for tasks assessed/performed             Past Medical History:  Diagnosis Date   Allergy    Arthritis    Depression    History of blood transfusion    Spider bite    2016 and 2017   UTERINE FIBROID 12/13/2006    Past Surgical History:  Procedure Laterality Date   SHOULDER SURGERY     TUBAL LIGATION      There were no vitals filed for this visit.   Subjective Assessment - 05/03/21 1131     Subjective Pt presents to PT with reports of slight R shoulder pain. She has continued to be compliant with her HEP with no adverse effect. Pt is ready to begin PT treatment at this time.    Currently in Pain? Yes    Pain Score 2     Pain Location Shoulder    Pain Orientation Right                               [facility] Adult PT Treatment/Exercise - 05/03/21 0001       Shoulder Exercises: Seated   Horizontal ABduction Limitations 2x10 GTB    Other Seated Exercises seated bilat ER 3x10 GTB      Shoulder Exercises: Standing   External Rotation Limitations 2x10 red tband ea    Internal Rotation Limitations 2x10 red tband ea    Extension Limitations 2x10 black tband    Row Limitations 2x15 black tband      Shoulder Exercises: ROM/Strengthening   Nustep lvl 5 x 5 min UE/LE while taking subjective       Shoulder Exercises: Stretch   Corner Stretch 2 reps;30 seconds    Other Shoulder Stretches ball rolls up wall 2x10 red physioball                      PT Short Term Goals - 04/24/21 1136       PT SHORT TERM GOAL #1   Title Pt will be knowledge and compliant with initial HEP in order to improve comfort and carryover    Baseline initial HEP given    Time 3    Period Weeks    Status New    Target Date 05/15/21      PT SHORT TERM GOAL #2   Title Pt will self report R shoulder pain no greater than 7/10 at worst in order to improve comfort and function    Baseline 10/10    Time 3    Period Weeks    Status New    Target Date 05/15/21  PT Long Term Goals - 04/24/21 1137       PT LONG TERM GOAL #1   Title Pt will improve FOTO score to predicted value as a proxy for functional improvement    Baseline see flowsheet    Time 8    Period Weeks    Status New    Target Date 06/19/21      PT LONG TERM GOAL #2   Title Pt will improve R shoulder flex/abd AROM to no less than 150 degrees in order to improve functional ability    Baseline see flowsheet    Time 8    Period Weeks    Status New    Target Date 06/19/21      PT LONG TERM GOAL #3   Title Pt will be able to reach into cabinets not limited by pain for improved comfort and function    Baseline unable    Time 8    Period Weeks    Status New    Target Date 06/19/21      PT LONG TERM GOAL #4   Title Pt will self report R shoulder pain no greater than 4/10 at worst in order to improve comfort    Baseline 10/10    Time 8    Period Weeks    Status New    Target Date 06/19/21                   Plan - 05/03/21 1212     Clinical Impression Statement Pt was able to complete prescribed exercises with no adverse effect. She continues to progress well with therapy, with today's session focusing on continued strengthening of RTC and scapular stabilizers. Will continue to progress  exercises as tolerated per POC.    PT Treatment/Interventions ADLs/Self Care Home Management;Cryotherapy;Moist Heat;Electrical Stimulation;Therapeutic activities;Therapeutic exercise;Neuromuscular re-education;Patient/family education;Manual techniques;Dry needling;Vasopneumatic Device;Taping    PT Next Visit Plan progress periscapular and RTC strengthening as tolerated    PT Home Exercise Plan Access Code: [email or web address]             Patient will benefit from skilled therapeutic intervention in order to improve the following deficits and impairments:  Decreased endurance, Decreased range of motion, Decreased mobility, Decreased strength, Impaired sensation, Impaired UE functional use, Pain  Visit Diagnosis: Muscle weakness (generalized)  Stiffness of right shoulder, not elsewhere classified     Problem List Patient Active Problem List   Diagnosis Date Noted   Morbid obesity, unspecified obesity type ([place]) 01/05/2021   Allergic rhinitis 08/16/2019   Chronic pain of right knee 08/16/2019   Prediabetes 08/16/2019   Essential hypertension 03/05/2018   Preventative health care 10/12/2011   OBESITY 08/31/2008   GEN OSTEOARTHROSIS INVOLVING MULTIPLE SITES 08/31/2008   DEPRESSION, MAJOR, RECURRENT 12/13/2006    Ward Chatters, PT, DPT 05/03/21 12:16 PM  [facility] [address] [place], [place], [address] Phone: [phone]   Fax:  [phone]  Name: Doris Green MRN: [id number] Date of Birth: 20-Oct-1955

## 2021-05-05 ENCOUNTER — Other Ambulatory Visit: Payer: Self-pay

## 2021-05-05 ENCOUNTER — Ambulatory Visit: Payer: Medicare Other

## 2021-05-05 DIAGNOSIS — M6281 Muscle weakness (generalized): Secondary | ICD-10-CM | POA: Diagnosis not present

## 2021-05-05 DIAGNOSIS — M25611 Stiffness of right shoulder, not elsewhere classified: Secondary | ICD-10-CM

## 2021-05-05 NOTE — Therapy (Signed)
[place], [place], [address] Phone: [phone]   Fax:  [phone]  Physical Therapy Treatment  Patient Details  Name: Doris Green MRN: [id number] Date of Birth: 05-23-1956 Referring Provider (PT): Vevelyn Francois, NP   Encounter Date: 05/05/2021   PT End of Session - 05/05/21 1201     Visit Number 5    Number of Visits 17    Date for PT Re-Evaluation 06/17/21    Authorization Type MCR - FOTO 6th and 10th    PT Start Time 1205    PT Stop Time 8242    PT Time Calculation (min) 40 min    Activity Tolerance Patient tolerated treatment well    Behavior During Therapy [facility] for tasks assessed/performed             Past Medical History:  Diagnosis Date   Allergy    Arthritis    Depression    History of blood transfusion    Spider bite    2016 and 2017   UTERINE FIBROID 12/13/2006    Past Surgical History:  Procedure Laterality Date   SHOULDER SURGERY     TUBAL LIGATION      There were no vitals filed for this visit.   Subjective Assessment - 05/05/21 1202     Subjective Pt presents to PT with no current reports of shoulder pain or discomfort. She continues with HEP with no adverse effect. Pt is ready to begin PT treatment at this time.    Currently in Pain? No/denies    Pain Score 0-No pain                               [facility] Adult PT Treatment/Exercise - 05/05/21 0001       Shoulder Exercises: Standing   External Rotation Limitations 2x10 red tband ea    Internal Rotation Limitations 2x10 red tband ea    Extension Limitations 2x10 black tband w/ scap retraction    Row Limitations 3x15 black tband    Other Standing Exercises circles with small exercise ball 2x20 sec ea      Shoulder Exercises: ROM/Strengthening   UBE (Upper Arm Bike) lvl 1.0 x 4 min fwd/bwd while taking subjective      Shoulder Exercises: Stretch   Corner Stretch 3 reps;30 seconds     Other Shoulder Stretches ball rolls up wall 2x10 red physioball                      PT Short Term Goals - 05/05/21 1243       PT SHORT TERM GOAL #1   Title Pt will be knowledge and compliant with initial HEP in order to improve comfort and carryover    Baseline initial HEP given    Time 3    Period Weeks    Status Achieved    Target Date 05/15/21      PT SHORT TERM GOAL #2   Title Pt will self report R shoulder pain no greater than 7/10 at worst in order to improve comfort and function    Baseline 10/10    Time 3    Period Weeks    Status Achieved    Target Date 05/15/21               PT Long Term Goals - 04/24/21 1137       PT LONG  TERM GOAL #1   Title Pt will improve FOTO score to predicted value as a proxy for functional improvement    Baseline see flowsheet    Time 8    Period Weeks    Status New    Target Date 06/19/21      PT LONG TERM GOAL #2   Title Pt will improve R shoulder flex/abd AROM to no less than 150 degrees in order to improve functional ability    Baseline see flowsheet    Time 8    Period Weeks    Status New    Target Date 06/19/21      PT LONG TERM GOAL #3   Title Pt will be able to reach into cabinets not limited by pain for improved comfort and function    Baseline unable    Time 8    Period Weeks    Status New    Target Date 06/19/21      PT LONG TERM GOAL #4   Title Pt will self report R shoulder pain no greater than 4/10 at worst in order to improve comfort    Baseline 10/10    Time 8    Period Weeks    Status New    Target Date 06/19/21                   Plan - 05/05/21 1228     Clinical Impression Statement Pt again was able to complete prescribed exercises with no adverse effect. Today's session focused on continuing to increase proximal shoulder and periscapular muscle strength and stability. She continues to progress well with therapy, with improving strength and activity tolerance. PT will  continue to progress exercises as able pe rPOC as prescribed.    PT Treatment/Interventions ADLs/Self Care Home Management;Cryotherapy;Moist Heat;Electrical Stimulation;Therapeutic activities;Therapeutic exercise;Neuromuscular re-education;Patient/family education;Manual techniques;Dry needling;Vasopneumatic Device;Taping    PT Next Visit Plan progress periscapular and RTC strengthening as tolerated    PT Home Exercise Plan Access Code: [email or web address]             Patient will benefit from skilled therapeutic intervention in order to improve the following deficits and impairments:  Decreased endurance, Decreased range of motion, Decreased mobility, Decreased strength, Impaired sensation, Impaired UE functional use, Pain  Visit Diagnosis: Muscle weakness (generalized)  Stiffness of right shoulder, not elsewhere classified     Problem List Patient Active Problem List   Diagnosis Date Noted   Morbid obesity, unspecified obesity type ([place]) 01/05/2021   Allergic rhinitis 08/16/2019   Chronic pain of right knee 08/16/2019   Prediabetes 08/16/2019   Essential hypertension 03/05/2018   Preventative health care 10/12/2011   OBESITY 08/31/2008   GEN OSTEOARTHROSIS INVOLVING MULTIPLE SITES 08/31/2008   DEPRESSION, MAJOR, RECURRENT 12/13/2006    Ward Chatters, PT, DPT 05/05/21 12:46 PM  [facility] [address] [place], [place], [address] Phone: [phone]   Fax:  [phone]  Name: Justise Ehmann MRN: [id number] Date of Birth: August 02, 1956

## 2021-05-10 ENCOUNTER — Other Ambulatory Visit: Payer: Self-pay

## 2021-05-10 ENCOUNTER — Ambulatory Visit: Payer: Medicare Other

## 2021-05-10 DIAGNOSIS — M6281 Muscle weakness (generalized): Secondary | ICD-10-CM

## 2021-05-10 DIAGNOSIS — M25611 Stiffness of right shoulder, not elsewhere classified: Secondary | ICD-10-CM

## 2021-05-10 NOTE — Therapy (Signed)
[place], [place], [address] Phone: [phone]   Fax:  [phone]  Physical Therapy Treatment  Patient Details  Name: Doris Green MRN: [id number] Date of Birth: [birth year] Referring Provider (PT): Vevelyn Francois, NP   Encounter Date: 05/10/2021   PT End of Session - 05/10/21 1044     Visit Number 6    Number of Visits 17    Date for PT Re-Evaluation 06/17/21    Authorization Type MCR - FOTO 6th and 10th    PT Start Time [id number]    PT Stop Time 1125    PT Time Calculation (min) 40 min    Activity Tolerance Patient tolerated treatment well    Behavior During Therapy [facility] for tasks assessed/performed             Past Medical History:  Diagnosis Date   Allergy    Arthritis    Depression    History of blood transfusion    Spider bite    2016 and 2017   UTERINE FIBROID 12/13/2006    Past Surgical History:  Procedure Laterality Date   SHOULDER SURGERY     TUBAL LIGATION      There were no vitals filed for this visit.   Subjective Assessment - 05/10/21 1045     Subjective Pt presents to PT with no current reports of shoulder pain. She has continued to be compliant with HEP with no adverse effect. Pt is ready to begin PT treatment at this time.    Currently in Pain? No/denies    Pain Score 0-No pain                               [facility] Adult PT Treatment/Exercise - 05/10/21 0001       Shoulder Exercises: Seated   Horizontal ABduction Limitations 2x10 GTB    Other Seated Exercises seated bilat ER 3x10 GTB      Shoulder Exercises: Standing   External Rotation Limitations 2x10 7lbs ea    Internal Rotation Limitations 2x10 7lbs ea    Row Limitations 3x15 black tband    Other Standing Exercises circles 2lb medicine ball x 20 cw/ccw ea    Other Standing Exercises farmer's carry 7lb ea hand x 39f      Shoulder Exercises: ROM/Strengthening   Nustep lvl 5 x 5  min UE/LE while taking subjective      Shoulder Exercises: Stretch   Other Shoulder Stretches ball rolls up wall 2x10 red physioball                      PT Short Term Goals - 05/05/21 1243       PT SHORT TERM GOAL #1   Title Pt will be knowledge and compliant with initial HEP in order to improve comfort and carryover    Baseline initial HEP given    Time 3    Period Weeks    Status Achieved    Target Date 05/15/21      PT SHORT TERM GOAL #2   Title Pt will self report R shoulder pain no greater than 7/10 at worst in order to improve comfort and function    Baseline 10/10    Time 3    Period Weeks    Status Achieved    Target Date 05/15/21  PT Long Term Goals - 04/24/21 1137       PT LONG TERM GOAL #1   Title Pt will improve FOTO score to predicted value as a proxy for functional improvement    Baseline see flowsheet    Time 8    Period Weeks    Status New    Target Date 06/19/21      PT LONG TERM GOAL #2   Title Pt will improve R shoulder flex/abd AROM to no less than 150 degrees in order to improve functional ability    Baseline see flowsheet    Time 8    Period Weeks    Status New    Target Date 06/19/21      PT LONG TERM GOAL #3   Title Pt will be able to reach into cabinets not limited by pain for improved comfort and function    Baseline unable    Time 8    Period Weeks    Status New    Target Date 06/19/21      PT LONG TERM GOAL #4   Title Pt will self report R shoulder pain no greater than 4/10 at worst in order to improve comfort    Baseline 10/10    Time 8    Period Weeks    Status New    Target Date 06/19/21                   Plan - 05/10/21 1100     Clinical Impression Statement Pt was once again able to complete prescribed exercises. Today's session again focused on proximal shoulder and periscapular muscle strengthening exercises which pt continues to tolerated well. She continues to benefit from  skilled PT services and will continue to be seen and progressed as able.    PT Treatment/Interventions ADLs/Self Care Home Management;Cryotherapy;Moist Heat;Electrical Stimulation;Therapeutic activities;Therapeutic exercise;Neuromuscular re-education;Patient/family education;Manual techniques;Dry needling;Vasopneumatic Device;Taping    PT Next Visit Plan progress periscapular and RTC strengthening as tolerated    PT Home Exercise Plan Access Code: [email or web address]             Patient will benefit from skilled therapeutic intervention in order to improve the following deficits and impairments:  Decreased endurance, Decreased range of motion, Decreased mobility, Decreased strength, Impaired sensation, Impaired UE functional use, Pain  Visit Diagnosis: Muscle weakness (generalized)  Stiffness of right shoulder, not elsewhere classified     Problem List Patient Active Problem List   Diagnosis Date Noted   Morbid obesity, unspecified obesity type ([place]) 01/05/2021   Allergic rhinitis 08/16/2019   Chronic pain of right knee 08/16/2019   Prediabetes 08/16/2019   Essential hypertension 03/05/2018   Preventative health care 10/12/2011   OBESITY 08/31/2008   GEN OSTEOARTHROSIS INVOLVING MULTIPLE SITES 08/31/2008   DEPRESSION, MAJOR, RECURRENT 12/13/2006    Ward Chatters, PT, DPT 05/10/21 11:26 AM  [facility] [address] [place], [place], [address] Phone: [phone]   Fax:  [phone]  Name: Cathey Battle MRN: [id number] Date of Birth: 1955/11/22

## 2021-05-12 ENCOUNTER — Ambulatory Visit: Payer: Medicare Other

## 2021-05-12 ENCOUNTER — Other Ambulatory Visit: Payer: Self-pay

## 2021-05-12 DIAGNOSIS — M6281 Muscle weakness (generalized): Secondary | ICD-10-CM

## 2021-05-12 DIAGNOSIS — M25611 Stiffness of right shoulder, not elsewhere classified: Secondary | ICD-10-CM

## 2021-05-12 NOTE — Therapy (Signed)
[place], [place], [address] Phone: [phone]   Fax:  [phone]  Physical Therapy Treatment  Patient Details  Name: Doris Green MRN: [id number] Date of Birth: [birth year] Referring Provider (PT): Vevelyn Francois, NP   Encounter Date: 05/12/2021   PT End of Session - 05/12/21 1040     Visit Number 7    Number of Visits 17    Date for PT Re-Evaluation 06/17/21    Authorization Type MCR - FOTO 6th and 10th    PT Start Time [id number]    PT Stop Time [id number]    PT Time Calculation (min) 39 min    Activity Tolerance Patient tolerated treatment well    Behavior During Therapy [facility] for tasks assessed/performed             Past Medical History:  Diagnosis Date   Allergy    Arthritis    Depression    History of blood transfusion    Spider bite    2016 and 2017   UTERINE FIBROID 12/13/2006    Past Surgical History:  Procedure Laterality Date   SHOULDER SURGERY     TUBAL LIGATION      There were no vitals filed for this visit.   Subjective Assessment - 05/12/21 1040     Subjective Pt presents to PT with reports of R>L bilateral shoulder pain. She has been compliant with HEP with no adverse effect. She is ready to begin PT treatment at this time.    Currently in Pain? Yes    Pain Score 3     Pain Location Shoulder    Pain Orientation Right;Left                [facility] PT Assessment - 05/12/21 0001       Observation/Other Assessments   Focus on Therapeutic Outcomes (FOTO)  47% function                           [facility] Adult PT Treatment/Exercise - 05/12/21 0001       Shoulder Exercises: Standing   Protraction Limitations 2x10 serratus wall slide red tband and soft foam    External Rotation Limitations 2x10 7lbs ea    Internal Rotation Limitations 2x10 7lbs ea    Row Limitations 2x15 black tband    Other Standing Exercises high row w/ ER 2x10 red tband       Shoulder Exercises: ROM/Strengthening   Nustep lvl 5 x 5 min UE/LE while taking subjective                      PT Short Term Goals - 05/05/21 1243       PT SHORT TERM GOAL #1   Title Pt will be knowledge and compliant with initial HEP in order to improve comfort and carryover    Baseline initial HEP given    Time 3    Period Weeks    Status Achieved    Target Date 05/15/21      PT SHORT TERM GOAL #2   Title Pt will self report R shoulder pain no greater than 7/10 at worst in order to improve comfort and function    Baseline 10/10    Time 3    Period Weeks    Status Achieved    Target Date 05/15/21  PT Long Term Goals - 04/24/21 1137       PT LONG TERM GOAL #1   Title Pt will improve FOTO score to predicted value as a proxy for functional improvement    Baseline see flowsheet    Time 8    Period Weeks    Status New    Target Date 06/19/21      PT LONG TERM GOAL #2   Title Pt will improve R shoulder flex/abd AROM to no less than 150 degrees in order to improve functional ability    Baseline see flowsheet    Time 8    Period Weeks    Status New    Target Date 06/19/21      PT LONG TERM GOAL #3   Title Pt will be able to reach into cabinets not limited by pain for improved comfort and function    Baseline unable    Time 8    Period Weeks    Status New    Target Date 06/19/21      PT LONG TERM GOAL #4   Title Pt will self report R shoulder pain no greater than 4/10 at worst in order to improve comfort    Baseline 10/10    Time 8    Period Weeks    Status New    Target Date 06/19/21                   Plan - 05/12/21 1102     Clinical Impression Statement Pt was able to complete prescribed exercises with no adverse effect. Today's session focused on progression of periscapular and RTC strengthening. While her FOTO score did decrease slight from initial evaluation, she subjective feels her functional ability has greatly  improved since starting therapy. She continues to benefit from skilled PT and will continue to be progressed as able.    PT Treatment/Interventions ADLs/Self Care Home Management;Cryotherapy;Moist Heat;Electrical Stimulation;Therapeutic activities;Therapeutic exercise;Neuromuscular re-education;Patient/family education;Manual techniques;Dry needling;Vasopneumatic Device;Taping    PT Next Visit Plan progress periscapular and RTC strengthening as tolerated    PT Home Exercise Plan Access Code: [email or web address]             Patient will benefit from skilled therapeutic intervention in order to improve the following deficits and impairments:  Decreased endurance, Decreased range of motion, Decreased mobility, Decreased strength, Impaired sensation, Impaired UE functional use, Pain  Visit Diagnosis: Muscle weakness (generalized)  Stiffness of right shoulder, not elsewhere classified     Problem List Patient Active Problem List   Diagnosis Date Noted   Morbid obesity, unspecified obesity type ([place]) 01/05/2021   Allergic rhinitis 08/16/2019   Chronic pain of right knee 08/16/2019   Prediabetes 08/16/2019   Essential hypertension 03/05/2018   Preventative health care 10/12/2011   OBESITY 08/31/2008   GEN OSTEOARTHROSIS INVOLVING MULTIPLE SITES 08/31/2008   DEPRESSION, MAJOR, RECURRENT 12/13/2006    Ward Chatters, PT, DPT 05/12/21 11:26 AM  [facility] [address] [place], [place], [address] Phone: [phone]   Fax:  [phone]  Name: Doris Green MRN: [id number] Date of Birth: 01-Jan-1956

## 2021-05-18 ENCOUNTER — Ambulatory Visit: Payer: Medicare Other | Attending: Nurse Practitioner

## 2021-05-18 ENCOUNTER — Other Ambulatory Visit: Payer: Self-pay

## 2021-05-18 DIAGNOSIS — M6281 Muscle weakness (generalized): Secondary | ICD-10-CM | POA: Insufficient documentation

## 2021-05-18 DIAGNOSIS — M25611 Stiffness of right shoulder, not elsewhere classified: Secondary | ICD-10-CM | POA: Diagnosis present

## 2021-05-18 NOTE — Therapy (Signed)
[place], [place], [address] Phone: [phone]   Fax:  [phone]  Physical Therapy Treatment  Patient Details  Name: Doris Green MRN: [id number] Date of Birth: 1955/10/18 Referring Provider (PT): Vevelyn Francois, NP   Encounter Date: 05/18/2021   PT End of Session - 05/18/21 1233     Visit Number 8    Number of Visits 17    Date for PT Re-Evaluation 06/17/21    Authorization Type MCR - FOTO 6th and 10th    PT Start Time 1233    PT Stop Time [id number]    PT Time Calculation (min) 40 min    Activity Tolerance Patient tolerated treatment well    Behavior During Therapy [facility] for tasks assessed/performed             Past Medical History:  Diagnosis Date   Allergy    Arthritis    Depression    History of blood transfusion    Spider bite    2016 and 2017   UTERINE FIBROID 12/13/2006    Past Surgical History:  Procedure Laterality Date   SHOULDER SURGERY     TUBAL LIGATION      There were no vitals filed for this visit.   Subjective Assessment - 05/18/21 1233     Subjective My shoulders have been a little achy over the past few days maybe because of the weather or the way I slept.    Currently in Pain? Yes    Pain Score 4     Pain Location Shoulder    Pain Orientation Right;Left    Pain Descriptors / Indicators Aching    Pain Type Chronic pain    Pain Onset More than a month ago    Pain Frequency Intermittent                               [facility] Adult PT Treatment/Exercise - 05/18/21 0001       Shoulder Exercises: Supine   Protraction 10 reps    Protraction Weight (lbs) 5    Protraction Limitations x2      Shoulder Exercises: Sidelying   External Rotation 10 reps    External Rotation Weight (lbs) 2    External Rotation Limitations x2; bilateral      Shoulder Exercises: Standing   Horizontal ABduction 10 reps    Theraband Level (Shoulder Horizontal  ABduction) Level 3 (Green)    Horizontal ABduction Limitations x2    Diagonals 10 reps    Theraband Level (Shoulder Diagonals) Level 2 (Red)    Diagonals Limitations D2 extension; 2 sets bilateral    Other Standing Exercises adduction 2 x 10 bilateral green band    Other Standing Exercises serratus wall slide 2 x 15      Shoulder Exercises: ROM/Strengthening   Nustep level 5 x 5 min UE/LE                      PT Short Term Goals - 05/05/21 1243       PT SHORT TERM GOAL #1   Title Pt will be knowledge and compliant with initial HEP in order to improve comfort and carryover    Baseline initial HEP given    Time 3    Period Weeks    Status Achieved    Target Date 05/15/21      PT SHORT TERM GOAL #  2   Title Pt will self report R shoulder pain no greater than 7/10 at worst in order to improve comfort and function    Baseline 10/10    Time 3    Period Weeks    Status Achieved    Target Date 05/15/21               PT Long Term Goals - 04/24/21 1137       PT LONG TERM GOAL #1   Title Pt will improve FOTO score to predicted value as a proxy for functional improvement    Baseline see flowsheet    Time 8    Period Weeks    Status New    Target Date 06/19/21      PT LONG TERM GOAL #2   Title Pt will improve R shoulder flex/abd AROM to no less than 150 degrees in order to improve functional ability    Baseline see flowsheet    Time 8    Period Weeks    Status New    Target Date 06/19/21      PT LONG TERM GOAL #3   Title Pt will be able to reach into cabinets not limited by pain for improved comfort and function    Baseline unable    Time 8    Period Weeks    Status New    Target Date 06/19/21      PT LONG TERM GOAL #4   Title Pt will self report R shoulder pain no greater than 4/10 at worst in order to improve comfort    Baseline 10/10    Time 8    Period Weeks    Status New    Target Date 06/19/21                   Plan - 05/18/21  1254     Clinical Impression Statement Continued with progression of shoulder strengthening today with patient tolerating session well without reports of increased pain. She has good awareness of her posture as she is able to correct her shoulder shrug and compensatory trunk movement independently during ther ex. Reports of muscle fatigue at end of session, otherwise no complaints.    PT Treatment/Interventions ADLs/Self Care Home Management;Cryotherapy;Moist Heat;Electrical Stimulation;Therapeutic activities;Therapeutic exercise;Neuromuscular re-education;Patient/family education;Manual techniques;Dry needling;Vasopneumatic Device;Taping    PT Next Visit Plan progress periscapular and RTC strengthening as tolerated; update HEP    PT Home Exercise Plan Access Code: [email or web address]    Consulted and Agree with Plan of Care Patient             Patient will benefit from skilled therapeutic intervention in order to improve the following deficits and impairments:  Decreased endurance, Decreased range of motion, Decreased mobility, Decreased strength, Impaired sensation, Impaired UE functional use, Pain  Visit Diagnosis: Muscle weakness (generalized)  Stiffness of right shoulder, not elsewhere classified     Problem List Patient Active Problem List   Diagnosis Date Noted   Morbid obesity, unspecified obesity type ([place]) 01/05/2021   Allergic rhinitis 08/16/2019   Chronic pain of right knee 08/16/2019   Prediabetes 08/16/2019   Essential hypertension 03/05/2018   Preventative health care 10/12/2011   OBESITY 08/31/2008   GEN OSTEOARTHROSIS INVOLVING MULTIPLE SITES 08/31/2008   DEPRESSION, MAJOR, RECURRENT 12/13/2006   Gwendolyn Grant, PT, DPT, ATC 05/18/21 1:15 PM   [facility] [address] [place], [place], [address] Phone: [phone]   Fax:  [phone]  Name: Doris Green  Doris Green MRN: [id number] Date of Birth: April 12, 1956

## 2021-05-19 ENCOUNTER — Encounter: Payer: Self-pay | Admitting: Nurse Practitioner

## 2021-05-19 ENCOUNTER — Telehealth: Payer: Self-pay | Admitting: Nurse Practitioner

## 2021-05-19 ENCOUNTER — Ambulatory Visit (INDEPENDENT_AMBULATORY_CARE_PROVIDER_SITE_OTHER): Payer: Medicare Other | Admitting: Nurse Practitioner

## 2021-05-19 VITALS — BP 154/90 | HR 79 | Temp 97.2°F | Ht 62.0 in | Wt 224.0 lb

## 2021-05-19 DIAGNOSIS — I10 Essential (primary) hypertension: Secondary | ICD-10-CM | POA: Diagnosis not present

## 2021-05-19 DIAGNOSIS — M1711 Unilateral primary osteoarthritis, right knee: Secondary | ICD-10-CM

## 2021-05-19 DIAGNOSIS — R7303 Prediabetes: Secondary | ICD-10-CM | POA: Diagnosis not present

## 2021-05-19 DIAGNOSIS — G8929 Other chronic pain: Secondary | ICD-10-CM

## 2021-05-19 DIAGNOSIS — M25561 Pain in right knee: Secondary | ICD-10-CM | POA: Diagnosis not present

## 2021-05-19 DIAGNOSIS — Z13220 Encounter for screening for lipoid disorders: Secondary | ICD-10-CM | POA: Diagnosis not present

## 2021-05-19 LAB — POCT GLYCOSYLATED HEMOGLOBIN (HGB A1C): Hemoglobin A1C: 5.8 % — AB (ref 4.0–5.6)

## 2021-05-19 MED ORDER — BLOOD GLUCOSE MONITOR KIT: PACK | 0 refills | Status: DC

## 2021-05-19 NOTE — Telephone Encounter (Signed)
Upon checking out from appt today pt stated she forgot to ask for a referral for a Mammogram. Please advise and thank you.

## 2021-05-19 NOTE — Patient Instructions (Signed)
Prediabetes Eating Plan °Prediabetes is a condition that causes blood sugar (glucose) levels to be higher than normal. This increases the risk for developing type 2 diabetes (type 2 diabetes mellitus). Working with a health care provider or nutrition specialist (dietitian) to make diet and lifestyle changes can help prevent the onset of diabetes. These changes may help you: °Control your blood glucose levels. °Improve your cholesterol levels. °Manage your blood pressure. °What are tips for following this plan? °Reading food labels °Read food labels to check the amount of fat, salt (sodium), and sugar in prepackaged foods. Avoid foods that have: °Saturated fats. °Trans fats. °Added sugars. °Avoid foods that have more than 300 milligrams (mg) of sodium per serving. Limit your sodium intake to less than 2,300 mg each day. °Shopping °Avoid buying pre-made and processed foods. °Avoid buying drinks with added sugar. °Cooking °Cook with olive oil. Do not use butter, lard, or ghee. °Bake, broil, grill, steam, or boil foods. Avoid frying. °Meal planning ° °Work with your dietitian to create an eating plan that is right for you. This may include tracking how many calories you take in each day. Use a food diary, notebook, or mobile application to track what you eat at each meal. °Consider following a Mediterranean diet. This includes: °Eating several servings of fresh fruits and vegetables each day. °Eating fish at least twice a week. °Eating one serving each day of whole grains, beans, nuts, and seeds. °Using olive oil instead of other fats. °Limiting alcohol. °Limiting red meat. °Using nonfat or low-fat dairy products. °Consider following a plant-based diet. This includes dietary choices that focus on eating mostly vegetables and fruit, grains, beans, nuts, and seeds. °If you have high blood pressure, you may need to limit your sodium intake or follow a diet such as the DASH (Dietary Approaches to Stop Hypertension) eating  plan. The DASH diet aims to lower high blood pressure. °Lifestyle °Set weight loss goals with help from your health care team. It is recommended that most people with prediabetes lose 7% of their body weight. °Exercise for at least 30 minutes 5 or more days a week. °Attend a support group or seek support from a mental health counselor. °Take over-the-counter and prescription medicines only as told by your health care provider. °What foods are recommended? °Fruits °Berries. Bananas. Apples. Oranges. Grapes. Papaya. Mango. Pomegranate. Kiwi. Grapefruit. Cherries. °Vegetables °Lettuce. Spinach. Peas. Beets. Cauliflower. Cabbage. Broccoli. Carrots. Tomatoes. Squash. Eggplant. Herbs. Peppers. Onions. Cucumbers. Brussels sprouts. °Grains °Whole grains, such as whole-wheat or whole-grain breads, crackers, cereals, and pasta. Unsweetened oatmeal. Bulgur. Barley. Quinoa. Brown rice. Corn or whole-wheat flour tortillas or taco shells. °Meats and other proteins °Seafood. Poultry without skin. Lean cuts of pork and beef. Tofu. Eggs. Nuts. Beans. °Dairy °Low-fat or fat-free dairy products, such as yogurt, cottage cheese, and cheese. °Beverages °Water. Tea. Coffee. Sugar-free or diet soda. Seltzer water. Low-fat or nonfat milk. Milk alternatives, such as soy or almond milk. °Fats and oils °Olive oil. Canola oil. Sunflower oil. Grapeseed oil. Avocado. Walnuts. °Sweets and desserts °Sugar-free or low-fat pudding. Sugar-free or low-fat ice cream and other frozen treats. °Seasonings and condiments °Herbs. Sodium-free spices. Mustard. Relish. Low-salt, low-sugar ketchup. Low-salt, low-sugar barbecue sauce. Low-fat or fat-free mayonnaise. °The items listed above may not be a complete list of recommended foods and beverages. Contact a dietitian for more information. °What foods are not recommended? °Fruits °Fruits canned with syrup. °Vegetables °Canned vegetables. Frozen vegetables with butter or cream sauce. °Grains °Refined white  flour and flour   products, such as bread, pasta, snack foods, and cereals. °Meats and other proteins °Fatty cuts of meat. Poultry with skin. Breaded or fried meat. Processed meats. °Dairy °Full-fat yogurt, cheese, or milk. °Beverages °Sweetened drinks, such as iced tea and soda. °Fats and oils °Butter. Lard. Ghee. °Sweets and desserts °Baked goods, such as cake, cupcakes, pastries, cookies, and cheesecake. °Seasonings and condiments °Spice mixes with added salt. Ketchup. Barbecue sauce. Mayonnaise. °The items listed above may not be a complete list of foods and beverages that are not recommended. Contact a dietitian for more information. °Where to find more information °American Diabetes Association: www.diabetes.org °Summary °You may need to make diet and lifestyle changes to help prevent the onset of diabetes. These changes can help you control blood sugar, improve cholesterol levels, and manage blood pressure. °Set weight loss goals with help from your health care team. It is recommended that most people with prediabetes lose 7% of their body weight. °Consider following a Mediterranean diet. This includes eating plenty of fresh fruits and vegetables, whole grains, beans, nuts, seeds, fish, and low-fat dairy, and using olive oil instead of other fats. °This information is not intended to replace advice given to you by your health care provider. Make sure you discuss any questions you have with your health care provider. °Document Revised: 01/01/2020 Document Reviewed: 01/01/2020 °Elsevier Patient Education © 2022 Elsevier Inc. ° °

## 2021-05-19 NOTE — Progress Notes (Signed)
Doris Green, [place]  [address] Phone:  [phone]   Fax:  [phone]   Established Patient Office Visit  Subjective:  Patient ID: Doris Green, female    DOB: 15-Mar-1956  Age: 65 y.o. MRN: [id number]  CC: No chief complaint on file.   HPI Doris Green presents for follow up. She  has a past medical history of Allergy, Arthritis, Depression, History of blood transfusion, Spider bite, and UTERINE FIBROID (12/13/2006).   She has HCTZ 12.5 mg and amlodipine 2.5 mg. She uses this sparingly. She wants to do things naturally. She has occasional headache and dizziness with floater. She has occasional in shortness of breath. She denies any chest pain. Denies nausea, vomiting or any edema.   She also has prediabetes. She is trying to change her eating habits but her weight has not improved. She is currently in PT for her shoulder pain and this is going well.   Past Medical History:  Diagnosis Date   Allergy    Arthritis    Depression    History of blood transfusion    Spider bite    2016 and 2017   UTERINE FIBROID 12/13/2006    Past Surgical History:  Procedure Laterality Date   SHOULDER SURGERY     TUBAL LIGATION      Family History  Problem Relation Age of Onset   Diabetes Mother    Hypertension Mother    Heart disease Mother    Cancer Father    Diabetes Sister    Hypertension Sister    Heart disease Sister    Hypertension Brother    Diabetes Brother    Cancer Maternal Grandmother    Colon cancer Neg Hx    Colon polyps Neg Hx    Esophageal cancer Neg Hx    Stomach cancer Neg Hx    Rectal cancer Neg Hx    Breast cancer Neg Hx     Social History   Socioeconomic History   Marital status: Single    Spouse name: Not on file   Number of children: 3   Years of education: GED   Highest education level: Not on file  Occupational History   Occupation: Custodian    Employer: [employer]  Tobacco Use    Smoking status: Some Days    Packs/day: 0.25    Types: Cigarettes    Last attempt to quit: 10/19/2011    Years since quitting: 9.5   Smokeless tobacco: Never   Tobacco comments:    2-3  cigs// per day  Vaping Use   Vaping Use: Never used  Substance and Sexual Activity   Alcohol use: No   Drug use: No   Sexual activity: Not on file  Other Topics Concern   Not on file  Social History Narrative   Not on file   Social Determinants of Health   Financial Resource Strain: Not on file  Food Insecurity: Not on file  Transportation Needs: Not on file  Physical Activity: Not on file  Stress: Not on file  Social Connections: Not on file  Intimate Partner Violence: Not on file    Outpatient Medications Prior to Visit  Medication Sig Dispense Refill   amLODipine (NORVASC) 2.5 MG tablet Take 1 tablet (2.5 mg total) by mouth daily. 30 tablet 11   Ascorbic Acid (VITAMIN C) 1000 MG tablet Take 1,000 mg by mouth daily.     Blood Pressure Monitoring (BLOOD PRESSURE  MONITOR AUTOMAT) DEVI 1 kit by Does not apply route daily. 1 each 0   cholecalciferol (VITAMIN D3) 25 MCG (1000 UT) tablet Take 1,000 Units by mouth daily.     hydrochlorothiazide (HYDRODIURIL) 12.5 MG tablet Take 1 tablet (12.5 mg total) by mouth daily. TAKE 1 TABLET(12.5 MG) BY MOUTH DAILY 30 tablet 11   loratadine (CLARITIN) 10 MG tablet Take 1 tablet (10 mg total) by mouth daily. 30 tablet 11   Multiple Vitamins-Minerals (WOMENS HAIR, SKIN & NAILS PO) Take by mouth.     neomycin-polymyxin-hydrocortisone (CORTISPORIN) OTIC solution Place 3 drops into both ears 4 (four) times daily. 10 mL 0   Omega-3 1000 MG CAPS Take by mouth.     OVER THE COUNTER MEDICATION Beet chew, Boisenberry Gold     Probiotic Product (PROBIOTIC ADVANCED PO) Take by mouth as needed.     Zinc-Magnesium Aspart-Vit B6 (ZINC MAGNESIUM ASPARTATE PO) Take by mouth. Once a day     meloxicam (MOBIC) 7.5 MG tablet Take 1 tablet (7.5 mg total) by mouth daily. (Patient  not taking: Reported on 05/19/2021) 30 tablet 11   No facility-administered medications prior to visit.    Allergies  Allergen Reactions   Ibuprofen     Blood in stool    ROS Review of Systems    Objective:    Physical Exam Constitutional:      Appearance: She is obese.  HENT:     Head: Normocephalic and atraumatic.     Nose: Nose normal.     Mouth/Throat:     Mouth: Mucous membranes are moist.  Cardiovascular:     Rate and Rhythm: Normal rate and regular rhythm.     Pulses: Normal pulses.     Heart sounds: Normal heart sounds.  Pulmonary:     Effort: Pulmonary effort is normal.  Abdominal:     Palpations: Abdomen is soft.  Musculoskeletal:        General: Tenderness present.     Right shoulder: Tenderness present. Decreased range of motion. Normal strength.     Cervical back: Normal range of motion.     Right lower leg: No edema.     Left lower leg: No edema.  Skin:    General: Skin is warm and dry.     Capillary Refill: Capillary refill takes less than 2 seconds.  Neurological:     General: No focal deficit present.     Mental Status: She is alert and oriented to person, place, and time.    BP (!) 154/90 (BP Location: Left Arm, Patient Position: Sitting)   Pulse 79   Temp (!) 97.2 F (36.2 C)   Ht 5' 2"  (1.575 m)   Wt 224 lb 0.6 oz (101.6 kg)   SpO2 100%   BMI 40.98 kg/m  Wt Readings from Last 3 Encounters:  05/19/21 224 lb 0.6 oz (101.6 kg)  04/06/21 224 lb 0.2 oz (101.6 kg)  02/17/21 222 lb 6.4 oz (100.9 kg)     Health Maintenance Due  Topic Date Due   COVID-19 Vaccine (1) Never done   Zoster Vaccines- Shingrix (1 of 2) Never done   PNA vac Low Risk Adult (1 of 2 - PCV13) Never done   INFLUENZA VACCINE  05/16/2021    There are no preventive care reminders to display for this patient.  Lab Results  Component Value Date   TSH 1.930 01/15/2020   Lab Results  Component Value Date   WBC 8.4 01/15/2020   HGB  12.9 01/15/2020   HCT 39.4  01/15/2020   MCV 80 01/15/2020   PLT 283 01/15/2020   Lab Results  Component Value Date   NA 145 (H) 01/05/2021   K 4.5 01/05/2021   CO2 23 08/12/2019   GLUCOSE 99 01/05/2021   BUN 9 01/05/2021   CREATININE 0.84 01/05/2021   BILITOT 0.2 01/05/2021   ALKPHOS 83 01/05/2021   AST 10 01/05/2021   ALT 15 08/07/2018   PROT 6.7 01/05/2021   ALBUMIN 4.3 01/05/2021   CALCIUM 9.4 01/05/2021   EGFR 77 01/05/2021   Lab Results  Component Value Date   CHOL 184 01/05/2021   Lab Results  Component Value Date   HDL 70 01/05/2021   Lab Results  Component Value Date   LDLCALC 96 01/05/2021   Lab Results  Component Value Date   TRIG 102 01/05/2021   Lab Results  Component Value Date   CHOLHDL 2.6 01/05/2021   Lab Results  Component Value Date   HGBA1C 5.8 (A) 05/19/2021      Assessment & Plan:   Problem List Items Addressed This Visit       Cardiovascular and Mediastinum   Essential hypertension - Primary Encouraged on going compliance with current medication regimen Encouraged home monitoring and recording BP <130/80 Eating a heart-healthy diet with less salt Encouraged regular physical activity  Recommend Weight loss   Relevant Orders   Urinalysis Dipstick     Other   Prediabetes Consider home glucose monitoring Weight loss at least 5% of current body weight is can be achieved with lifestyle modification dietary changes and regular daily exercise Encourage blood pressure control goal <120/80 and maintaining total cholesterol <200 Follow-up every 3 to 6 months for reevaluation Education material provided    Relevant Orders   HgB A1c (Completed)   Urinalysis Dipstick  Chronic right knee pain  Referral to PT for evaluation     Meds ordered this encounter  Medications   blood glucose meter kit and supplies KIT    Sig: Dispense based on patient and insurance preference. Use up to four times daily as directed.    Dispense:  1 each    Refill:  0    Order  Specific Question:   Supervising Provider    Answer:   Tresa Garter [id number]    Order Specific Question:   Number of strips    Answer:   100    Order Specific Question:   Number of lancets    Answer:   100    Follow-up: Return in about 3 months (around 08/19/2021) for Follow up HTN 69223.    Vevelyn Francois, NP

## 2021-05-23 ENCOUNTER — Ambulatory Visit: Payer: Medicare Other

## 2021-05-25 ENCOUNTER — Other Ambulatory Visit: Payer: Self-pay

## 2021-05-25 ENCOUNTER — Ambulatory Visit: Payer: Medicare Other

## 2021-05-25 VITALS — BP 150/92

## 2021-05-25 DIAGNOSIS — M25611 Stiffness of right shoulder, not elsewhere classified: Secondary | ICD-10-CM

## 2021-05-25 DIAGNOSIS — M6281 Muscle weakness (generalized): Secondary | ICD-10-CM

## 2021-05-25 DIAGNOSIS — R7303 Prediabetes: Secondary | ICD-10-CM

## 2021-05-25 MED ORDER — ONETOUCH ULTRASOFT LANCETS MISC: 12 refills | Status: DC

## 2021-05-25 MED ORDER — GLUCOSE BLOOD VI STRP: ORAL_STRIP | 12 refills | Status: DC

## 2021-05-25 MED ORDER — ONETOUCH ULTRA 2 W/DEVICE KIT: PACK | 0 refills | Status: DC

## 2021-05-25 NOTE — Therapy (Addendum)
[place], [place], [address] Phone: [phone]   Fax:  [phone]  Physical Therapy Treatment/Progress/Re-Evaluation  Patient Details  Name: Doris Green MRN: [id number] Date of Birth: August 02, 1956 Referring Provider (PT): Vevelyn Francois, NP  Progress Note Reporting Period 04/22/21 to 05/25/21  See note below for Objective Data and Assessment of Progress/Goals.      Encounter Date: 05/25/2021   PT End of Session - 05/25/21 1215     Visit Number 9    Number of Visits 17    Date for PT Re-Evaluation 07/20/21    Authorization Type MCR - FOTO 6th and 10th    PT Start Time 1215    PT Stop Time 1255    PT Time Calculation (min) 40 min    Activity Tolerance Patient tolerated treatment well    Behavior During Therapy [facility] for tasks assessed/performed             Past Medical History:  Diagnosis Date   Allergy    Arthritis    Depression    History of blood transfusion    Spider bite    2016 and 2017   UTERINE FIBROID 12/13/2006    Past Surgical History:  Procedure Laterality Date   SHOULDER SURGERY     TUBAL LIGATION      Vitals:   05/25/21 1215  BP: (!) 150/92     Subjective Assessment - 05/25/21 1215     Subjective Pt presents to PT with reports of continued chronic R knee pain. She has new referral to incorporate R knee into PT treatment. She notes that she has been compliant with her shoulder HEP and notes continued improvement in this area. Pt notes that stairs and prolonged sitting tend to increase pain.    How long can you sit comfortably? 10-5mn    How long can you stand comfortably? 10 min    How long can you walk comfortably? 10-15 min    Currently in Pain? Yes    Pain Score 7     Pain Location Knee    Pain Orientation Right                [facility] PT Assessment - 05/25/21 0001       Observation/Other Assessments   Other Surveys  Lower Extremity Functional  Scale    Lower Extremity Functional Scale  21/80      Transfers   Comments 30 Sec STS: 10 reps                           [facility] Adult PT Treatment/Exercise - 05/25/21 0001       Exercises   Exercises Knee/Hip      Knee/Hip Exercises: Aerobic   Nustep lvl 5 x 5 min UE/LE while taking subjective      Knee/Hip Exercises: Seated   Sit to Sand 2 sets;10 reps      Knee/Hip Exercises: Supine   Straight Leg Raises 10 reps;Both    Other Supine Knee/Hip Exercises clamshell 2x15 GTB      Knee/Hip Exercises: Sidelying   Clams 2x10 GTB                    PT Education - 05/25/21 1349     Education Details new HEP for LE strengthening    Person(s) Educated Patient    Methods Explanation;Demonstration;Handout    Comprehension Verbalized understanding;Returned demonstration  PT Short Term Goals - 05/05/21 1243       PT SHORT TERM GOAL #1   Title Pt will be knowledge and compliant with initial HEP in order to improve comfort and carryover    Baseline initial HEP given    Time 3    Period Weeks    Status Achieved    Target Date 05/15/21      PT SHORT TERM GOAL #2   Title Pt will self report R shoulder pain no greater than 7/10 at worst in order to improve comfort and function    Baseline 10/10    Time 3    Period Weeks    Status Achieved    Target Date 05/15/21               PT Long Term Goals - 05/25/21 1343       PT LONG TERM GOAL #1   Title Pt will improve FOTO score to predicted value as a proxy for functional improvement    Baseline see flowsheet    Time 8    Period Weeks    Status New    Target Date 07/20/21      PT LONG TERM GOAL #2   Title Pt will improve R shoulder flex/abd AROM to no less than 150 degrees in order to improve functional ability    Baseline see flowsheet    Time 8    Period Weeks    Status New    Target Date 07/20/21      PT LONG TERM GOAL #3   Title Pt will be able to reach into  cabinets not limited by pain for improved comfort and function    Baseline unable    Time 8    Period Weeks    Status New    Target Date 07/20/21      PT LONG TERM GOAL #4   Title Pt will self report R shoulder pain no greater than 4/10 at worst in order to improve comfort    Baseline 10/10    Time 8    Period Weeks    Status New    Target Date 07/20/21      PT LONG TERM GOAL #5   Title Pt will increase reps in 30 sec STS to no less than 15 with no increase in R knee pain in order to improve balance and mobility    Baseline 10 reps    Time 8    Period Weeks    Status New    Target Date 07/20/21      Additional Long Term Goals   Additional Long Term Goals Yes      PT LONG TERM GOAL #6   Title Pt will improve LEFS score to no less than 35/80 in order to improve confidence and function    Baseline 21/80    Time 8    Period Weeks    Status New    Target Date 07/20/21      PT LONG TERM GOAL #7   Title Pt will be able to ambulate up/down 10 stairs with no increase in bilat knee pain for improve mobility and comfort    Baseline unable without pain    Time 8    Period Weeks    Status New    Target Date 07/20/21                   Plan - 05/25/21 1346  Clinical Impression Statement Pt was able to complete prescribed exercises for LE strengthening with no adverse effect. PT assessed R knee pain following new order from MD. Pt's 30 Sec STS demonstrates decreased functional hip strength and mobility. Likewise, her LEFS score shows significant disability in the functioning of home ADLs and higher leve activities, indicating pt is operating below PLOF. She would benefit from continued PT services working on improving LE strength while continuing work on decreasing R shoulder pain.    PT Frequency 2x / week    PT Duration 8 weeks    PT Treatment/Interventions ADLs/Self Care Home Management;Cryotherapy;Moist Heat;Electrical Stimulation;Therapeutic activities;Therapeutic  exercise;Neuromuscular re-education;Patient/family education;Manual techniques;Dry needling;Vasopneumatic Device;Taping;Stair training;Gait training;Functional mobility training;Balance training    PT Next Visit Plan progress LE strengthening as able; also progress periscapular strengthening; assess resposne to new HEP    PT Home Exercise Plan Access Code: [email or web address]    Consulted and Agree with Plan of Care Patient             Patient will benefit from skilled therapeutic intervention in order to improve the following deficits and impairments:  Decreased endurance, Decreased range of motion, Decreased mobility, Decreased strength, Impaired sensation, Impaired UE functional use, Pain, Difficulty walking, Decreased activity tolerance  Visit Diagnosis: Muscle weakness (generalized) - Plan: PT plan of care cert/re-cert  Stiffness of right shoulder, not elsewhere classified - Plan: PT plan of care cert/re-cert     Problem List Patient Active Problem List   Diagnosis Date Noted   Morbid obesity, unspecified obesity type ([place]) 01/05/2021   Allergic rhinitis 08/16/2019   Chronic pain of right knee 08/16/2019   Prediabetes 08/16/2019   Essential hypertension 03/05/2018   Preventative health care 10/12/2011   OBESITY 08/31/2008   GEN OSTEOARTHROSIS INVOLVING MULTIPLE SITES 08/31/2008   DEPRESSION, MAJOR, RECURRENT 12/13/2006    Ward Chatters, PT, DPT 05/25/21 1:51 PM  [facility] [address] [place], [place], [address] Phone: [phone]   Fax:  [phone]  Name: Shenelle Hager MRN: [id number] Date of Birth: [birth year]

## 2021-05-26 ENCOUNTER — Other Ambulatory Visit: Payer: Medicare Other

## 2021-05-26 DIAGNOSIS — Z13220 Encounter for screening for lipoid disorders: Secondary | ICD-10-CM

## 2021-05-26 DIAGNOSIS — Z780 Asymptomatic menopausal state: Secondary | ICD-10-CM

## 2021-05-26 DIAGNOSIS — I10 Essential (primary) hypertension: Secondary | ICD-10-CM

## 2021-05-26 DIAGNOSIS — Z13820 Encounter for screening for osteoporosis: Secondary | ICD-10-CM

## 2021-05-27 LAB — COMP. METABOLIC PANEL (12)
AST: 15 IU/L (ref 0–40)
Albumin/Globulin Ratio: 2 (ref 1.2–2.2)
Albumin: 4.3 g/dL (ref 3.8–4.8)
Alkaline Phosphatase: 82 IU/L (ref 44–121)
BUN/Creatinine Ratio: 11 — ABNORMAL LOW (ref 12–28)
BUN: 9 mg/dL (ref 8–27)
Bilirubin Total: 0.2 mg/dL (ref 0.0–1.2)
Calcium: 9.4 mg/dL (ref 8.7–10.3)
Chloride: 106 mmol/L (ref 96–106)
Creatinine, Ser: 0.85 mg/dL (ref 0.57–1.00)
Globulin, Total: 2.2 g/dL (ref 1.5–4.5)
Glucose: 104 mg/dL — ABNORMAL HIGH (ref 65–99)
Potassium: 4.5 mmol/L (ref 3.5–5.2)
Sodium: 141 mmol/L (ref 134–144)
Total Protein: 6.5 g/dL (ref 6.0–8.5)
eGFR: 76 mL/min/{1.73_m2} (ref >59)

## 2021-05-27 LAB — LIPID PANEL
Chol/HDL Ratio: 2.7 ratio (ref 0.0–4.4)
Cholesterol, Total: 195 mg/dL (ref 100–199)
HDL: 71 mg/dL (ref >39)
LDL Chol Calc (NIH): 105 mg/dL — ABNORMAL HIGH (ref 0–99)
Triglycerides: 106 mg/dL (ref 0–149)
VLDL Cholesterol Cal: 19 mg/dL (ref 5–40)

## 2021-05-30 ENCOUNTER — Ambulatory Visit: Payer: Medicare Other

## 2021-05-30 ENCOUNTER — Other Ambulatory Visit: Payer: Self-pay

## 2021-05-30 DIAGNOSIS — M6281 Muscle weakness (generalized): Secondary | ICD-10-CM | POA: Diagnosis not present

## 2021-05-30 DIAGNOSIS — M25611 Stiffness of right shoulder, not elsewhere classified: Secondary | ICD-10-CM

## 2021-05-30 NOTE — Therapy (Signed)
[place], [place], [address] Phone: [phone]   Fax:  [phone]  Physical Therapy Treatment  Patient Details  Name: Doris Green MRN: [id number] Date of Birth: 1956/09/20 Referring Provider (PT): Vevelyn Francois, NP   Encounter Date: 05/30/2021   PT End of Session - 05/30/21 1355     Visit Number 10    Number of Visits 17    Date for PT Re-Evaluation 07/20/21    Authorization Type MCR - FOTO 6th and 10th    Progress Note Due on Visit --   performed last session w/ re-evaluation   PT Start Time 1400    PT Stop Time 1447    PT Time Calculation (min) 47 min    Activity Tolerance Patient tolerated treatment well    Behavior During Therapy [facility] for tasks assessed/performed             Past Medical History:  Diagnosis Date   Allergy    Arthritis    Depression    History of blood transfusion    Spider bite    2016 and 2017   UTERINE FIBROID 12/13/2006    Past Surgical History:  Procedure Laterality Date   SHOULDER SURGERY     TUBAL LIGATION      There were no vitals filed for this visit.   Subjective Assessment - 05/30/21 1358     Subjective Pt presents to PT with continued reports of shoulder and R knee pain. She has been compliant with her HEP with no adverse effect. Pt is ready to begin PT treatment at this time.    Currently in Pain? Yes    Pain Score 5     Pain Location Knee    Pain Orientation Right    Multiple Pain Sites Yes    Pain Score 5    Pain Location Shoulder    Pain Orientation Right                [facility] PT Assessment - 05/30/21 0001       Observation/Other Assessments   Focus on Therapeutic Outcomes (FOTO)  64% function                           [facility] Adult PT Treatment/Exercise - 05/30/21 0001       Knee/Hip Exercises: Aerobic   Nustep lvl 5 x 5 min UE/LE while taking subjective      Knee/Hip Exercises: Standing   Abduction  Limitations 2x10    Functional Squat Limitations 2x10 holding freemtion handle      Knee/Hip Exercises: Seated   Long Arc Quad 2 sets;10 reps    Long Arc Quad Weight 2 lbs.    Sit to [employer] 10 reps      Knee/Hip Exercises: Supine   Bridges with Clamshell 2 sets;10 reps   green tband     Knee/Hip Exercises: Sidelying   Clams 2x15 green tband      Shoulder Exercises: Seated   Other Seated Exercises seated bilat ER 2x10 GTB      Shoulder Exercises: Sidelying   External Rotation Limitations 2x10 2lbs      Shoulder Exercises: Standing   Row Limitations 2x15 black tband                      PT Short Term Goals - 05/05/21 1243       PT SHORT TERM GOAL #  1   Title Pt will be knowledge and compliant with initial HEP in order to improve comfort and carryover    Baseline initial HEP given    Time 3    Period Weeks    Status Achieved    Target Date 05/15/21      PT SHORT TERM GOAL #2   Title Pt will self report R shoulder pain no greater than 7/10 at worst in order to improve comfort and function    Baseline 10/10    Time 3    Period Weeks    Status Achieved    Target Date 05/15/21               PT Long Term Goals - 05/25/21 1343       PT LONG TERM GOAL #1   Title Pt will improve FOTO score to predicted value as a proxy for functional improvement    Baseline see flowsheet    Time 8    Period Weeks    Status New    Target Date 07/20/21      PT LONG TERM GOAL #2   Title Pt will improve R shoulder flex/abd AROM to no less than 150 degrees in order to improve functional ability    Baseline see flowsheet    Time 8    Period Weeks    Status New    Target Date 07/20/21      PT LONG TERM GOAL #3   Title Pt will be able to reach into cabinets not limited by pain for improved comfort and function    Baseline unable    Time 8    Period Weeks    Status New    Target Date 07/20/21      PT LONG TERM GOAL #4   Title Pt will self report R shoulder pain no  greater than 4/10 at worst in order to improve comfort    Baseline 10/10    Time 8    Period Weeks    Status New    Target Date 07/20/21      PT LONG TERM GOAL #5   Title Pt will increase reps in 30 sec STS to no less than 15 with no increase in R knee pain in order to improve balance and mobility    Baseline 10 reps    Time 8    Period Weeks    Status New    Target Date 07/20/21      Additional Long Term Goals   Additional Long Term Goals Yes      PT LONG TERM GOAL #6   Title Pt will improve LEFS score to no less than 35/80 in order to improve confidence and function    Baseline 21/80    Time 8    Period Weeks    Status New    Target Date 07/20/21      PT LONG TERM GOAL #7   Title Pt will be able to ambulate up/down 10 stairs with no increase in bilat knee pain for improve mobility and comfort    Baseline unable without pain    Time 8    Period Weeks    Status New    Target Date 07/20/21                   Plan - 05/30/21 1440     Clinical Impression Statement Pt was once again able to complete exercises with no adverse effect. Today's sesison  focused on increasing RTC and periscapular muscle strength, along with increasing general LE strength to reduce shoulder and knee pain respectively. She continues to make good progress with PT, as evidenced by increase in FOTO score to 64% function today, compared to 55% at initial eval. PT will continue to progress exercises as tolerated as pt's strength improves in order to continue towards decreasing pain and improving functional ability.    PT Treatment/Interventions ADLs/Self Care Home Management;Cryotherapy;Moist Heat;Electrical Stimulation;Therapeutic activities;Therapeutic exercise;Neuromuscular re-education;Patient/family education;Manual techniques;Dry needling;Vasopneumatic Device;Taping;Stair training;Gait training;Functional mobility training;Balance training    PT Next Visit Plan progress LE strengthening as able;  also progress periscapular strengthening; assess resposne to new HEP    PT Home Exercise Plan Access Code: [email or web address]             Patient will benefit from skilled therapeutic intervention in order to improve the following deficits and impairments:  Decreased endurance, Decreased range of motion, Decreased mobility, Decreased strength, Impaired sensation, Impaired UE functional use, Pain, Difficulty walking, Decreased activity tolerance  Visit Diagnosis: Muscle weakness (generalized)  Stiffness of right shoulder, not elsewhere classified     Problem List Patient Active Problem List   Diagnosis Date Noted   Morbid obesity, unspecified obesity type ([place]) 01/05/2021   Allergic rhinitis 08/16/2019   Chronic pain of right knee 08/16/2019   Prediabetes 08/16/2019   Essential hypertension 03/05/2018   Preventative health care 10/12/2011   OBESITY 08/31/2008   GEN OSTEOARTHROSIS INVOLVING MULTIPLE SITES 08/31/2008   DEPRESSION, MAJOR, RECURRENT 12/13/2006    Ward Chatters, PT, DPT 05/30/21 2:49 PM  [facility] [address] [place], [place], [address] Phone: [phone]   Fax:  [phone]  Name: Doris Green MRN: [id number] Date of Birth: 1955-11-24

## 2021-06-01 ENCOUNTER — Ambulatory Visit: Payer: Medicare Other

## 2021-06-01 ENCOUNTER — Other Ambulatory Visit: Payer: Self-pay

## 2021-06-01 DIAGNOSIS — M6281 Muscle weakness (generalized): Secondary | ICD-10-CM

## 2021-06-01 DIAGNOSIS — M25611 Stiffness of right shoulder, not elsewhere classified: Secondary | ICD-10-CM

## 2021-06-01 NOTE — Therapy (Signed)
[place], [place], [address] Phone: [phone]   Fax:  [phone]  Physical Therapy Treatment  Patient Details  Name: Doris Green MRN: [id number] Date of Birth: [birth year] Referring Provider (PT): Vevelyn Francois, NP   Encounter Date: 06/01/2021   PT End of Session - 06/01/21 1149     Visit Number 11    Number of Visits 17    Date for PT Re-Evaluation 07/20/21    Authorization Type MCR - FOTO 6th and 10th    Progress Note Due on Visit 19    PT Start Time 1146    PT Stop Time 1227    PT Time Calculation (min) 41 min    Activity Tolerance Patient tolerated treatment well    Behavior During Therapy [facility] for tasks assessed/performed             Past Medical History:  Diagnosis Date   Allergy    Arthritis    Depression    History of blood transfusion    Spider bite    2016 and 2017   UTERINE FIBROID 12/13/2006    Past Surgical History:  Procedure Laterality Date   SHOULDER SURGERY     TUBAL LIGATION      There were no vitals filed for this visit.   Subjective Assessment - 06/01/21 1150     Subjective Patient reports the knee is hurting a little bit, the shoulder still feels pretty good.    Currently in Pain? Yes    Pain Score 3     Pain Location Knee    Pain Orientation Right;Anterior;Medial    Pain Descriptors / Indicators Burning;Aching    Pain Type Chronic pain    Pain Onset More than a month ago    Pain Frequency Constant    Aggravating Factors  prolonged standing, walking    Pain Relieving Factors epsom salt    Multiple Pain Sites No                [facility] PT Assessment - 06/01/21 0001       Special Tests   Other special tests LLD: ASIS to medial malleoli 88 cm bilaterally                           [facility] Adult PT Treatment/Exercise - 06/01/21 0001       Self-Care   Self-Care Heat/Ice Application    Heat/Ice Application ice for  pain/swelling      Knee/Hip Exercises: Stretches   Passive Hamstring Stretch 30 seconds    Passive Hamstring Stretch Limitations x2; Rt    Gastroc Stretch 60 seconds    Gastroc Stretch Limitations wedge      Knee/Hip Exercises: Standing   Heel Raises 10 reps    Heel Raises Limitations x2      Knee/Hip Exercises: Seated   Long Arc Quad 2 sets;10 reps    Long Arc Quad Weight 2 lbs.    Long Arc Quad Limitations bilateral    Hamstring Curl 10 reps    Hamstring Limitations x2; bilateral green band      Knee/Hip Exercises: Supine   Bridges with Clamshell 10 reps;2 sets   green band   Straight Leg Raises 10 reps    Straight Leg Raises Limitations x2; bilateral      Knee/Hip Exercises: Sidelying   Hip ABduction 10 reps    Hip ABduction Limitations x2; bilateral  PT Education - 06/01/21 1224     Education Details Education on use of ice to reduce pain/swelling.    Person(s) Educated Patient    Methods Explanation    Comprehension Verbalized understanding              PT Short Term Goals - 05/05/21 1243       PT SHORT TERM GOAL #1   Title Pt will be knowledge and compliant with initial HEP in order to improve comfort and carryover    Baseline initial HEP given    Time 3    Period Weeks    Status Achieved    Target Date 05/15/21      PT SHORT TERM GOAL #2   Title Pt will self report R shoulder pain no greater than 7/10 at worst in order to improve comfort and function    Baseline 10/10    Time 3    Period Weeks    Status Achieved    Target Date 05/15/21               PT Long Term Goals - 05/25/21 1343       PT LONG TERM GOAL #1   Title Pt will improve FOTO score to predicted value as a proxy for functional improvement    Baseline see flowsheet    Time 8    Period Weeks    Status New    Target Date 07/20/21      PT LONG TERM GOAL #2   Title Pt will improve R shoulder flex/abd AROM to no less than 150 degrees in order to  improve functional ability    Baseline see flowsheet    Time 8    Period Weeks    Status New    Target Date 07/20/21      PT LONG TERM GOAL #3   Title Pt will be able to reach into cabinets not limited by pain for improved comfort and function    Baseline unable    Time 8    Period Weeks    Status New    Target Date 07/20/21      PT LONG TERM GOAL #4   Title Pt will self report R shoulder pain no greater than 4/10 at worst in order to improve comfort    Baseline 10/10    Time 8    Period Weeks    Status New    Target Date 07/20/21      PT LONG TERM GOAL #5   Title Pt will increase reps in 30 sec STS to no less than 15 with no increase in R knee pain in order to improve balance and mobility    Baseline 10 reps    Time 8    Period Weeks    Status New    Target Date 07/20/21      Additional Long Term Goals   Additional Long Term Goals Yes      PT LONG TERM GOAL #6   Title Pt will improve LEFS score to no less than 35/80 in order to improve confidence and function    Baseline 21/80    Time 8    Period Weeks    Status New    Target Date 07/20/21      PT LONG TERM GOAL #7   Title Pt will be able to ambulate up/down 10 stairs with no increase in bilat knee pain for improve mobility and comfort    Baseline unable  without pain    Time 8    Period Weeks    Status New    Target Date 07/20/21                   Plan - 06/01/21 1152     Clinical Impression Statement Session focused on progression of hip/knee strengthening today as her knee pain is her biggest compliant currently. Occasional hamstring cramping with hip bridge that resolved with rest. She reports more difficulty with hip abductor/flexor strengthening on the Rt vs. the Lt with visible shaking in the RLE with SLR and sidelying hip abduction. She reports feeling as though her Rt leg is shorter, though upon assessment no LLD noted. Occasional complaints of knee pain with hamstring curl and LAQ, otherwise  she tolerated session well.    PT Treatment/Interventions ADLs/Self Care Home Management;Cryotherapy;Moist Heat;Electrical Stimulation;Therapeutic activities;Therapeutic exercise;Neuromuscular re-education;Patient/family education;Manual techniques;Dry needling;Vasopneumatic Device;Taping;Stair training;Gait training;Functional mobility training;Balance training    PT Next Visit Plan progress LE strengthening as able; also progress periscapular strengthening; assess resposne to new HEP    PT Home Exercise Plan Access Code: [email or web address]    Consulted and Agree with Plan of Care Patient             Patient will benefit from skilled therapeutic intervention in order to improve the following deficits and impairments:  Decreased endurance, Decreased range of motion, Decreased mobility, Decreased strength, Impaired sensation, Impaired UE functional use, Pain, Difficulty walking, Decreased activity tolerance  Visit Diagnosis: Muscle weakness (generalized)  Stiffness of right shoulder, not elsewhere classified     Problem List Patient Active Problem List   Diagnosis Date Noted   Morbid obesity, unspecified obesity type ([place]) 01/05/2021   Allergic rhinitis 08/16/2019   Chronic pain of right knee 08/16/2019   Prediabetes 08/16/2019   Essential hypertension 03/05/2018   Preventative health care 10/12/2011   OBESITY 08/31/2008   GEN OSTEOARTHROSIS INVOLVING MULTIPLE SITES 08/31/2008   DEPRESSION, MAJOR, RECURRENT 12/13/2006   Gwendolyn Grant, PT, DPT, ATC 06/01/21 12:29 PM   [facility] [address] [place], [place], [address] Phone: [phone]   Fax:  [phone]  Name: Doris Green MRN: [id number] Date of Birth: 05/18/1956

## 2021-06-06 ENCOUNTER — Ambulatory Visit: Payer: Medicare Other

## 2021-06-06 ENCOUNTER — Other Ambulatory Visit: Payer: Self-pay

## 2021-06-06 DIAGNOSIS — M6281 Muscle weakness (generalized): Secondary | ICD-10-CM | POA: Diagnosis not present

## 2021-06-06 DIAGNOSIS — M25611 Stiffness of right shoulder, not elsewhere classified: Secondary | ICD-10-CM

## 2021-06-06 NOTE — Therapy (Signed)
[place], [place], [address] Phone: [phone]   Fax:  [phone]  Physical Therapy Treatment  Patient Details  Name: Doris Green MRN: [id number] Date of Birth: 1956/01/31 Referring Provider (PT): Vevelyn Francois, NP   Encounter Date: 06/06/2021   PT End of Session - 06/06/21 1146     Visit Number 12    Number of Visits 17    Date for PT Re-Evaluation 07/20/21    Authorization Type MCR - FOTO 6th and 10th    Progress Note Due on Visit 19    PT Start Time 1146    PT Stop Time 1228    PT Time Calculation (min) 42 min    Activity Tolerance Patient tolerated treatment well    Behavior During Therapy [facility] for tasks assessed/performed             Past Medical History:  Diagnosis Date   Allergy    Arthritis    Depression    History of blood transfusion    Spider bite    2016 and 2017   UTERINE FIBROID 12/13/2006    Past Surgical History:  Procedure Laterality Date   SHOULDER SURGERY     TUBAL LIGATION      There were no vitals filed for this visit.   Subjective Assessment - 06/06/21 1147     Subjective She reports the shoulder and knee were hurting a little bit because of the weather. Not much pain right now. She has intermittent tingling in fingertips digits 2-5.  She reports intermittent compliance with HEP. She wants to focus on the shoulder today.    Currently in Pain? Yes    Pain Score 2     Pain Location Knee    Pain Orientation Right;Anterior;Medial    Pain Descriptors / Indicators Aching    Pain Type Chronic pain    Pain Onset More than a month ago    Pain Frequency Constant    Multiple Pain Sites Yes    Pain Score 1    Pain Location Shoulder    Pain Orientation Right    Pain Descriptors / Indicators --   stiffness   Pain Type Chronic pain    Pain Onset More than a month ago    Pain Frequency Intermittent                               [facility]  Adult PT Treatment/Exercise - 06/06/21 0001       Knee/Hip Exercises: Aerobic   Nustep level 5 x 5 min UE/LE      Shoulder Exercises: Supine   Protraction 15 reps    Protraction Weight (lbs) 3    Protraction Limitations x2      Shoulder Exercises: Prone   Other Prone Exercises prone T 2 x 10    Other Prone Exercises prone row 2 x 10; 3#      Shoulder Exercises: Standing   Extension 15 reps    Theraband Level (Shoulder Extension) Level 2 (Red)    Extension Limitations x2    Diagonals 10 reps    Theraband Level (Shoulder Diagonals) Level 2 (Red)    Diagonals Limitations D2 extension; bilateral    Other Standing Exercises weight ball taps in 1/2 circle 5 reps bilateral      Shoulder Exercises: ROM/Strengthening   Other ROM/Strengthening Exercises stability ball wall walkup 1  x10 red ball  PT Short Term Goals - 05/05/21 1243       PT SHORT TERM GOAL #1   Title Pt will be knowledge and compliant with initial HEP in order to improve comfort and carryover    Baseline initial HEP given    Time 3    Period Weeks    Status Achieved    Target Date 05/15/21      PT SHORT TERM GOAL #2   Title Pt will self report R shoulder pain no greater than 7/10 at worst in order to improve comfort and function    Baseline 10/10    Time 3    Period Weeks    Status Achieved    Target Date 05/15/21               PT Long Term Goals - 05/25/21 1343       PT LONG TERM GOAL #1   Title Pt will improve FOTO score to predicted value as a proxy for functional improvement    Baseline see flowsheet    Time 8    Period Weeks    Status New    Target Date 07/20/21      PT LONG TERM GOAL #2   Title Pt will improve R shoulder flex/abd AROM to no less than 150 degrees in order to improve functional ability    Baseline see flowsheet    Time 8    Period Weeks    Status New    Target Date 07/20/21      PT LONG TERM GOAL #3   Title Pt will be able to reach  into cabinets not limited by pain for improved comfort and function    Baseline unable    Time 8    Period Weeks    Status New    Target Date 07/20/21      PT LONG TERM GOAL #4   Title Pt will self report R shoulder pain no greater than 4/10 at worst in order to improve comfort    Baseline 10/10    Time 8    Period Weeks    Status New    Target Date 07/20/21      PT LONG TERM GOAL #5   Title Pt will increase reps in 30 sec STS to no less than 15 with no increase in R knee pain in order to improve balance and mobility    Baseline 10 reps    Time 8    Period Weeks    Status New    Target Date 07/20/21      Additional Long Term Goals   Additional Long Term Goals Yes      PT LONG TERM GOAL #6   Title Pt will improve LEFS score to no less than 35/80 in order to improve confidence and function    Baseline 21/80    Time 8    Period Weeks    Status New    Target Date 07/20/21      PT LONG TERM GOAL #7   Title Pt will be able to ambulate up/down 10 stairs with no increase in bilat knee pain for improve mobility and comfort    Baseline unable without pain    Time 8    Period Weeks    Status New    Target Date 07/20/21                   Plan - 06/06/21 1156  Clinical Impression Statement Session focused primarily on the shoulder as patient requested at the beginning of session. Able to introduce prone periscapular strengthening with patient having difficulty initially reducing upper trap engagement, though with continued reps and tactile cues she is able to properly engage. Withe standing periscapular strengthening she is better able to maintain good posture without notable shoulder shrug. She reported less stiffness in her shoulder at the end of the session.    PT Treatment/Interventions ADLs/Self Care Home Management;Cryotherapy;Moist Heat;Electrical Stimulation;Therapeutic activities;Therapeutic exercise;Neuromuscular re-education;Patient/family education;Manual  techniques;Dry needling;Vasopneumatic Device;Taping;Stair training;Gait training;Functional mobility training;Balance training    PT Next Visit Plan progress LE strengthening as able; also progress periscapular strengthening; assess resposne to new HEP    PT Home Exercise Plan Access Code: [email or web address]    Consulted and Agree with Plan of Care Patient             Patient will benefit from skilled therapeutic intervention in order to improve the following deficits and impairments:  Decreased endurance, Decreased range of motion, Decreased mobility, Decreased strength, Impaired sensation, Impaired UE functional use, Pain, Difficulty walking, Decreased activity tolerance  Visit Diagnosis: Muscle weakness (generalized)  Stiffness of right shoulder, not elsewhere classified     Problem List Patient Active Problem List   Diagnosis Date Noted   Morbid obesity, unspecified obesity type ([place]) 01/05/2021   Allergic rhinitis 08/16/2019   Chronic pain of right knee 08/16/2019   Prediabetes 08/16/2019   Essential hypertension 03/05/2018   Preventative health care 10/12/2011   OBESITY 08/31/2008   GEN OSTEOARTHROSIS INVOLVING MULTIPLE SITES 08/31/2008   DEPRESSION, MAJOR, RECURRENT 12/13/2006   Gwendolyn Grant, PT, DPT, ATC 06/06/21 12:33 PM  [facility] [address] [place], [place], [address] Phone: [phone]   Fax:  [phone]  Name: Doris Green MRN: [id number] Date of Birth: 1955-11-05

## 2021-06-08 ENCOUNTER — Ambulatory Visit: Payer: Medicare Other

## 2021-06-08 ENCOUNTER — Other Ambulatory Visit: Payer: Self-pay

## 2021-06-08 DIAGNOSIS — M6281 Muscle weakness (generalized): Secondary | ICD-10-CM | POA: Diagnosis not present

## 2021-06-08 DIAGNOSIS — M25611 Stiffness of right shoulder, not elsewhere classified: Secondary | ICD-10-CM

## 2021-06-08 NOTE — Therapy (Signed)
[place], [place], [address] Phone: [phone]   Fax:  [phone]  Physical Therapy Treatment  Patient Details  Name: Doris Green MRN: [id number] Date of Birth: Jun 22, 1956 Referring Provider (PT): Vevelyn Francois, NP   Encounter Date: 06/08/2021   PT End of Session - 06/08/21 1209     Visit Number 13    Number of Visits 17    Date for PT Re-Evaluation 07/20/21    Authorization Type MCR - FOTO 6th and 10th    Progress Note Due on Visit 19    PT Start Time 1210    PT Stop Time 1250    PT Time Calculation (min) 40 min    Activity Tolerance Patient tolerated treatment well    Behavior During Therapy [facility] for tasks assessed/performed             Past Medical History:  Diagnosis Date   Allergy    Arthritis    Depression    History of blood transfusion    Spider bite    2016 and 2017   UTERINE FIBROID 12/13/2006    Past Surgical History:  Procedure Laterality Date   SHOULDER SURGERY     TUBAL LIGATION      There were no vitals filed for this visit.   Subjective Assessment - 06/08/21 1210     Subjective Pt presents to PT with reports of shoulder and knee pain. Pt notes that her shoulder pain is increased today after she tripped a few days ago and hit shoulder into wall. Pt has been fairly compliant still with her HEP. She is ready to begin PT at this time.    Currently in Pain? Yes    Pain Score 3     Pain Location Knee    Pain Orientation Right;Anterior    Pain Score 4    Pain Location Shoulder    Pain Orientation Right           [facility] Adult PT Treatment/Exercise:   Therapeutic Exercise:  NuStep lvl 5 UE/LE x 5 min while taking subjective Row 2x15 10lbs Shoulder IR/ER 2x10 3lbs ea Lateral walk RTB x 3 laps in // Standing hip ext 2x10 RTB ea STS holding 2x10 holding 5lb DB Seated bilat ER w/ scap retrac 2x10 GTB Seated horizontal abd GTB  2x15                              PT Short Term Goals - 05/05/21 1243       PT SHORT TERM GOAL #1   Title Pt will be knowledge and compliant with initial HEP in order to improve comfort and carryover    Baseline initial HEP given    Time 3    Period Weeks    Status Achieved    Target Date 05/15/21      PT SHORT TERM GOAL #2   Title Pt will self report R shoulder pain no greater than 7/10 at worst in order to improve comfort and function    Baseline 10/10    Time 3    Period Weeks    Status Achieved    Target Date 05/15/21               PT Long Term Goals - 05/25/21 1343       PT LONG TERM GOAL #1   Title Pt will improve FOTO score to predicted  value as a proxy for functional improvement    Baseline see flowsheet    Time 8    Period Weeks    Status New    Target Date 07/20/21      PT LONG TERM GOAL #2   Title Pt will improve R shoulder flex/abd AROM to no less than 150 degrees in order to improve functional ability    Baseline see flowsheet    Time 8    Period Weeks    Status New    Target Date 07/20/21      PT LONG TERM GOAL #3   Title Pt will be able to reach into cabinets not limited by pain for improved comfort and function    Baseline unable    Time 8    Period Weeks    Status New    Target Date 07/20/21      PT LONG TERM GOAL #4   Title Pt will self report R shoulder pain no greater than 4/10 at worst in order to improve comfort    Baseline 10/10    Time 8    Period Weeks    Status New    Target Date 07/20/21      PT LONG TERM GOAL #5   Title Pt will increase reps in 30 sec STS to no less than 15 with no increase in R knee pain in order to improve balance and mobility    Baseline 10 reps    Time 8    Period Weeks    Status New    Target Date 07/20/21      Additional Long Term Goals   Additional Long Term Goals Yes      PT LONG TERM GOAL #6   Title Pt will improve LEFS score to no less than 35/80 in order to  improve confidence and function    Baseline 21/80    Time 8    Period Weeks    Status New    Target Date 07/20/21      PT LONG TERM GOAL #7   Title Pt will be able to ambulate up/down 10 stairs with no increase in bilat knee pain for improve mobility and comfort    Baseline unable without pain    Time 8    Period Weeks    Status New    Target Date 07/20/21                   Plan - 06/08/21 1215     Clinical Impression Statement Pt was once again able to complete prescribed exercises with no adverse effect. Today's session once again focused on increasing periscapular and LE strength in order to reduce shoulder and knee pain. Pt continues to progress well with therapy, showing improving strength and general activity tolerance. PT will continue to progress exercises as tolerated per POC.    PT Treatment/Interventions ADLs/Self Care Home Management;Cryotherapy;Moist Heat;Electrical Stimulation;Therapeutic activities;Therapeutic exercise;Neuromuscular re-education;Patient/family education;Manual techniques;Dry needling;Vasopneumatic Device;Taping;Stair training;Gait training;Functional mobility training;Balance training    PT Next Visit Plan progress LE strengthening as able; also progress periscapular strengthening    PT Home Exercise Plan Access Code: [email or web address]             Patient will benefit from skilled therapeutic intervention in order to improve the following deficits and impairments:  Decreased endurance, Decreased range of motion, Decreased mobility, Decreased strength, Impaired sensation, Impaired UE functional use, Pain, Difficulty walking, Decreased activity tolerance  Visit Diagnosis: Muscle weakness (generalized)  Stiffness of right shoulder, not elsewhere classified     Problem List Patient Active Problem List   Diagnosis Date Noted   Morbid obesity, unspecified obesity type ([place]) 01/05/2021   Allergic rhinitis 08/16/2019   Chronic pain of right knee  08/16/2019   Prediabetes 08/16/2019   Essential hypertension 03/05/2018   Preventative health care 10/12/2011   OBESITY 08/31/2008   GEN OSTEOARTHROSIS INVOLVING MULTIPLE SITES 08/31/2008   DEPRESSION, MAJOR, RECURRENT 12/13/2006    Ward Chatters, PT, DPT 06/08/21 12:51 PM  [facility] [address] [place], [place], [address] Phone: [phone]   Fax:  [phone]  Name: Novalie Kronberg MRN: [id number] Date of Birth: 1956/07/24

## 2021-06-13 ENCOUNTER — Ambulatory Visit: Payer: Medicare Other

## 2021-06-15 ENCOUNTER — Ambulatory Visit: Payer: Medicare Other

## 2021-06-15 ENCOUNTER — Other Ambulatory Visit: Payer: Self-pay

## 2021-06-15 DIAGNOSIS — M6281 Muscle weakness (generalized): Secondary | ICD-10-CM

## 2021-06-15 DIAGNOSIS — M25611 Stiffness of right shoulder, not elsewhere classified: Secondary | ICD-10-CM

## 2021-06-15 NOTE — Therapy (Signed)
[place], [place], [address] Phone: [phone]   Fax:  [phone]  Physical Therapy Treatment  Patient Details  Name: Doris Green MRN: [id number] Date of Birth: 1955/12/31 Referring Provider (PT): Vevelyn Francois, NP   Encounter Date: 06/15/2021   PT End of Session - 06/15/21 1210     Visit Number 14    Number of Visits 17    Date for PT Re-Evaluation 07/20/21    Authorization Type MCR - FOTO 6th and 10th    Progress Note Due on Visit 19    PT Start Time 1212    PT Stop Time 1253    PT Time Calculation (min) 41 min    Activity Tolerance Patient tolerated treatment well    Behavior During Therapy [facility] for tasks assessed/performed             Past Medical History:  Diagnosis Date   Allergy    Arthritis    Depression    History of blood transfusion    Spider bite    2016 and 2017   UTERINE FIBROID 12/13/2006    Past Surgical History:  Procedure Laterality Date   SHOULDER SURGERY     TUBAL LIGATION      There were no vitals filed for this visit.   Subjective Assessment - 06/15/21 1210     Subjective Pt presents to PT with continued reports of R knee pain. She notest that she has been compliant with her HEP with no adverse effects. Pt is ready to begin PT treatment at this time.    Currently in Pain? Yes    Pain Score 3     Pain Location Knee    Pain Orientation Right           [facility] Adult PT Treatment/Exercise:   Therapeutic Exercise:  NuStep lvl 5 UE/LE x 5 min while taking subjective Lateral walk GTB x 3 laps in // Standing hip abd/ext 2x10 ea GTB Step up 8in 2x10 - 1 UE support Supine SLR 2x10 ea S/L clam 2x10 GTB Leg press 3x10 55lbs  Not Performed Today Row 2x15 10lbs Shoulder IR/ER 2x10 3lbs ea Lateral walk RTB x 3 laps in // Standing hip ext 2x10 RTB ea STS holding 2x10 holding 5lb DB Seated bilat ER w/ scap retrac 2x10 GTB Seated horizontal abd GTB  2x15                              PT Short Term Goals - 05/05/21 1243       PT SHORT TERM GOAL #1   Title Pt will be knowledge and compliant with initial HEP in order to improve comfort and carryover    Baseline initial HEP given    Time 3    Period Weeks    Status Achieved    Target Date 05/15/21      PT SHORT TERM GOAL #2   Title Pt will self report R shoulder pain no greater than 7/10 at worst in order to improve comfort and function    Baseline 10/10    Time 3    Period Weeks    Status Achieved    Target Date 05/15/21               PT Long Term Goals - 05/25/21 1343       PT LONG TERM GOAL #1   Title Pt will  improve FOTO score to predicted value as a proxy for functional improvement    Baseline see flowsheet    Time 8    Period Weeks    Status New    Target Date 07/20/21      PT LONG TERM GOAL #2   Title Pt will improve R shoulder flex/abd AROM to no less than 150 degrees in order to improve functional ability    Baseline see flowsheet    Time 8    Period Weeks    Status New    Target Date 07/20/21      PT LONG TERM GOAL #3   Title Pt will be able to reach into cabinets not limited by pain for improved comfort and function    Baseline unable    Time 8    Period Weeks    Status New    Target Date 07/20/21      PT LONG TERM GOAL #4   Title Pt will self report R shoulder pain no greater than 4/10 at worst in order to improve comfort    Baseline 10/10    Time 8    Period Weeks    Status New    Target Date 07/20/21      PT LONG TERM GOAL #5   Title Pt will increase reps in 30 sec STS to no less than 15 with no increase in R knee pain in order to improve balance and mobility    Baseline 10 reps    Time 8    Period Weeks    Status New    Target Date 07/20/21      Additional Long Term Goals   Additional Long Term Goals Yes      PT LONG TERM GOAL #6   Title Pt will improve LEFS score to no less than 35/80 in order to  improve confidence and function    Baseline 21/80    Time 8    Period Weeks    Status New    Target Date 07/20/21      PT LONG TERM GOAL #7   Title Pt will be able to ambulate up/down 10 stairs with no increase in bilat knee pain for improve mobility and comfort    Baseline unable without pain    Time 8    Period Weeks    Status New    Target Date 07/20/21                   Plan - 06/15/21 1214     Clinical Impression Statement Pt was once again able to complete prescribed exercises with no increase in baseline pain or adverse effect. Today's session we focused on continued LE strengthening, especially knee and proximal hip, in order to improve functional mobility and decrease knee pain. She continues to have pain with loaded knee flexion on R LE, but is continuing to improve with therapy. PT will continue to progress as tolerated, with discussion with pt revealing she would like to focus mainly on decreasing knee pain for remaining sessions.    PT Frequency 1x / week    PT Duration 3 weeks    PT Treatment/Interventions ADLs/Self Care Home Management;Cryotherapy;Moist Heat;Electrical Stimulation;Therapeutic activities;Therapeutic exercise;Neuromuscular re-education;Patient/family education;Manual techniques;Dry needling;Vasopneumatic Device;Taping;Stair training;Gait training;Functional mobility training;Balance training    PT Next Visit Plan progress LE strengthening as able; also progress periscapular strengthening    PT Home Exercise Plan Access Code: [email or web address]  Patient will benefit from skilled therapeutic intervention in order to improve the following deficits and impairments:  Decreased endurance, Decreased range of motion, Decreased mobility, Decreased strength, Impaired sensation, Impaired UE functional use, Pain, Difficulty walking, Decreased activity tolerance  Visit Diagnosis: Muscle weakness (generalized)  Stiffness of right shoulder, not elsewhere  classified     Problem List Patient Active Problem List   Diagnosis Date Noted   Morbid obesity, unspecified obesity type ([place]) 01/05/2021   Allergic rhinitis 08/16/2019   Chronic pain of right knee 08/16/2019   Prediabetes 08/16/2019   Essential hypertension 03/05/2018   Preventative health care 10/12/2011   OBESITY 08/31/2008   GEN OSTEOARTHROSIS INVOLVING MULTIPLE SITES 08/31/2008   DEPRESSION, MAJOR, RECURRENT 12/13/2006    Ward Chatters, PT, DPT 06/15/21 1:00 PM  [facility] [address] [place], [place], [address] Phone: [phone]   Fax:  [phone]  Name: Doris Green MRN: [id number] Date of Birth: 06-May-1956

## 2021-06-25 ENCOUNTER — Ambulatory Visit: Payer: Medicare Other | Attending: Nurse Practitioner

## 2021-06-25 ENCOUNTER — Other Ambulatory Visit: Payer: Self-pay

## 2021-06-25 DIAGNOSIS — M25611 Stiffness of right shoulder, not elsewhere classified: Secondary | ICD-10-CM | POA: Insufficient documentation

## 2021-06-25 DIAGNOSIS — M6281 Muscle weakness (generalized): Secondary | ICD-10-CM | POA: Insufficient documentation

## 2021-06-25 NOTE — Therapy (Signed)
[place], [place], [address] Phone: [phone]   Fax:  [phone]  Physical Therapy Treatment  Patient Details  Name: Doris Green MRN: [id number] Date of Birth: [birth year] Referring Provider (PT): Vevelyn Francois, NP   Encounter Date: 06/25/2021   PT End of Session - 06/25/21 1028     Visit Number 15    Number of Visits 17    Date for PT Re-Evaluation 07/20/21    Authorization Type MCR - FOTO 6th and 10th    Progress Note Due on Visit 19    PT Start Time 1028    PT Stop Time 1109    PT Time Calculation (min) 41 min    Activity Tolerance Patient tolerated treatment well    Behavior During Therapy [facility] for tasks assessed/performed             Past Medical History:  Diagnosis Date   Allergy    Arthritis    Depression    History of blood transfusion    Spider bite    2016 and 2017   UTERINE FIBROID 12/13/2006    Past Surgical History:  Procedure Laterality Date   SHOULDER SURGERY     TUBAL LIGATION      There were no vitals filed for this visit.   Subjective Assessment - 06/25/21 1030     Subjective Patient reports the shoulder feels great, but her knee is acting up because of the rain. She reports compliance with HEP.    Currently in Pain? Yes    Pain Score 4     Pain Location Knee    Pain Orientation Right    Pain Descriptors / Indicators Sore;Aching    Pain Type Chronic pain    Pain Onset More than a month ago    Pain Frequency Intermittent    Aggravating Factors  weather, prolonged standing/walking    Pain Relieving Factors epsom salt              [facility] Adult PT Treatment/Exercise:   Therapeutic Exercise:  NuStep lvl 6 UE/LE x 5 min  Prone quad stretch 2 x 30 sec  Sidelying hip abduction 2 x 10 bilateral  Resisted knee flexion 2 x 10 @ 25 lbs  Resisted knee extension 2  x10 @ 25 lbs Calf stretch on slant board 60 sec  Marching on airex 2  x10  Mini Squat  with BUE support 2 x 10    Not Performed Today Lateral walk GTB x 3 laps in // Standing hip abd/ext 2x10 ea GTB Step up 8in 2x10 - 1 UE support Supine SLR 2x10 ea S/L clam 2x10 GTB Leg press 3x10 55lbs Row 2x15 10lbs Shoulder IR/ER 2x10 3lbs ea Lateral walk RTB x 3 laps in // Standing hip ext 2x10 RTB ea STS holding 2x10 holding 5lb DB Seated bilat ER w/ scap retrac 2x10 GTB Seated horizontal abd GTB 2x15                              PT Short Term Goals - 05/05/21 1243       PT SHORT TERM GOAL #1   Title Pt will be knowledge and compliant with initial HEP in order to improve comfort and carryover    Baseline initial HEP given    Time 3    Period Weeks    Status Achieved    Target Date 05/15/21  PT SHORT TERM GOAL #2   Title Pt will self report R shoulder pain no greater than 7/10 at worst in order to improve comfort and function    Baseline 10/10    Time 3    Period Weeks    Status Achieved    Target Date 05/15/21               PT Long Term Goals - 05/25/21 1343       PT LONG TERM GOAL #1   Title Pt will improve FOTO score to predicted value as a proxy for functional improvement    Baseline see flowsheet    Time 8    Period Weeks    Status New    Target Date 07/20/21      PT LONG TERM GOAL #2   Title Pt will improve R shoulder flex/abd AROM to no less than 150 degrees in order to improve functional ability    Baseline see flowsheet    Time 8    Period Weeks    Status New    Target Date 07/20/21      PT LONG TERM GOAL #3   Title Pt will be able to reach into cabinets not limited by pain for improved comfort and function    Baseline unable    Time 8    Period Weeks    Status New    Target Date 07/20/21      PT LONG TERM GOAL #4   Title Pt will self report R shoulder pain no greater than 4/10 at worst in order to improve comfort    Baseline 10/10    Time 8    Period Weeks    Status New    Target Date 07/20/21       PT LONG TERM GOAL #5   Title Pt will increase reps in 30 sec STS to no less than 15 with no increase in R knee pain in order to improve balance and mobility    Baseline 10 reps    Time 8    Period Weeks    Status New    Target Date 07/20/21      Additional Long Term Goals   Additional Long Term Goals Yes      PT LONG TERM GOAL #6   Title Pt will improve LEFS score to no less than 35/80 in order to improve confidence and function    Baseline 21/80    Time 8    Period Weeks    Status New    Target Date 07/20/21      PT LONG TERM GOAL #7   Title Pt will be able to ambulate up/down 10 stairs with no increase in bilat knee pain for improve mobility and comfort    Baseline unable without pain    Time 8    Period Weeks    Status New    Target Date 07/20/21                   Plan - 06/25/21 1037     Clinical Impression Statement Session continued to focus on addressing her impairments as it relates to her chronic knee pain. She reports a "spongy" sensation about the Rt knee when performing resisted knee flexion, but not with resisted knee extension. Initiated strengthening on unstable surface with patient demonstrating moderate postural sway and lateral trunk lean that is more apparent during SLS on the RLE. She was able to complete mini squat,  though when attempts further range she reports knee pain. Overall good tolerance to today' session reporting less pain and tightness at end of session.    PT Frequency --    PT Duration --    PT Treatment/Interventions ADLs/Self Care Home Management;Cryotherapy;Moist Heat;Electrical Stimulation;Therapeutic activities;Therapeutic exercise;Neuromuscular re-education;Patient/family education;Manual techniques;Dry needling;Vasopneumatic Device;Taping;Stair training;Gait training;Functional mobility training;Balance training    PT Next Visit Plan progress LE strengthening as able; also progress periscapular strengthening    PT Home Exercise  Plan Access Code: [email or web address]    Consulted and Agree with Plan of Care Patient             Patient will benefit from skilled therapeutic intervention in order to improve the following deficits and impairments:  Decreased endurance, Decreased range of motion, Decreased mobility, Decreased strength, Impaired sensation, Impaired UE functional use, Pain, Difficulty walking, Decreased activity tolerance  Visit Diagnosis: Muscle weakness (generalized)  Stiffness of right shoulder, not elsewhere classified     Problem List Patient Active Problem List   Diagnosis Date Noted   Morbid obesity, unspecified obesity type ([place]) 01/05/2021   Allergic rhinitis 08/16/2019   Chronic pain of right knee 08/16/2019   Prediabetes 08/16/2019   Essential hypertension 03/05/2018   Preventative health care 10/12/2011   OBESITY 08/31/2008   GEN OSTEOARTHROSIS INVOLVING MULTIPLE SITES 08/31/2008   DEPRESSION, MAJOR, RECURRENT 12/13/2006   Gwendolyn Grant, PT, DPT, ATC 06/25/21 11:11 AM   [place] [facility] [address] [place], [place], [address] Phone: [phone]   Fax:  [phone]  Name: Doris Green MRN: [id number] Date of Birth: 12-Mar-1956

## 2021-06-29 ENCOUNTER — Ambulatory Visit: Payer: Medicare Other

## 2021-06-29 ENCOUNTER — Other Ambulatory Visit: Payer: Self-pay

## 2021-06-29 DIAGNOSIS — M6281 Muscle weakness (generalized): Secondary | ICD-10-CM

## 2021-06-29 DIAGNOSIS — M25611 Stiffness of right shoulder, not elsewhere classified: Secondary | ICD-10-CM

## 2021-06-29 NOTE — Therapy (Signed)
[place], [place], [address] Phone: [phone]   Fax:  [phone]  Physical Therapy Treatment  Patient Details  Name: Doris Green MRN: [id number] Date of Birth: 07-May-1956 Referring Provider (PT): Vevelyn Francois, NP   Encounter Date: 06/29/2021   PT End of Session - 06/29/21 1316     Visit Number 16    Number of Visits 17    Date for PT Re-Evaluation 07/20/21    Authorization Type MCR - FOTO 6th and 10th    Progress Note Due on Visit 19    PT Start Time 1316    PT Stop Time 1358    PT Time Calculation (min) 42 min    Activity Tolerance Patient tolerated treatment well    Behavior During Therapy [facility] for tasks assessed/performed             Past Medical History:  Diagnosis Date   Allergy    Arthritis    Depression    History of blood transfusion    Spider bite    2016 and 2017   UTERINE FIBROID 12/13/2006    Past Surgical History:  Procedure Laterality Date   SHOULDER SURGERY     TUBAL LIGATION      There were no vitals filed for this visit.   Subjective Assessment - 06/29/21 1317     Subjective The shoulder and the knee are a little achey because of the weather. She had soreness after last session that lasted about a day. She would like to focus on her shoulder today.    Currently in Pain? Yes    Pain Score 3     Pain Location Knee    Pain Orientation Right    Pain Descriptors / Indicators Aching    Pain Type Chronic pain    Pain Onset More than a month ago    Pain Frequency Intermittent    Multiple Pain Sites Yes    Pain Score 3    Pain Location Shoulder    Pain Orientation Right    Pain Descriptors / Indicators Aching    Pain Type Chronic pain    Pain Onset More than a month ago    Pain Frequency Intermittent                [facility] Adult PT Treatment/Exercise:   Therapeutic Exercise:  NuStep lvl 6 UE/LE x 5 min  Pec doorway stretch 3 x 30 sec  Supine  serratus punch 3# 2 x 15  Sidelying ER RUE 2 x 10 3#  Wall ball circles CW/CCW in flexion and abduction 2 x 10  Resisted shoulder extension 2 x 15 green band  Resisted shoulder adduction green band 2 x 10  Standing scaption 2 x 10 @ 2lbs  Standing shoulder flexion 2 x 10 @ 2lbs Horizontal abduction red band 2 x 10    Not Performed Today Prone quad stretch 2 x 30 sec  Sidelying hip abduction 2 x 10 bilateral  Resisted knee flexion 2 x 10 @ 25 lbs  Resisted knee extension 2  x10 @ 25 lbs Calf stretch on slant board 60 sec  Marching on airex 2  x10  Mini Squat with BUE support 2 x 10  Lateral walk GTB x 3 laps in // Standing hip abd/ext 2x10 ea GTB Step up 8in 2x10 - 1 UE support Supine SLR 2x10 ea S/L clam 2x10 GTB Leg press 3x10 55lbs Row 2x15 10lbs Shoulder IR/ER  2x10 3lbs ea Lateral walk RTB x 3 laps in // Standing hip ext 2x10 RTB ea STS holding 2x10 holding 5lb DB Seated bilat ER w/ scap retrac 2x10 GTB Seated horizontal abd GTB 2x15                                PT Short Term Goals - 05/05/21 1243       PT SHORT TERM GOAL #1   Title Pt will be knowledge and compliant with initial HEP in order to improve comfort and carryover    Baseline initial HEP given    Time 3    Period Weeks    Status Achieved    Target Date 05/15/21      PT SHORT TERM GOAL #2   Title Pt will self report R shoulder pain no greater than 7/10 at worst in order to improve comfort and function    Baseline 10/10    Time 3    Period Weeks    Status Achieved    Target Date 05/15/21               PT Long Term Goals - 05/25/21 1343       PT LONG TERM GOAL #1   Title Pt will improve FOTO score to predicted value as a proxy for functional improvement    Baseline see flowsheet    Time 8    Period Weeks    Status New    Target Date 07/20/21      PT LONG TERM GOAL #2   Title Pt will improve R shoulder flex/abd AROM to no less than 150 degrees in order to  improve functional ability    Baseline see flowsheet    Time 8    Period Weeks    Status New    Target Date 07/20/21      PT LONG TERM GOAL #3   Title Pt will be able to reach into cabinets not limited by pain for improved comfort and function    Baseline unable    Time 8    Period Weeks    Status New    Target Date 07/20/21      PT LONG TERM GOAL #4   Title Pt will self report R shoulder pain no greater than 4/10 at worst in order to improve comfort    Baseline 10/10    Time 8    Period Weeks    Status New    Target Date 07/20/21      PT LONG TERM GOAL #5   Title Pt will increase reps in 30 sec STS to no less than 15 with no increase in R knee pain in order to improve balance and mobility    Baseline 10 reps    Time 8    Period Weeks    Status New    Target Date 07/20/21      Additional Long Term Goals   Additional Long Term Goals Yes      PT LONG TERM GOAL #6   Title Pt will improve LEFS score to no less than 35/80 in order to improve confidence and function    Baseline 21/80    Time 8    Period Weeks    Status New    Target Date 07/20/21      PT LONG TERM GOAL #7   Title Pt will be able to ambulate up/down 10  stairs with no increase in bilat knee pain for improve mobility and comfort    Baseline unable without pain    Time 8    Period Weeks    Status New    Target Date 07/20/21                   Plan - 06/29/21 1325     Clinical Impression Statement Patient reports mild ache in the shoulder and knee upon arrival that she attributes to the weather. She requested that today's session focus on her shoulder. She tolerated session well today with progression of shoulder and periscapular strengthening without reports of increased pain. Intermittent postural cues to decrease shoulder shrug required throughout session. She rated her shoulder pain as 1/10 at end of session.    PT Treatment/Interventions ADLs/Self Care Home Management;Cryotherapy;Moist  Heat;Electrical Stimulation;Therapeutic activities;Therapeutic exercise;Neuromuscular re-education;Patient/family education;Manual techniques;Dry needling;Vasopneumatic Device;Taping;Stair training;Gait training;Functional mobility training;Balance training    PT Next Visit Plan re-eval; progress LE strengthening as able; also progress periscapular strengthening; KX modifier    PT Home Exercise Plan Access Code: [email or web address]    Consulted and Agree with Plan of Care Patient             Patient will benefit from skilled therapeutic intervention in order to improve the following deficits and impairments:  Decreased endurance, Decreased range of motion, Decreased mobility, Decreased strength, Impaired sensation, Impaired UE functional use, Pain, Difficulty walking, Decreased activity tolerance  Visit Diagnosis: Muscle weakness (generalized)  Stiffness of right shoulder, not elsewhere classified     Problem List Patient Active Problem List   Diagnosis Date Noted   Morbid obesity, unspecified obesity type ([place]) 01/05/2021   Allergic rhinitis 08/16/2019   Chronic pain of right knee 08/16/2019   Prediabetes 08/16/2019   Essential hypertension 03/05/2018   Preventative health care 10/12/2011   OBESITY 08/31/2008   GEN OSTEOARTHROSIS INVOLVING MULTIPLE SITES 08/31/2008   DEPRESSION, MAJOR, RECURRENT 12/13/2006   Gwendolyn Grant, PT, DPT, ATC 06/29/21 2:00 PM   [facility] [address] [place], [place], [address] Phone: [phone]   Fax:  [phone]  Name: Doris Green MRN: [id number] Date of Birth: 27-Jan-1956

## 2021-07-06 ENCOUNTER — Ambulatory Visit: Payer: Medicare Other

## 2021-07-06 ENCOUNTER — Other Ambulatory Visit: Payer: BC Managed Care – PPO

## 2021-07-06 ENCOUNTER — Other Ambulatory Visit: Payer: Self-pay

## 2021-07-06 DIAGNOSIS — M25611 Stiffness of right shoulder, not elsewhere classified: Secondary | ICD-10-CM

## 2021-07-06 DIAGNOSIS — M6281 Muscle weakness (generalized): Secondary | ICD-10-CM

## 2021-07-06 NOTE — Therapy (Signed)
[place], [place], [address] Phone: [phone]   Fax:  [phone]  Physical Therapy Treatment/Discharge  Patient Details  Name: Doris Green MRN: [id number] Date of Birth: 1956-05-02 Referring Provider (PT): Vevelyn Francois, NP   Encounter Date: 07/06/2021   PT End of Session - 07/06/21 1209     Visit Number 17    Number of Visits 17    Date for PT Re-Evaluation 07/20/21    Authorization Type MCR - FOTO 6th and 10th    Progress Note Due on Visit 19    PT Start Time 1215    PT Stop Time 1253    PT Time Calculation (min) 38 min    Activity Tolerance Patient tolerated treatment well    Behavior During Therapy [facility] for tasks assessed/performed             Past Medical History:  Diagnosis Date   Allergy    Arthritis    Depression    History of blood transfusion    Spider bite    2016 and 2017   UTERINE FIBROID 12/13/2006    Past Surgical History:  Procedure Laterality Date   SHOULDER SURGERY     TUBAL LIGATION      There were no vitals filed for this visit.   Subjective Assessment - 07/06/21 1210     Subjective Pt presents to PT with reports that her shoulders are feeling good overall. She does have continued knee discomfort, but notes this seems to be activity dependent. Ready to begin PT treatment at this time.    Currently in Pain? Yes    Pain Score 1     Pain Location Knee    Pain Orientation Right           [facility] Adult PT Treatment/Exercise:   Therapeutic Exercise:  NuStep lvl 6 UE/LE x 5 min  STS x 10 Row black tband x 15 Bilat shoulder IR/ER x 10 red tband Corner stretch x 30 sec Horizontal abduction red band 2 x 10  Bridge x 10  SLR x 10  S/L clamshell x 10 green tband     [facility] PT Assessment - 07/06/21 0001       Observation/Other Assessments   Focus on Therapeutic Outcomes (FOTO)  67% function    Lower Extremity Functional Scale  32/80      AROM    Right Shoulder Flexion 140 Degrees    Right Shoulder ABduction 135 Degrees    Right Shoulder Internal Rotation --   L2   Right Shoulder External Rotation --   behind head   Left Shoulder Flexion 170 Degrees    Left Shoulder ABduction 170 Degrees    Left Shoulder Internal Rotation --   L2   Left Shoulder External Rotation --   behind head                                   PT Education - 07/06/21 1257     Education Details HEP and discharge planning    Person(s) Educated Patient    Methods Explanation;Demonstration;Handout    Comprehension Verbalized understanding;Returned demonstration              PT Short Term Goals - 05/05/21 1243       PT SHORT TERM GOAL #1   Title Pt will be knowledge and compliant with initial HEP in order  to improve comfort and carryover    Baseline initial HEP given    Time 3    Period Weeks    Status Achieved    Target Date 05/15/21      PT SHORT TERM GOAL #2   Title Pt will self report R shoulder pain no greater than 7/10 at worst in order to improve comfort and function    Baseline 10/10    Time 3    Period Weeks    Status Achieved    Target Date 05/15/21               PT Long Term Goals - 07/06/21 1222       PT LONG TERM GOAL #1   Title Pt will improve FOTO score to predicted value as a proxy for functional improvement    Baseline see flowsheet - final score 67% function    Time 8    Period Weeks    Status Achieved      PT LONG TERM GOAL #2   Title Pt will improve R shoulder flex/abd AROM to no less than 150 degrees in order to improve functional ability    Baseline see flowsheet    Time 8    Period Weeks    Status Partially Met      PT LONG TERM GOAL #3   Title Pt will be able to reach into cabinets not limited by pain for improved comfort and function    Baseline unable    Time 8    Period Weeks    Status Achieved      PT LONG TERM GOAL #4   Title Pt will self report R shoulder pain no  greater than 4/10 at worst in order to improve comfort    Baseline 10/10    Time 8    Period Weeks    Status Achieved      PT LONG TERM GOAL #5   Title Pt will increase reps in 30 sec STS to no less than 15 with no increase in R knee pain in order to improve balance and mobility    Baseline 10 reps - 07/06/21 10 reps    Time 8    Period Weeks    Status Not Met      PT LONG TERM GOAL #6   Title Pt will improve LEFS score to no less than 35/80 in order to improve confidence and function    Baseline 21/80; 32/80 on 07/06/21    Time 8    Period Weeks    Status Partially Met      PT LONG TERM GOAL #7   Title Pt will be able to ambulate up/down 10 stairs with no increase in bilat knee pain for improve mobility and comfort    Baseline unable without pain    Time 8    Period Weeks    Status Achieved                   Plan - 07/06/21 1411     Clinical Impression Statement Pt able to complete prescribed exercises with no adverse effect and demonstrated knowledge of HEP. Over the course of PT treatment she did very well working on improving functional ability pain in bilat shoulders. She has met all LTGs regarding UE function and FOTO, but has plateau'd meeting her LTGs for decreasing R knee pain. She should continue to improve with HEP compliance for continued decrease in pain and improving strength.  PT Treatment/Interventions ADLs/Self Care Home Management;Cryotherapy;Moist Heat;Electrical Stimulation;Therapeutic activities;Therapeutic exercise;Neuromuscular re-education;Patient/family education;Manual techniques;Dry needling;Vasopneumatic Device;Taping;Stair training;Gait training;Functional mobility training;Balance training    PT Home Exercise Plan Access Code: [email or web address]    Consulted and Agree with Plan of Care Patient             Patient will benefit from skilled therapeutic intervention in order to improve the following deficits and impairments:  Decreased endurance,  Decreased range of motion, Decreased mobility, Decreased strength, Impaired sensation, Impaired UE functional use, Pain, Difficulty walking, Decreased activity tolerance  Visit Diagnosis: Muscle weakness (generalized)  Stiffness of right shoulder, not elsewhere classified     Problem List Patient Active Problem List   Diagnosis Date Noted   Morbid obesity, unspecified obesity type ([place]) 01/05/2021   Allergic rhinitis 08/16/2019   Chronic pain of right knee 08/16/2019   Prediabetes 08/16/2019   Essential hypertension 03/05/2018   Preventative health care 10/12/2011   OBESITY 08/31/2008   GEN OSTEOARTHROSIS INVOLVING MULTIPLE SITES 08/31/2008   DEPRESSION, MAJOR, RECURRENT 12/13/2006    Ward Chatters, PT 07/06/2021, 2:20 PM  [facility] [address] [place], [place], [address] Phone: [phone]   Fax:  [phone]  Name: Doris Green MRN: [id number] Date of Birth: 1956-04-23   PHYSICAL THERAPY DISCHARGE SUMMARY  Visits from Start of Care: 17  Current functional level related to goals / functional outcomes: See goals and objective   Remaining deficits: See goals and objective   Education / Equipment: See goals and objective   Patient agrees to discharge. Patient goals were met. Patient is being discharged due to being pleased with the current functional level.

## 2021-08-19 ENCOUNTER — Encounter: Payer: Self-pay | Admitting: Nurse Practitioner

## 2021-08-19 ENCOUNTER — Ambulatory Visit (INDEPENDENT_AMBULATORY_CARE_PROVIDER_SITE_OTHER): Payer: Medicare Other | Admitting: Nurse Practitioner

## 2021-08-19 ENCOUNTER — Other Ambulatory Visit: Payer: Self-pay

## 2021-08-19 VITALS — BP 153/92 | HR 64 | Temp 97.3°F | Ht 62.0 in | Wt 225.0 lb

## 2021-08-19 DIAGNOSIS — R7303 Prediabetes: Secondary | ICD-10-CM

## 2021-08-19 DIAGNOSIS — I10 Essential (primary) hypertension: Secondary | ICD-10-CM

## 2021-08-19 DIAGNOSIS — R252 Cramp and spasm: Secondary | ICD-10-CM

## 2021-08-19 MED ORDER — AMLODIPINE BESYLATE 5 MG PO TABS: 5.0000 mg | ORAL_TABLET | Freq: Every day | ORAL | 3 refills | Status: AC

## 2021-08-19 MED ORDER — ONETOUCH ULTRA 2 W/DEVICE KIT: PACK | 0 refills | Status: AC

## 2021-08-19 MED ORDER — ONETOUCH ULTRASOFT LANCETS MISC
12 refills | Status: AC
Start: 2021-08-19 — End: ?

## 2021-08-19 NOTE — Progress Notes (Signed)
[place], [place]  [address] Phone:  [phone]   Fax:  [phone] The review of   Established Patient Office Visit  Subjective:  Patient ID: Doris Green, female    DOB: Feb 25, 1956  Age: 65 y.o. MRN: [id number]  CC:  Chief Complaint  Patient presents with   Follow-up    3 month follow up, stopped taken blood pressure medication this week. Stated she saw on news hydrocholoride was recalled.    HPI Doris Green presents for follow up. She  has a past medical history of Allergy, Arthritis, Depression, History of blood transfusion, Spider bite, and UTERINE FIBROID (12/13/2006).   She is in today for follow up and reports that she has stopped taking her HCTZ due to a recall that she saw on TV. She has had some shortness of breath. She had not had some swelling. She has noticed more cramps in her thighs. She reports that this is worse at night. She is not sleeping well at all.   Denies headache, dizziness, visual changes, chest pain, nausea, vomiting or any edema.   She reports being unable to get her glucometer due to this being sent to [facility] but now she has to use [facility] due to insurance. She reports change in appetite. She has had a one pound weight gain.   Past Medical History:  Diagnosis Date   Allergy    Arthritis    Depression    History of blood transfusion    Spider bite    2016 and 2017   UTERINE FIBROID 12/13/2006    Past Surgical History:  Procedure Laterality Date   SHOULDER SURGERY     TUBAL LIGATION      Family History  Problem Relation Age of Onset   Diabetes Mother    Hypertension Mother    Heart disease Mother    Cancer Father    Diabetes Sister    Hypertension Sister    Heart disease Sister    Hypertension Brother    Diabetes Brother    Cancer Maternal Grandmother    Colon cancer Neg Hx    Colon polyps Neg Hx    Esophageal cancer Neg Hx    Stomach cancer Neg Hx    Rectal cancer Neg  Hx    Breast cancer Neg Hx     Social History   Socioeconomic History   Marital status: Single    Spouse name: Not on file   Number of children: 3   Years of education: GED   Highest education level: Not on file  Occupational History   Occupation: Custodian    Employer: [employer]  Tobacco Use   Smoking status: Some Days    Packs/day: 0.25    Types: Cigarettes    Last attempt to quit: 10/19/2011    Years since quitting: 9.8   Smokeless tobacco: Never   Tobacco comments:    2-3  cigs// per day  Vaping Use   Vaping Use: Never used  Substance and Sexual Activity   Alcohol use: No   Drug use: No   Sexual activity: Not on file  Other Topics Concern   Not on file  Social History Narrative   Not on file   Social Determinants of Health   Financial Resource Strain: Not on file  Food Insecurity: Not on file  Transportation Needs: Not on file  Physical Activity: Not on file  Stress: Not on file  Social  Connections: Not on file  Intimate Partner Violence: Not on file    Outpatient Medications Prior to Visit  Medication Sig Dispense Refill   Ascorbic Acid (VITAMIN C) 1000 MG tablet Take 1,000 mg by mouth daily.     cholecalciferol (VITAMIN D3) 25 MCG (1000 UT) tablet Take 1,000 Units by mouth daily.     Multiple Vitamins-Minerals (WOMENS HAIR, SKIN & NAILS PO) Take by mouth.     Omega-3 1000 MG CAPS Take by mouth.     OVER THE COUNTER MEDICATION Beet chew, Boisenberry Gold     Probiotic Product (PROBIOTIC ADVANCED PO) Take by mouth as needed.     Zinc-Magnesium Aspart-Vit B6 (ZINC MAGNESIUM ASPARTATE PO) Take by mouth. Once a day     amLODipine (NORVASC) 2.5 MG tablet Take 1 tablet (2.5 mg total) by mouth daily. 30 tablet 11   blood glucose meter kit and supplies KIT Dispense based on patient and insurance preference. Use up to four times daily as directed. 1 each 0   Blood Glucose Monitoring Suppl (ONE TOUCH ULTRA 2) w/Device KIT Use up to 4 times daily as directed. 1  kit 0   glucose blood test strip Use up to 4 times daily as directed. 100 each 12   Lancets (ONETOUCH ULTRASOFT) lancets Use up to 4 times daily as directed. 100 each 12   loratadine (CLARITIN) 10 MG tablet Take 1 tablet (10 mg total) by mouth daily. (Patient not taking: Reported on 08/19/2021) 30 tablet 11   hydrochlorothiazide (HYDRODIURIL) 12.5 MG tablet Take 1 tablet (12.5 mg total) by mouth daily. TAKE 1 TABLET(12.5 MG) BY MOUTH DAILY (Patient not taking: Reported on 08/19/2021) 30 tablet 11   neomycin-polymyxin-hydrocortisone (CORTISPORIN) OTIC solution Place 3 drops into both ears 4 (four) times daily. 10 mL 0   No facility-administered medications prior to visit.    Allergies  Allergen Reactions   Ibuprofen     Blood in stool    ROS Review of Systems    Objective:    Physical Exam Constitutional:      Appearance: She is obese.  HENT:     Head: Normocephalic and atraumatic.     Nose: Nose normal.     Mouth/Throat:     Mouth: Mucous membranes are moist.  Cardiovascular:     Rate and Rhythm: Normal rate and regular rhythm.     Pulses: Normal pulses.     Heart sounds: Normal heart sounds.  Pulmonary:     Effort: Pulmonary effort is normal.     Breath sounds: Normal breath sounds.  Abdominal:     General: Bowel sounds are normal.     Palpations: Abdomen is soft.  Musculoskeletal:        General: Normal range of motion.     Cervical back: Normal range of motion.     Right lower leg: No edema.     Left lower leg: No edema.  Skin:    General: Skin is warm and dry.     Capillary Refill: Capillary refill takes less than 2 seconds.  Neurological:     General: No focal deficit present.     Mental Status: She is alert.  Psychiatric:        Mood and Affect: Mood normal.        Behavior: Behavior normal.        Thought Content: Thought content normal.        Judgment: Judgment normal.    BP (!) 178/94   Pulse  73   Temp (!) 97.3 F (36.3 C)   Ht $R'5\' 2"'PX$  (1.575 m)    Wt 225 lb 0.4 oz (102.1 kg)   SpO2 100%   BMI 41.16 kg/m  Wt Readings from Last 3 Encounters:  08/19/21 225 lb 0.4 oz (102.1 kg)  05/19/21 224 lb 0.6 oz (101.6 kg)  04/06/21 224 lb 0.2 oz (101.6 kg)     There are no preventive care reminders to display for this patient.   There are no preventive care reminders to display for this patient.  Lab Results  Component Value Date   TSH 1.930 01/15/2020   Lab Results  Component Value Date   WBC 8.4 01/15/2020   HGB 12.9 01/15/2020   HCT 39.4 01/15/2020   MCV 80 01/15/2020   PLT 283 01/15/2020   Lab Results  Component Value Date   NA 141 05/26/2021   K 4.5 05/26/2021   CO2 23 08/12/2019   GLUCOSE 104 (H) 05/26/2021   BUN 9 05/26/2021   CREATININE 0.85 05/26/2021   BILITOT 0.2 05/26/2021   ALKPHOS 82 05/26/2021   AST 15 05/26/2021   ALT 15 08/07/2018   PROT 6.5 05/26/2021   ALBUMIN 4.3 05/26/2021   CALCIUM 9.4 05/26/2021   EGFR 76 05/26/2021   Lab Results  Component Value Date   CHOL 195 05/26/2021   Lab Results  Component Value Date   HDL 71 05/26/2021   Lab Results  Component Value Date   LDLCALC 105 (H) 05/26/2021   Lab Results  Component Value Date   TRIG 106 05/26/2021   Lab Results  Component Value Date   CHOLHDL 2.7 05/26/2021   Lab Results  Component Value Date   HGBA1C 5.8 (A) 05/19/2021      Assessment & Plan:   Problem List Items Addressed This Visit       Cardiovascular and Mediastinum   Essential hypertension - Primary Persistent due to patient compliance DC HCTZ Increased Amlodipine 5 mg daily  Encouraged on going compliance with current medication regimen Encouraged home monitoring and recording BP <130/80 Eating a heart-healthy diet with less salt Encouraged regular physical activity  Recommend Weight loss     Relevant Medications   amLODipine (NORVASC) 5 MG tablet   Other Relevant Orders   Comp. Metabolic Panel (12)     Other   Prediabetes Consider home glucose  monitoring Weight loss at least 5% of current body weight is can be achieved with lifestyle modification dietary changes and regular daily exercise Encourage blood pressure control goal <120/80 and maintaining total cholesterol <200 Follow-up every 3 to 6 months for reevaluation Education material provided    Relevant Medications   Blood Glucose Monitoring Suppl (ONE TOUCH ULTRA 2) w/Device KIT   Lancets (ONETOUCH ULTRASOFT) lancets   Other Relevant Orders   Comp. Metabolic Panel (12)   Other Visit Diagnoses     Muscle cramps     Evaluation of electrolytes pending    Relevant Orders   Comp. Metabolic Panel (12)       Meds ordered this encounter  Medications   amLODipine (NORVASC) 5 MG tablet    Sig: Take 1 tablet (5 mg total) by mouth daily.    Dispense:  90 tablet    Refill:  3    Order Specific Question:   Supervising Provider    Answer:   Tresa Garter [id number]   Blood Glucose Monitoring Suppl (ONE TOUCH ULTRA 2) w/Device KIT    Sig: Use up  to 4 times daily as directed.    Dispense:  1 kit    Refill:  0    Order Specific Question:   Supervising Provider    Answer:   Tresa Garter [[id number]]   Lancets (ONETOUCH ULTRASOFT) lancets    Sig: Use up to 4 times daily as directed.    Dispense:  100 each    Refill:  12    Order Specific Question:   Supervising Provider    Answer:   Tresa Garter [[id number]]    Follow-up: Return in about 3 months (around 11/19/2021) for Follow up HTN [id number].    Vevelyn Francois, NP

## 2021-08-19 NOTE — Patient Instructions (Signed)
Muscle Cramps and Spasms Muscle cramps and spasms are when muscles tighten by themselves. They usually get better within minutes. Muscle cramps are painful. They are usually stronger and last longer than muscle spasms. Muscle spasms may or may not be painful. They can last a few seconds or much longer. Cramps and spasms can affect any muscle, but they occur most often in the calf muscles of the leg. They are usually not caused by a serious problem. In many cases, the cause is not known. Some common causes include: Doing more physical work or exercise than your body is ready for. Using the muscles too much (overuse) by repeating certain movements too many times. Staying in a certain position for a long time. Playing a sport or doing an activity without preparing properly. Using bad form or technique while playing a sport or doing an activity. Not having enough water in your body (dehydration). Injury. Side effects of some medicines. Low levels of the salts and minerals in your blood (electrolytes), such as low potassium or calcium. Follow these instructions at home: Managing pain and stiffness   Massage, stretch, and relax the muscle. Do this for many minutes at a time. If told, put heat on tight or tense muscles as often as told by your doctor. Use the heat source that your doctor recommends, such as a moist heat pack or a heating pad. Place a towel between your skin and the heat source. Leave the heat on for 20-30 minutes. Remove the heat if your skin turns bright red. This is very important if you are not able to feel pain, heat, or cold. You may have a greater risk of getting burned. If told, put ice on the affected area. This may help if you are sore or have pain after a cramp or spasm. Put ice in a plastic bag. Place a towel between your skin and the bag. Leave the ice on for 20 minutes, 2-3 times a day. Try taking hot showers or baths to help relax tight muscles. Eating and  drinking Drink enough fluid to keep your pee (urine) pale yellow. Eat a healthy diet to help ensure that your muscles work well. This should include: Fruits and vegetables. Lean protein. Whole grains. Low-fat or nonfat dairy products. General instructions If you are having cramps often, avoid intense exercise for several days. Take over-the-counter and prescription medicines only as told by your doctor. Watch for any changes in your symptoms. Keep all follow-up visits as told by your doctor. This is important. Contact a doctor if: Your cramps or spasms get worse or happen more often. Your cramps or spasms do not get better with time. Summary Muscle cramps and spasms are when muscles tighten by themselves. They usually get better within minutes. Cramps and spasms occur most often in the calf muscles of the leg. Massage, stretch, and relax the muscle. This may help the cramp or spasm go away. Drink enough fluid to keep your pee (urine) pale yellow. This information is not intended to replace advice given to you by your health care provider. Make sure you discuss any questions you have with your health care provider. Document Revised: 04/22/2021 Document Reviewed: 04/22/2021 Elsevier Patient Education  [place].

## 2021-08-20 LAB — COMP. METABOLIC PANEL (12)
AST: 13 IU/L (ref 0–40)
Albumin/Globulin Ratio: 2.2 (ref 1.2–2.2)
Albumin: 4.7 g/dL (ref 3.8–4.8)
Alkaline Phosphatase: 89 IU/L (ref 44–121)
BUN/Creatinine Ratio: 17 (ref 12–28)
BUN: 14 mg/dL (ref 8–27)
Bilirubin Total: 0.2 mg/dL (ref 0.0–1.2)
Calcium: 9.9 mg/dL (ref 8.7–10.3)
Chloride: 104 mmol/L (ref 96–106)
Creatinine, Ser: 0.83 mg/dL (ref 0.57–1.00)
Globulin, Total: 2.1 g/dL (ref 1.5–4.5)
Glucose: 88 mg/dL (ref 70–99)
Potassium: 4.6 mmol/L (ref 3.5–5.2)
Sodium: 142 mmol/L (ref 134–144)
Total Protein: 6.8 g/dL (ref 6.0–8.5)
eGFR: 78 mL/min/{1.73_m2} (ref >59)

## 2021-11-21 ENCOUNTER — Ambulatory Visit: Payer: Self-pay | Admitting: Nurse Practitioner

## 2021-12-12 ENCOUNTER — Encounter: Payer: Self-pay | Admitting: Gastroenterology

## 2021-12-13 ENCOUNTER — Other Ambulatory Visit: Payer: Self-pay | Admitting: Internal Medicine

## 2021-12-13 DIAGNOSIS — Z1231 Encounter for screening mammogram for malignant neoplasm of breast: Secondary | ICD-10-CM

## 2021-12-23 ENCOUNTER — Ambulatory Visit
Admission: RE | Admit: 2021-12-23 | Discharge: 2021-12-23 | Disposition: A | Discharge status: Home / Self Care | Payer: Medicare PPO | Source: Ambulatory Visit | Attending: Internal Medicine | Admitting: Internal Medicine

## 2021-12-23 DIAGNOSIS — Z1231 Encounter for screening mammogram for malignant neoplasm of breast: Secondary | ICD-10-CM

## 2022-03-01 ENCOUNTER — Other Ambulatory Visit: Payer: Self-pay | Admitting: Internal Medicine

## 2022-03-01 DIAGNOSIS — Z780 Asymptomatic menopausal state: Secondary | ICD-10-CM

## 2022-06-26 ENCOUNTER — Ambulatory Visit
Admission: RE | Admit: 2022-06-26 | Discharge: 2022-06-26 | Disposition: A | Discharge status: Home / Self Care | Payer: Medicare PPO | Source: Ambulatory Visit | Attending: Family Medicine | Admitting: Family Medicine

## 2022-06-26 ENCOUNTER — Other Ambulatory Visit: Payer: Self-pay | Admitting: Family Medicine

## 2022-06-26 DIAGNOSIS — M545 Low back pain, unspecified: Secondary | ICD-10-CM

## 2022-10-18 ENCOUNTER — Ambulatory Visit (HOSPITAL_COMMUNITY): Payer: Self-pay | Admitting: Psychiatry

## 2022-12-12 ENCOUNTER — Other Ambulatory Visit: Payer: Self-pay | Admitting: Internal Medicine

## 2022-12-12 DIAGNOSIS — Z1231 Encounter for screening mammogram for malignant neoplasm of breast: Secondary | ICD-10-CM

## 2023-01-30 ENCOUNTER — Ambulatory Visit
Admission: RE | Admit: 2023-01-30 | Discharge: 2023-01-30 | Disposition: A | Discharge status: Home / Self Care | Payer: Medicare PPO | Source: Ambulatory Visit | Attending: Internal Medicine | Admitting: Internal Medicine

## 2023-01-30 DIAGNOSIS — Z1231 Encounter for screening mammogram for malignant neoplasm of breast: Secondary | ICD-10-CM

## 2023-05-04 NOTE — Therapy (Signed)
OUTPATIENT PHYSICAL THERAPY EVALUATION   Patient Name: Doris Green MRN: [id number] DOB:02-27-1956, 67 y.o., female Today's Date: 05/07/2023   END OF SESSION:  PT End of Session - 05/07/23 0758     Visit Number 1    Number of Visits 9    Date for PT Re-Evaluation 07/02/23    Authorization Type [employer] MCR    Progress Note Due on Visit 10    PT Start Time 0800    PT Stop Time 0845    PT Time Calculation (min) 45 min    Activity Tolerance Patient tolerated treatment well    Behavior During Therapy [facility] for tasks assessed/performed             Past Medical History:  Diagnosis Date   Allergy    Arthritis    Depression    History of blood transfusion    Spider bite    2016 and 2017   UTERINE FIBROID 12/13/2006   Past Surgical History:  Procedure Laterality Date   SHOULDER SURGERY     TUBAL LIGATION     Patient Active Problem List   Diagnosis Date Noted   Morbid obesity, unspecified obesity type (HCC) 01/05/2021   Allergic rhinitis 08/16/2019   Chronic pain of right knee 08/16/2019   Prediabetes 08/16/2019   Essential hypertension 03/05/2018   Preventative health care 10/12/2011   OBESITY 08/31/2008   GEN OSTEOARTHROSIS INVOLVING MULTIPLE SITES 08/31/2008   DEPRESSION, MAJOR, RECURRENT 12/13/2006    PCP: Barbette Merino, NP  REFERRING PROVIDER: Corliss Blacker, MD  REFERRING DIAG: Pain in right hip  THERAPY DIAG:  Pain in right hip  Muscle weakness (generalized)  Rationale for Evaluation and Treatment: Rehabilitation  ONSET DATE: "A couple of months"   SUBJECTIVE:  SUBJECTIVE STATEMENT: Patient reports right hip/buttock, lower back, and thigh pain that makes it hard for her to walk and straighten up sometimes. States it is uncomfortable and hurts like crazy. Has been ongoing for a couple of months with no apparent mechanism of injury. She states she can't stand for a long period, maybe 10-15 minutes at most, she has a hard time standing up  straight, walking hurts and she has to take a lot of breaks doing housework. Feels like a big balled up muscle in the way that hinders her from doing much. She can't lie on the right side and turning over in bed is difficult. Also the weather can cause her pain to be worse. Denies any numbness or tingling.   PERTINENT HISTORY: HTN  PAIN:  Are you having pain? Yes:  NPRS scale: 3/10 (8.5/10 with standing/walking) Pain location: Right hip Pain description: "big ball of muscle in the way" Aggravating factors: Standing, walking, bed mobility Relieving factors: Rest  PRECAUTIONS: None  RED FLAGS: None   WEIGHT BEARING RESTRICTIONS: No  FALLS:  Has patient fallen in last 6 months? No  PLOF: Independent  PATIENT GOALS: Pain relief, improve walking and standing   OBJECTIVE:  PATIENT SURVEYS:  FOTO 36% functional status  COGNITION: Overall cognitive status: Within functional limits for tasks assessed     SENSATION: WFL  MUSCLE LENGTH: Piriformis muscle tightness, slight deficit of hamstring and quad  POSTURE:   Rounded shoulder posture,   PALPATION: Generalized tenderness of right gluteal and lateral hip/greater trochanter region  LUMBAR ROM:   Active  A/PROM  eval  Flexion WFL  Extension WFL  Right lateral flexion WFL  Left lateral flexion WFL  Right rotation 75%  Left  rotation 75%   (Blank rows = not tested)  Patient reports feeling right buttock pain with lumbar rotation  LOWER EXTREMITY ROM:   Right hip PROM grossly WFL  LOWER EXTREMITY MMT:  MMT Right eval Left eval  Hip flexion 4- 4  Hip extension 3 4-  Hip abduction 3+ 4-  Hip adduction    Hip internal rotation    Hip external rotation    Knee flexion 5 5  Knee extension 4+ 5  Ankle dorsiflexion    Ankle plantarflexion    Ankle inversion    Ankle eversion     (Blank rows = not tested)  LOWER EXTREMITY SPECIAL TESTS:  Not assessed  FUNCTIONAL TESTS:  Sit to stand:    GAIT: Assistive device utilized: None Level of assistance: Complete Independence Comments: Antalgic on right   TODAY'S TREATMENT:     [facility] Adult PT Treatment:                                                DATE: 05/07/2023 Therapeutic Exercise: Supine piriformis stretch push/pull x 20 sec each SLR x 10 Bridge x 10 Side clamshell with yellow x 10  PATIENT EDUCATION:  Education details: Exam findings, POC, HEP, possible use of TPDN Person educated: Patient Education method: Explanation, Demonstration, Tactile cues, Verbal cues, and Handouts Education comprehension: verbalized understanding, returned demonstration, verbal cues required, tactile cues required, and needs further education  HOME EXERCISE PROGRAM: Access Code: [email or web address]   ASSESSMENT: CLINICAL IMPRESSION: Patient is a 67 y.o. female who was seen today for physical therapy evaluation and treatment for chronic right hip and thigh pain. She does not seem to exhibit any radicular symptoms. She exhibits flexibility deficits of the right hip and thigh, gross strength deficits of the right hip, and poor tolerance for standing and walking that are impacting her functional ability.    OBJECTIVE IMPAIRMENTS: Abnormal gait, decreased activity tolerance, difficulty walking, decreased ROM, decreased strength, impaired flexibility, postural dysfunction, and pain.   ACTIVITY LIMITATIONS: carrying, standing, sleeping, stairs, transfers, bed mobility, and locomotion level  PARTICIPATION LIMITATIONS: meal prep, cleaning, shopping, and community activity  PERSONAL FACTORS: Fitness, Past/current experiences, and Time since onset of injury/illness/exacerbation are also affecting patient's functional outcome.   REHAB POTENTIAL: Good  CLINICAL DECISION MAKING: Stable/uncomplicated  EVALUATION COMPLEXITY: Low   GOALS: Goals reviewed with patient? Yes  SHORT TERM GOALS: Target date: 06/04/2023  Patient will be I with initial HEP in  order to progress with therapy. Baseline: HEP provided at eval Goal status: INITIAL  2.  Patient will report pain with standing and/or walking </= 5/10 in order to reduce functional limitations Baseline: 8.5/10 Goal status: INITIAL  LONG TERM GOALS: Target date: 07/02/2023  Patient will be I with final HEP to maintain progress from PT. Baseline: HEP provided at eval Goal status: INITIAL  2.  Patient will report >/= 56% status on FOTO to indicate improved functional ability. Baseline: 36% functional status Goal status: INITIAL  3.  Patient will demonstrate right hip strength >/= 4-/5 MMT in order to improve her walking and standing tolerance Baseline: see limitations noted above Goal status: INITIAL  4.  Patient will be able to stand and/or walk >/= 30 minutes in order to improve ability to perform household chores and improve shopping ability. Baseline: 10-15 minutes Goal status: INITIAL   PLAN: PT FREQUENCY: 1x/week  PT DURATION: 8 weeks  PLANNED INTERVENTIONS: Therapeutic exercises, Therapeutic activity, Neuromuscular re-education, Balance training, Gait training, Patient/Family education, Self Care, and Joint mobilization  PLAN FOR NEXT SESSION: Review HEP and progress PRN, manual/TPDN for right gluteal and piriformis region, right hip and thigh stretching, progress hip strengthening   Rosana Hoes, PT, DPT, LAT, ATC 05/07/23  9:59 AM Phone: [phone] Fax: [phone]   Referring diagnosis? M25.551  Treatment diagnosis? (if different than referring diagnosis) M25.551  What was this (referring dx) caused by? []  Surgery []  Fall [x]  Ongoing issue []  Arthritis []  Other: ____________  Laterality: [x]  Rt []  Lt []  Both  Check all possible CPT codes:  *CHOOSE 10 OR LESS*    []  97110 (Therapeutic Exercise)  []  92507 (SLP Treatment)  []  97112 (Neuro Re-ed)   []  92526 (Swallowing Treatment)   []  97116 (Gait Training)   []  [id number] (Cognitive Training, 1st 15  minutes) []  97140 (Manual Therapy)   []  97130 (Cognitive Training, each add'l 15 minutes)  []  95638 (Re-evaluation)                              []  Other, List CPT Code ____________  []  97530 (Therapeutic Activities)     []  97535 (Self Care)   [x]  All codes above (97110 - 97535)  []  97012 (Mechanical Traction)  [x]  97014 (E-stim Unattended)  []  97032 (E-stim manual)  []  97033 (Ionto)  []  97035 (Ultrasound) []  97750 (Physical Performance Training) [x]  [id number] (Aquatic Therapy) []  97016 (Vasopneumatic Device) []  [id number] (Paraffin) []  97034 (Contrast Bath) []  97597 (Wound Care 1st 20 sq cm) []  97598 (Wound Care each add'l 20 sq cm) []  97760 (Orthotic Fabrication, Fitting, Training Initial) []  [id number] (Prosthetic Management and Training Initial) []  [id number] (Orthotic or Prosthetic Training/ Modification Subsequent)

## 2023-05-07 ENCOUNTER — Other Ambulatory Visit: Payer: Self-pay

## 2023-05-07 ENCOUNTER — Ambulatory Visit: Payer: Medicare PPO | Attending: Family Medicine | Admitting: Physical Therapy

## 2023-05-07 ENCOUNTER — Encounter: Payer: Self-pay | Admitting: Physical Therapy

## 2023-05-07 DIAGNOSIS — M25611 Stiffness of right shoulder, not elsewhere classified: Secondary | ICD-10-CM | POA: Diagnosis present

## 2023-05-07 DIAGNOSIS — M25551 Pain in right hip: Secondary | ICD-10-CM | POA: Insufficient documentation

## 2023-05-07 DIAGNOSIS — M6281 Muscle weakness (generalized): Secondary | ICD-10-CM | POA: Diagnosis present

## 2023-05-07 NOTE — Patient Instructions (Signed)
Access Code: [email or web address] URL: [email or web address] Date: 05/07/2023 Prepared by: Rosana Hoes  Exercises - Supine Piriformis Stretch with Foot on Ground  - 1 x daily - 3 reps - 20 seconds hold - Supine Figure 4 Piriformis Stretch  - 1 x daily - 3 reps - 20 seconds hold - Straight Leg Raise  - 1 x daily - 2 sets - 10 reps - Bridge  - 1 x daily - 2 sets - 10 reps - Clam with Resistance  - 1 x daily - 2 sets - 10 reps

## 2023-05-15 NOTE — Therapy (Signed)
OUTPATIENT PHYSICAL THERAPY TREATMENT NOTE   Patient Name: Doris Green MRN: [id number] DOB:08-05-1956, 67 y.o., female Today's Date: 05/16/2023   END OF SESSION:  PT End of Session - 05/16/23 0947     Visit Number 2    Number of Visits 9    Date for PT Re-Evaluation 07/02/23    Authorization Type [employer] MCR    Progress Note Due on Visit 10    PT Start Time 0955    PT Stop Time 1033    PT Time Calculation (min) 38 min    Activity Tolerance Patient tolerated treatment well    Behavior During Therapy [facility] for tasks assessed/performed              Past Medical History:  Diagnosis Date   Allergy    Arthritis    Depression    History of blood transfusion    Spider bite    2016 and 2017   UTERINE FIBROID 12/13/2006   Past Surgical History:  Procedure Laterality Date   SHOULDER SURGERY     TUBAL LIGATION     Patient Active Problem List   Diagnosis Date Noted   Morbid obesity, unspecified obesity type (HCC) 01/05/2021   Allergic rhinitis 08/16/2019   Chronic pain of right knee 08/16/2019   Prediabetes 08/16/2019   Essential hypertension 03/05/2018   Preventative health care 10/12/2011   OBESITY 08/31/2008   GEN OSTEOARTHROSIS INVOLVING MULTIPLE SITES 08/31/2008   DEPRESSION, MAJOR, RECURRENT 12/13/2006    PCP: Barbette Merino, NP  REFERRING PROVIDER: Corliss Blacker, MD  REFERRING DIAG: Pain in right hip  THERAPY DIAG:  Pain in right hip  Muscle weakness (generalized)  Stiffness of right shoulder, not elsewhere classified  Rationale for Evaluation and Treatment: Rehabilitation  ONSET DATE: "A couple of months"   SUBJECTIVE:  SUBJECTIVE STATEMENT: Pt presents to PT with reports of continued L hip pain. Has been compliant with HEP with no adverse effect.   PERTINENT HISTORY: HTN  PAIN:  Are you having pain? Yes:  NPRS scale: 3/10 (8.5/10 with standing/walking) Pain location: Right hip Pain description: "big ball of muscle in the  way" Aggravating factors: Standing, walking, bed mobility Relieving factors: Rest  PRECAUTIONS: None  RED FLAGS: None   WEIGHT BEARING RESTRICTIONS: No  FALLS:  Has patient fallen in last 6 months? No  PLOF: Independent  PATIENT GOALS: Pain relief, improve walking and standing   OBJECTIVE:  PATIENT SURVEYS:  FOTO 36% functional status  COGNITION: Overall cognitive status: Within functional limits for tasks assessed     SENSATION: WFL  MUSCLE LENGTH: Piriformis muscle tightness, slight deficit of hamstring and quad  POSTURE:   Rounded shoulder posture,   PALPATION: Generalized tenderness of right gluteal and lateral hip/greater trochanter region  LUMBAR ROM:   Active  A/PROM  eval  Flexion WFL  Extension WFL  Right lateral flexion WFL  Left lateral flexion WFL  Right rotation 75%  Left rotation 75%   (Blank rows = not tested)  Patient reports feeling right buttock pain with lumbar rotation  LOWER EXTREMITY ROM:   Right hip PROM grossly WFL  LOWER EXTREMITY MMT:  MMT Right eval Left eval  Hip flexion 4- 4  Hip extension 3 4-  Hip abduction 3+ 4-  Hip adduction    Hip internal rotation    Hip external rotation    Knee flexion 5 5  Knee extension 4+ 5  Ankle dorsiflexion    Ankle plantarflexion  Ankle inversion    Ankle eversion     (Blank rows = not tested)  LOWER EXTREMITY SPECIAL TESTS:  Not assessed  FUNCTIONAL TESTS:  Sit to stand:   GAIT: Assistive device utilized: None Level of assistance: Complete Independence Comments: Antalgic on right   TODAY'S TREATMENT:     [facility] Adult PT Treatment:                                                DATE: 05/16/2023 Therapeutic Exercise: NuStep lvl 5 UE/LE x 5 min while taking subjective Seated fig 4 stretch 2x30" R Supine clamshell 2x15 RTB S/L clamshell 2x10 RTB Bridge 3x10 Supine ball squeeze 2x10 - 5" hold Supine SLR 2x10 each Supine fig 4 stretch x 30" R STS 2x10 - low table no  UE Piriformis release at wall x 60" Lateral walk YTB x 3 laps at counter Standing hip abd 2x10 YTB  [facility] Adult PT Treatment:                                                DATE: 05/07/2023 Therapeutic Exercise: Supine piriformis stretch push/pull x 20 sec each SLR x 10 Bridge x 10 Side clamshell with yellow x 10  PATIENT EDUCATION:  Education details: Exam findings, POC, HEP, possible use of TPDN Person educated: Patient Education method: Explanation, Demonstration, Tactile cues, Verbal cues, and Handouts Education comprehension: verbalized understanding, returned demonstration, verbal cues required, tactile cues required, and needs further education  HOME EXERCISE PROGRAM: Access Code: [email or web address]   ASSESSMENT:  CLINICAL IMPRESSION: Pt was able to complete all prescribed exercises with no adverse effect or increase in pain. Therapy focused on improving proximal hip strength and functional mobility. Continues to benefit from skilled PT services, will progress per POC as able.     OBJECTIVE IMPAIRMENTS: Abnormal gait, decreased activity tolerance, difficulty walking, decreased ROM, decreased strength, impaired flexibility, postural dysfunction, and pain.   ACTIVITY LIMITATIONS: carrying, standing, sleeping, stairs, transfers, bed mobility, and locomotion level  PARTICIPATION LIMITATIONS: meal prep, cleaning, shopping, and community activity  PERSONAL FACTORS: Fitness, Past/current experiences, and Time since onset of injury/illness/exacerbation are also affecting patient's functional outcome.   REHAB POTENTIAL: Good  CLINICAL DECISION MAKING: Stable/uncomplicated  EVALUATION COMPLEXITY: Low   GOALS: Goals reviewed with patient? Yes  SHORT TERM GOALS: Target date: 06/04/2023  Patient will be I with initial HEP in order to progress with therapy. Baseline: HEP provided at eval Goal status: INITIAL  2.  Patient will report pain with standing and/or walking </= 5/10 in order  to reduce functional limitations Baseline: 8.5/10 Goal status: INITIAL  LONG TERM GOALS: Target date: 07/02/2023  Patient will be I with final HEP to maintain progress from PT. Baseline: HEP provided at eval Goal status: INITIAL  2.  Patient will report >/= 56% status on FOTO to indicate improved functional ability. Baseline: 36% functional status Goal status: INITIAL  3.  Patient will demonstrate right hip strength >/= 4-/5 MMT in order to improve her walking and standing tolerance Baseline: see limitations noted above Goal status: INITIAL  4.  Patient will be able to stand and/or walk >/= 30 minutes in order to improve ability to perform household chores and improve shopping ability.  Baseline: 10-15 minutes Goal status: INITIAL   PLAN: PT FREQUENCY: 1x/week  PT DURATION: 8 weeks  PLANNED INTERVENTIONS: Therapeutic exercises, Therapeutic activity, Neuromuscular re-education, Balance training, Gait training, Patient/Family education, Self Care, and Joint mobilization  PLAN FOR NEXT SESSION: Review HEP and progress PRN, manual/TPDN for right gluteal and piriformis region, right hip and thigh stretching, progress hip strengthening   Eloy End PT  05/16/23 10:34 AM

## 2023-05-16 ENCOUNTER — Ambulatory Visit: Payer: Medicare PPO

## 2023-05-16 DIAGNOSIS — M25551 Pain in right hip: Secondary | ICD-10-CM | POA: Diagnosis not present

## 2023-05-16 DIAGNOSIS — M25611 Stiffness of right shoulder, not elsewhere classified: Secondary | ICD-10-CM

## 2023-05-16 DIAGNOSIS — M6281 Muscle weakness (generalized): Secondary | ICD-10-CM

## 2023-05-22 ENCOUNTER — Ambulatory Visit: Payer: Medicare PPO | Attending: Family Medicine | Admitting: Physical Therapy

## 2023-05-22 ENCOUNTER — Encounter: Payer: Self-pay | Admitting: Physical Therapy

## 2023-05-22 ENCOUNTER — Other Ambulatory Visit: Payer: Self-pay

## 2023-05-22 DIAGNOSIS — M25551 Pain in right hip: Secondary | ICD-10-CM | POA: Insufficient documentation

## 2023-05-22 DIAGNOSIS — M25611 Stiffness of right shoulder, not elsewhere classified: Secondary | ICD-10-CM | POA: Diagnosis present

## 2023-05-22 DIAGNOSIS — M6281 Muscle weakness (generalized): Secondary | ICD-10-CM | POA: Diagnosis present

## 2023-05-22 NOTE — Therapy (Signed)
OUTPATIENT PHYSICAL THERAPY TREATMENT NOTE   Patient Name: Doris Green MRN: [id number] DOB:1956/02/10, 67 y.o., female Today's Date: 05/22/2023   END OF SESSION:  PT End of Session - 05/22/23 1031     Visit Number 3    Number of Visits 9    Date for PT Re-Evaluation 07/02/23    Authorization Type [employer] M[employer]    Authorization Time Period 05/16/2023 - 07/14/2023    Authorization - Visit Number 2    Authorization - Number of Visits 8    Progress Note Due on Visit 10    PT Start Time 1015    PT Stop Time 1100    PT Time Calculation (min) 45 min    Activity Tolerance Patient tolerated treatment well    Behavior During Therapy [facility] for tasks assessed/performed               Past Medical History:  Diagnosis Date   Allergy    Arthritis    Depression    History of blood transfusion    Spider bite    2016 and 2017   UTERINE FIBROID 12/13/2006   Past Surgical History:  Procedure Laterality Date   SHOULDER SURGERY     TUBAL LIGATION     Patient Active Problem List   Diagnosis Date Noted   Morbid obesity, unspecified obesity type (HCC) 01/05/2021   Allergic rhinitis 08/16/2019   Chronic pain of right knee 08/16/2019   Prediabetes 08/16/2019   Essential hypertension 03/05/2018   Preventative health care 10/12/2011   OBESITY 08/31/2008   GEN OSTEOARTHROSIS INVOLVING MULTIPLE SITES 08/31/2008   DEPRESSION, MAJOR, RECURRENT 12/13/2006    PCP: Barbette Merino, NP  REFERRING PROVIDER: Corliss Blacker, MD  REFERRING DIAG: Pain in right hip  THERAPY DIAG:  Pain in right hip  Muscle weakness (generalized)  Rationale for Evaluation and Treatment: Rehabilitation  ONSET DATE: "A couple of months"   SUBJECTIVE:  SUBJECTIVE STATEMENT: Patient reports right hip pain that comes and goes. She does note the tightness is starting to subside.   PERTINENT HISTORY: HTN  PAIN:  Are you having pain? Yes:  NPRS scale: 6/10 (8.5/10 with standing/walking) Pain  location: Right hip Pain description: "big ball of muscle in the way" Aggravating factors: Standing, walking, bed mobility Relieving factors: Rest  PRECAUTIONS: None  RED FLAGS: None   WEIGHT BEARING RESTRICTIONS: No  FALLS:  Has patient fallen in last 6 months? No  PLOF: Independent  PATIENT GOALS: Pain relief, improve walking and standing   OBJECTIVE:  PATIENT SURVEYS:  FOTO 36% functional status  MUSCLE LENGTH: Piriformis muscle tightness, slight deficit of hamstring and quad  POSTURE:   Rounded shoulder posture,   PALPATION: Generalized tenderness of right gluteal and lateral hip/greater trochanter region  LUMBAR ROM:   Active  A/PROM  eval  Flexion WFL  Extension WFL  Right lateral flexion WFL  Left lateral flexion WFL  Right rotation 75%  Left rotation 75%   (Blank rows = not tested)  Patient reports feeling right buttock pain with lumbar rotation  LOWER EXTREMITY ROM:   Right hip PROM grossly [facility]  LOWER EXTREMITY MMT:  MMT Right eval Left eval Right 05/22/2023  Hip flexion 4- 4   Hip extension 3 4- 3+  Hip abduction 3+ 4- 4-  Hip adduction     Hip internal rotation     Hip external rotation     Knee flexion 5 5   Knee extension 4+ 5  Ankle dorsiflexion     Ankle plantarflexion     Ankle inversion     Ankle eversion      (Blank rows = not tested)  LOWER EXTREMITY SPECIAL TESTS:  Not assessed  FUNCTIONAL TESTS:  Sit to stand:   GAIT: Assistive device utilized: None Level of assistance: Complete Independence Comments: Antalgic on right   TODAY'S TREATMENT:     [facility] Adult PT Treatment:                                                DATE: 05/22/2023 Therapeutic Exercise: NuStep L5 x 5 min with UE/LE while taking subjective Supine piriformis stretch push/pull 2 x 30 sec each Supine hamstring stretch with strap 2 x 30 sec each LTR x 10 Bridge 3 x 10 Side clamshell with green 3 x 10 each Squat to chair tap 2 x 10 Forward 6"  runner step-up 2 x 10 on right Manual: Roller to right glute, piriformis, ITB, lateral quad and hamstring   [facility] Adult PT Treatment:                                                DATE: 05/16/2023 Therapeutic Exercise: NuStep lvl 5 UE/LE x 5 min while taking subjective Seated fig 4 stretch 2x30" R Supine clamshell 2x15 RTB S/L clamshell 2x10 RTB Bridge 3x10 Supine ball squeeze 2x10 - 5" hold Supine SLR 2x10 each Supine fig 4 stretch x 30" R STS 2x10 - low table no UE Piriformis release at wall x 60" Lateral walk YTB x 3 laps at counter Standing hip abd 2x10 YTB  [facility] Adult PT Treatment:                                                DATE: 05/07/2023 Therapeutic Exercise: Supine piriformis stretch push/pull x 20 sec each SLR x 10 Bridge x 10 Side clamshell with yellow x 10  PATIENT EDUCATION:  Education details: HEP Person educated: Patient Education method: Programmer, multimedia, Facilities manager, Actor cues, Verbal cues, and Handouts Education comprehension: verbalized understanding, returned demonstration, verbal cues required, tactile cues required, and needs further education  HOME EXERCISE PROGRAM: Access Code: [email or web address]   ASSESSMENT: CLINICAL IMPRESSION: Patient tolerated therapy well with no adverse effects. Therapy focused on stretching and strengthening for the right hip with good tolerance. She was able to progress with her banded resistance and to more closed chain strengthening, and she does exhibit an improvement in the right gluteal strength. No changed made to her HEP this visit. Patient would benefit from continued skilled PT to progress her mobility and strength in order to reduce pain and maximize functional ability.    OBJECTIVE IMPAIRMENTS: Abnormal gait, decreased activity tolerance, difficulty walking, decreased ROM, decreased strength, impaired flexibility, postural dysfunction, and pain.   ACTIVITY LIMITATIONS: carrying, standing, sleeping, stairs, transfers, bed  mobility, and locomotion level  PARTICIPATION LIMITATIONS: meal prep, cleaning, shopping, and community activity  PERSONAL FACTORS: Fitness, Past/current experiences, and Time since onset of injury/illness/exacerbation are also affecting patient's functional outcome.    GOALS: Goals reviewed with patient? Yes  SHORT  TERM GOALS: Target date: 06/04/2023  Patient will be I with initial HEP in order to progress with therapy. Baseline: HEP provided at eval Goal status: INITIAL  2.  Patient will report pain with standing and/or walking </= 5/10 in order to reduce functional limitations Baseline: 8.5/10 Goal status: INITIAL  LONG TERM GOALS: Target date: 07/02/2023  Patient will be I with final HEP to maintain progress from PT. Baseline: HEP provided at eval Goal status: INITIAL  2.  Patient will report >/= 56% status on FOTO to indicate improved functional ability. Baseline: 36% functional status Goal status: INITIAL  3.  Patient will demonstrate right hip strength >/= 4-/5 MMT in order to improve her walking and standing tolerance Baseline: see limitations noted above Goal status: INITIAL  4.  Patient will be able to stand and/or walk >/= 30 minutes in order to improve ability to perform household chores and improve shopping ability. Baseline: 10-15 minutes Goal status: INITIAL   PLAN: PT FREQUENCY: 1x/week  PT DURATION: 8 weeks  PLANNED INTERVENTIONS: Therapeutic exercises, Therapeutic activity, Neuromuscular re-education, Balance training, Gait training, Patient/Family education, Self Care, and Joint mobilization  PLAN FOR NEXT SESSION: Review HEP and progress PRN, manual/TPDN for right gluteal and piriformis region, right hip and thigh stretching, progress hip strengthening   Rosana Hoes, PT, DPT, LAT, ATC 05/22/23  11:27 AM Phone: [phone] Fax: [phone]

## 2023-05-30 ENCOUNTER — Ambulatory Visit: Payer: Medicare PPO | Admitting: Physical Therapy

## 2023-05-30 ENCOUNTER — Other Ambulatory Visit: Payer: Self-pay

## 2023-05-30 ENCOUNTER — Encounter: Payer: Self-pay | Admitting: Physical Therapy

## 2023-05-30 DIAGNOSIS — M25551 Pain in right hip: Secondary | ICD-10-CM

## 2023-05-30 DIAGNOSIS — M25611 Stiffness of right shoulder, not elsewhere classified: Secondary | ICD-10-CM

## 2023-05-30 DIAGNOSIS — M6281 Muscle weakness (generalized): Secondary | ICD-10-CM

## 2023-05-30 NOTE — Therapy (Signed)
OUTPATIENT PHYSICAL THERAPY TREATMENT NOTE   Patient Name: Doris Green MRN: [id number] DOB:Nov 14, 1955, 67 y.o., female Today's Date: 05/30/2023   END OF SESSION:  PT End of Session - 05/30/23 1006     Visit Number 4    Number of Visits 9    Date for PT Re-Evaluation 07/02/23    Authorization Type [employer] M[employer]    Authorization Time Period 05/16/2023 - 07/14/2023    Authorization - Visit Number 3    Authorization - Number of Visits 8    Progress Note Due on Visit 10    PT Start Time 1015    PT Stop Time 1100    PT Time Calculation (min) 45 min    Activity Tolerance Patient tolerated treatment well    Behavior During Therapy [facility] for tasks assessed/performed                Past Medical History:  Diagnosis Date   Allergy    Arthritis    Depression    History of blood transfusion    Spider bite    2016 and 2017   UTERINE FIBROID 12/13/2006   Past Surgical History:  Procedure Laterality Date   SHOULDER SURGERY     TUBAL LIGATION     Patient Active Problem List   Diagnosis Date Noted   Morbid obesity, unspecified obesity type (HCC) 01/05/2021   Allergic rhinitis 08/16/2019   Chronic pain of right knee 08/16/2019   Prediabetes 08/16/2019   Essential hypertension 03/05/2018   Preventative health care 10/12/2011   OBESITY 08/31/2008   GEN OSTEOARTHROSIS INVOLVING MULTIPLE SITES 08/31/2008   DEPRESSION, MAJOR, RECURRENT 12/13/2006    PCP: Barbette Merino, NP  REFERRING PROVIDER: Corliss Blacker, MD  REFERRING DIAG: Pain in right hip  THERAPY DIAG:  Pain in right hip  Muscle weakness (generalized)  Stiffness of right shoulder, not elsewhere classified  Rationale for Evaluation and Treatment: Rehabilitation  ONSET DATE: "A couple of months"   SUBJECTIVE:  SUBJECTIVE STATEMENT: Patient reports she is doing well.  He right hip has been feeling better, the knot feeling is not as bad as it was. Sometimes when she first gets up in the morning  she can feel it.   PERTINENT HISTORY: HTN  PAIN:  Are you having pain? Yes:  NPRS scale: 4/10 (4/10 with standing/walking) Pain location: Right hip Pain description: "big ball of muscle in the way" Aggravating factors: Standing, walking, bed mobility Relieving factors: Rest  PRECAUTIONS: None  PATIENT GOALS: Pain relief, improve walking and standing   OBJECTIVE:  PATIENT SURVEYS:  FOTO 36% functional status  MUSCLE LENGTH: Piriformis muscle tightness, slight deficit of hamstring and quad  POSTURE:   Rounded shoulder posture,   PALPATION: Generalized tenderness of right gluteal and lateral hip/greater trochanter region  LUMBAR ROM:   Active  A/PROM  eval  Flexion WFL  Extension WFL  Right lateral flexion WFL  Left lateral flexion WFL  Right rotation 75%  Left rotation 75%   (Blank rows = not tested)  Patient reports feeling right buttock pain with lumbar rotation  LOWER EXTREMITY ROM:   Right hip PROM grossly [facility]  LOWER EXTREMITY MMT:  MMT Right eval Left eval Right 05/22/2023  Hip flexion 4- 4   Hip extension 3 4- 3+  Hip abduction 3+ 4- 4-  Hip adduction     Hip internal rotation     Hip external rotation     Knee flexion 5 5   Knee extension  4+ 5   Ankle dorsiflexion     Ankle plantarflexion     Ankle inversion     Ankle eversion      (Blank rows = not tested)  LOWER EXTREMITY SPECIAL TESTS:  Not assessed  FUNCTIONAL TESTS:  Sit to stand:   GAIT: Assistive device utilized: None Level of assistance: Complete Independence Comments: Antalgic on right   TODAY'S TREATMENT:     [facility] Adult PT Treatment:                                                DATE: 05/30/2023 Therapeutic Exercise: NuStep L6 x 5 min with UE/LE while taking subjective Supine piriformis stretch push/pull 2 x 30 sec each Modified thomas stretch 3 x 30 sec on right Hooklying bent knee fall out 2 x 10 each LTR x 10 Bridge 2 x 10 Side clamshell with green 2 x 15  each Forward 8" runner step-up 2 x 10 each Dead lift 25# 2 x 10 Lateral band walk at counter with green at knees x 5 lengths down/back   [facility] Adult PT Treatment:                                                DATE: 05/22/2023 Therapeutic Exercise: NuStep L5 x 5 min with UE/LE while taking subjective Supine piriformis stretch push/pull 2 x 30 sec each Supine hamstring stretch with strap 2 x 30 sec each LTR x 10 Bridge 3 x 10 Side clamshell with green 3 x 10 each Squat to chair tap 2 x 10 Forward 6" runner step-up 2 x 10 on right Manual: Roller to right glute, piriformis, ITB, lateral quad and hamstring  [facility] Adult PT Treatment:                                                DATE: 05/16/2023 Therapeutic Exercise: NuStep lvl 5 UE/LE x 5 min while taking subjective Seated fig 4 stretch 2x30" R Supine clamshell 2x15 RTB S/L clamshell 2x10 RTB Bridge 3x10 Supine ball squeeze 2x10 - 5" hold Supine SLR 2x10 each Supine fig 4 stretch x 30" R STS 2x10 - low table no UE Piriformis release at wall x 60" Lateral walk YTB x 3 laps at counter Standing hip abd 2x10 YTB  PATIENT EDUCATION:  Education details: HEP Person educated: Patient Education method: Programmer, multimedia, Demonstration, Actor cues, Verbal cues Education comprehension: verbalized understanding, returned demonstration, verbal cues required, tactile cues required, and needs further education  HOME EXERCISE PROGRAM: Access Code: [email or web address]   ASSESSMENT: CLINICAL IMPRESSION: Patient tolerated therapy well with no adverse effects. Therapy focused primarily on stretching for the right hip and progressing her hip strengthening with good tolerance. She does report an improvement in her right hip pain overall and with activity. She tolerated progressions in her hip strengthening this visit but did report fatigue and some bilateral knee discomfort that resolved with rest. No changes made to HEP this visit. Patient would benefit from  continued skilled PT to progress her mobility and strength in order to reduce pain and maximize functional ability.  OBJECTIVE IMPAIRMENTS: Abnormal gait, decreased activity tolerance, difficulty walking, decreased ROM, decreased strength, impaired flexibility, postural dysfunction, and pain.   ACTIVITY LIMITATIONS: carrying, standing, sleeping, stairs, transfers, bed mobility, and locomotion level  PARTICIPATION LIMITATIONS: meal prep, cleaning, shopping, and community activity  PERSONAL FACTORS: Fitness, Past/current experiences, and Time since onset of injury/illness/exacerbation are also affecting patient's functional outcome.    GOALS: Goals reviewed with patient? Yes  SHORT TERM GOALS: Target date: 06/04/2023  Patient will be I with initial HEP in order to progress with therapy. Baseline: HEP provided at eval 05/30/2023: independent Goal status: MET  2.  Patient will report pain with standing and/or walking </= 5/10 in order to reduce functional limitations Baseline: 8.5/10 05/30/2023: 4/10 Goal status: MET  LONG TERM GOALS: Target date: 07/02/2023  Patient will be I with final HEP to maintain progress from PT. Baseline: HEP provided at eval Goal status: INITIAL  2.  Patient will report >/= 56% status on FOTO to indicate improved functional ability. Baseline: 36% functional status Goal status: INITIAL  3.  Patient will demonstrate right hip strength >/= 4-/5 MMT in order to improve her walking and standing tolerance Baseline: see limitations noted above Goal status: INITIAL  4.  Patient will be able to stand and/or walk >/= 30 minutes in order to improve ability to perform household chores and improve shopping ability. Baseline: 10-15 minutes Goal status: INITIAL   PLAN: PT FREQUENCY: 1x/week  PT DURATION: 8 weeks  PLANNED INTERVENTIONS: Therapeutic exercises, Therapeutic activity, Neuromuscular re-education, Balance training, Gait training, Patient/Family  education, Self Care, and Joint mobilization  PLAN FOR NEXT SESSION: Review HEP and progress PRN, manual/TPDN for right gluteal and piriformis region, right hip and thigh stretching, progress hip strengthening   Rosana Hoes, PT, DPT, LAT, ATC 05/30/23  11:00 AM Phone: [phone] Fax: [phone]

## 2023-06-06 ENCOUNTER — Ambulatory Visit: Payer: Medicare PPO | Admitting: Physical Therapy

## 2023-06-14 ENCOUNTER — Other Ambulatory Visit: Payer: Self-pay

## 2023-06-14 ENCOUNTER — Encounter: Payer: Self-pay | Admitting: Physical Therapy

## 2023-06-14 ENCOUNTER — Ambulatory Visit: Payer: Medicare PPO | Admitting: Physical Therapy

## 2023-06-14 DIAGNOSIS — M25551 Pain in right hip: Secondary | ICD-10-CM | POA: Diagnosis not present

## 2023-06-14 DIAGNOSIS — M6281 Muscle weakness (generalized): Secondary | ICD-10-CM

## 2023-06-14 NOTE — Therapy (Signed)
OUTPATIENT PHYSICAL THERAPY TREATMENT NOTE   Patient Name: Doris Green MRN: [id number] DOB:1955/12/29, 67 y.o., female Today's Date: 06/14/2023   END OF SESSION:  PT End of Session - 06/14/23 0936     Visit Number 5    Number of Visits 9    Date for PT Re-Evaluation 07/02/23    Authorization Type [employer] M[employer]    Authorization Time Period 05/16/2023 - 07/14/2023    Authorization - Visit Number 4    Authorization - Number of Visits 8    Progress Note Due on Visit 10    PT Start Time 0930    PT Stop Time 1015    PT Time Calculation (min) 45 min    Activity Tolerance Patient tolerated treatment well    Behavior During Therapy [facility] for tasks assessed/performed                 Past Medical History:  Diagnosis Date   Allergy    Arthritis    Depression    History of blood transfusion    Spider bite    2016 and 2017   UTERINE FIBROID 12/13/2006   Past Surgical History:  Procedure Laterality Date   SHOULDER SURGERY     TUBAL LIGATION     Patient Active Problem List   Diagnosis Date Noted   Morbid obesity, unspecified obesity type (HCC) 01/05/2021   Allergic rhinitis 08/16/2019   Chronic pain of right knee 08/16/2019   Prediabetes 08/16/2019   Essential hypertension 03/05/2018   Preventative health care 10/12/2011   OBESITY 08/31/2008   GEN OSTEOARTHROSIS INVOLVING MULTIPLE SITES 08/31/2008   DEPRESSION, MAJOR, RECURRENT 12/13/2006    PCP: Barbette Merino, NP  REFERRING PROVIDER: Corliss Blacker, MD  REFERRING DIAG: Pain in right hip  THERAPY DIAG:  Pain in right hip  Muscle weakness (generalized)  Rationale for Evaluation and Treatment: Rehabilitation  ONSET DATE: "A couple of months"   SUBJECTIVE:  SUBJECTIVE STATEMENT: Patient reports her hip is feeling better today. She still can't stand very long, maybe 15-20 minutes.   PERTINENT HISTORY: HTN  PAIN:  Are you having pain? 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"   OBJECTIVE:  PATIENT SURVEYS:  FOTO 36% functional status  06/14/2023: 49%  MUSCLE LENGTH: Piriformis muscle tightness, slight deficit of hamstring and quad  POSTURE:   Rounded shoulder posture,   PALPATION: Generalized tenderness of right gluteal and lateral hip/greater trochanter region  LUMBAR ROM:   Active  A/PROM  eval  Flexion WFL  Extension WFL  Right lateral flexion WFL  Left lateral flexion WFL  Right rotation 75%  Left rotation 75%   (Blank rows = not tested)  Patient reports feeling right buttock pain with lumbar rotation  LOWER EXTREMITY ROM:   Right hip PROM grossly [facility]  LOWER EXTREMITY MMT:  MMT Right eval Left eval Right 05/22/2023  Hip flexion 4- 4   Hip extension 3 4- 3+  Hip abduction 3+ 4- 4-  Hip adduction     Hip internal rotation     Hip external rotation     Knee flexion 5 5   Knee extension 4+ 5   Ankle dorsiflexion     Ankle plantarflexion  Ankle inversion     Ankle eversion      (Blank rows = not tested)  LOWER EXTREMITY SPECIAL TESTS:  Not assessed  FUNCTIONAL TESTS:  Sit to stand:   GAIT: Assistive device utilized: None Level of assistance: Complete Independence Comments: Antalgic on right   TODAY'S TREATMENT:     [facility] Adult PT Treatment:                                                DATE: 06/14/2023 Therapeutic Exercise: NuStep L6 x 5 min with LE while taking subjective Bridge 2 x 10 Sidelying hip abduction 3 x 15 each Hooklying unilateral clamshell with blue 2 x 10 each [place] squat to table tap with 10# 3 x 10 Lateral 6" step up and over 2 x 10 Dead lift 30# KB 3 x 10   [facility] Adult PT Treatment:                                                 DATE: 05/30/2023 Therapeutic Exercise: NuStep L6 x 5 min with UE/LE while taking subjective Supine piriformis stretch push/pull 2 x 30 sec each Modified thomas stretch 3 x 30 sec on right Hooklying bent knee fall out 2 x 10 each LTR x 10 Bridge 2 x 10 Side clamshell with green 2 x 15 each Forward 8" runner step-up 2 x 10 each Dead lift 25# 2 x 10 Lateral band walk at counter with green at knees x 5 lengths down/back  [facility] Adult PT Treatment:                                                DATE: 05/22/2023 Therapeutic Exercise: NuStep L5 x 5 min with UE/LE while taking subjective Supine piriformis stretch push/pull 2 x 30 sec each Supine hamstring stretch with strap 2 x 30 sec each LTR x 10 Bridge 3 x 10 Side clamshell with green 3 x 10 each Squat to chair tap 2 x 10 Forward 6" runner step-up 2 x 10 on right Manual: Roller to right glute, piriformis, ITB, lateral quad and hamstring  [facility] Adult PT Treatment:                                                DATE: 05/16/2023 Therapeutic Exercise: NuStep lvl 5 UE/LE x 5 min while taking subjective Seated fig 4 stretch 2x30" R Supine clamshell 2x15 RTB S/L clamshell 2x10 RTB Bridge 3x10 Supine ball squeeze 2x10 - 5" hold Supine SLR 2x10 each Supine fig 4 stretch x 30" R STS 2x10 - low table no UE Piriformis release at wall x 60" Lateral walk YTB x 3 laps at counter Standing hip abd 2x10 YTB  PATIENT EDUCATION:  Education details: HEP Person educated: Patient Education method: Programmer, multimedia, Demonstration, Actor cues, Verbal cues Education comprehension: verbalized understanding, returned demonstration, verbal cues required, tactile cues required, and needs further education  HOME EXERCISE PROGRAM: Access Code: [email or web address]  ASSESSMENT: CLINICAL IMPRESSION: Patient tolerated therapy well with no adverse effects. She does report an improvement in her functional status on FOTO and better  standing tolerance. Therapy continues to focus primarily on progressing her hip and LE strengthening. She was able to tolerate increased weight with squat and lifting patterns, but did require cueing for lifting mechanics with heavier weight. She did report continued bilateral knee discomfort with exercises this visit. No changes to HEP this visit. Patient would benefit from continued skilled PT to progress her mobility and strength in order to reduce pain and maximize functional ability.    OBJECTIVE IMPAIRMENTS: Abnormal gait, decreased activity tolerance, difficulty walking, decreased ROM, decreased strength, impaired flexibility, postural dysfunction, and pain.   ACTIVITY LIMITATIONS: carrying, standing, sleeping, stairs, transfers, bed mobility, and locomotion level  PARTICIPATION LIMITATIONS: meal prep, cleaning, shopping, and community activity  PERSONAL FACTORS: Fitness, Past/current experiences, and Time since onset of injury/illness/exacerbation are also affecting patient's functional outcome.    GOALS: Goals reviewed with patient? Yes  SHORT TERM GOALS: Target date: 06/04/2023  Patient will be I with initial HEP in order to progress with therapy. Baseline: HEP provided at eval 05/30/2023: independent Goal status: MET  2.  Patient will report pain with standing and/or walking </= 5/10 in order to reduce functional limitations Baseline: 8.5/10 05/30/2023: 4/10 Goal status: MET  LONG TERM GOALS: Target date: 07/02/2023  Patient will be I with final HEP to maintain progress from PT. Baseline: HEP provided at eval Goal status: INITIAL  2.  Patient will report >/= 56% status on FOTO to indicate improved functional ability. Baseline: 36% functional status 06/14/2023: 49% Goal status: ONGOING  3.  Patient will demonstrate right hip strength >/= 4-/5 MMT in order to improve her walking and standing tolerance Baseline: see limitations noted above Goal status: INITIAL  4.   Patient will be able to stand and/or walk >/= 30 minutes in order to improve ability to perform household chores and improve shopping ability. Baseline: 10-15 minutes 06/14/2023: reports can stand 15-20 minutes Goal status: ONGOING   PLAN: PT FREQUENCY: 1x/week  PT DURATION: 8 weeks  PLANNED INTERVENTIONS: Therapeutic exercises, Therapeutic activity, Neuromuscular re-education, Balance training, Gait training, Patient/Family education, Self Care, and Joint mobilization  PLAN FOR NEXT SESSION: Review HEP and progress PRN, manual/TPDN for right gluteal and piriformis region, right hip and thigh stretching, progress hip strengthening   Rosana Hoes, PT, DPT, LAT, ATC 06/14/23  10:15 AM Phone: [phone] Fax: [phone]

## 2023-06-21 ENCOUNTER — Ambulatory Visit: Payer: Medicare PPO | Attending: Family Medicine

## 2023-06-21 DIAGNOSIS — M25551 Pain in right hip: Secondary | ICD-10-CM | POA: Diagnosis present

## 2023-06-21 DIAGNOSIS — M6281 Muscle weakness (generalized): Secondary | ICD-10-CM | POA: Insufficient documentation

## 2023-06-21 DIAGNOSIS — M25611 Stiffness of right shoulder, not elsewhere classified: Secondary | ICD-10-CM | POA: Insufficient documentation

## 2023-06-21 NOTE — Therapy (Signed)
OUTPATIENT PHYSICAL THERAPY TREATMENT NOTE   Patient Name: Doris Green MRN: [id number] DOB:Jan 24, 1956, 67 y.o., female Today's Date: 06/21/2023   END OF SESSION:  PT End of Session - 06/21/23 1058     Visit Number 6    Number of Visits 9    Date for PT Re-Evaluation 07/02/23    Authorization Type [employer] MCR    Authorization Time Period 05/16/2023 - 07/14/2023    Authorization - Visit Number 6    Authorization - Number of Visits 8    Progress Note Due on Visit 10    PT Start Time 1100    PT Stop Time 1140    PT Time Calculation (min) 40 min    Activity Tolerance Patient tolerated treatment well    Behavior During Therapy [facility] for tasks assessed/performed                  Past Medical History:  Diagnosis Date   Allergy    Arthritis    Depression    History of blood transfusion    Spider bite    2016 and 2017   UTERINE FIBROID 12/13/2006   Past Surgical History:  Procedure Laterality Date   SHOULDER SURGERY     TUBAL LIGATION     Patient Active Problem List   Diagnosis Date Noted   Morbid obesity, unspecified obesity type (HCC) 01/05/2021   Allergic rhinitis 08/16/2019   Chronic pain of right knee 08/16/2019   Prediabetes 08/16/2019   Essential hypertension 03/05/2018   Preventative health care 10/12/2011   OBESITY 08/31/2008   GEN OSTEOARTHROSIS INVOLVING MULTIPLE SITES 08/31/2008   DEPRESSION, MAJOR, RECURRENT 12/13/2006    PCP: Barbette Merino, NP  REFERRING PROVIDER: Corliss Blacker, MD  REFERRING DIAG: Pain in right hip  THERAPY DIAG:  Pain in right hip  Muscle weakness (generalized)  Stiffness of right shoulder, not elsewhere classified  Rationale for Evaluation and Treatment: Rehabilitation  ONSET DATE: "A couple of months"   SUBJECTIVE:  SUBJECTIVE STATEMENT: Pt presents to PT with no current R hip pain. Has been compliant with HEP.   PERTINENT HISTORY: HTN  PAIN:  Are you having pain? 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"   OBJECTIVE:  PATIENT SURVEYS:  FOTO 36% functional status  06/14/2023: 49%  MUSCLE LENGTH: Piriformis muscle tightness, slight deficit of hamstring and quad  POSTURE:   Rounded shoulder posture,   PALPATION: Generalized tenderness of right gluteal and lateral hip/greater trochanter region  LUMBAR ROM:   Active  A/PROM  eval  Flexion WFL  Extension WFL  Right lateral flexion WFL  Left lateral flexion WFL  Right rotation 75%  Left rotation 75%   (Blank rows = not tested)  Patient reports feeling right buttock pain with lumbar rotation  LOWER EXTREMITY ROM:   Right hip PROM grossly [facility]  LOWER EXTREMITY MMT:  MMT Right eval Left eval Right 05/22/2023  Hip flexion 4- 4   Hip extension 3 4- 3+  Hip abduction 3+ 4- 4-  Hip adduction     Hip internal rotation     Hip external rotation     Knee flexion 5 5   Knee extension 4+ 5   Ankle  dorsiflexion     Ankle plantarflexion     Ankle inversion     Ankle eversion      (Blank rows = not tested)  LOWER EXTREMITY SPECIAL TESTS:  Not assessed  FUNCTIONAL TESTS:  Sit to stand:   GAIT: Assistive device utilized: None Level of assistance: Complete Independence Comments: Antalgic on right   TODAY'S TREATMENT:     [facility] Adult PT Treatment:                                                DATE: 06/21/2023 Therapeutic Exercise: NuStep L6 x 5 min with LE while taking subjective Supine SLR 2x10 2# S/L hip abd 2x10 2# Bridge 3x10 GTB Hooklying unilateral clamshell with GTB 2x15 each Goblet squat to table tap with 15# 3x10 Dead lift 30# KB 2x10 Lateral walk GTB x 2 laps at counter Standing hip ext  2x10 GTB  [facility] Adult PT Treatment:                                                DATE: 06/14/2023 Therapeutic Exercise: NuStep L6 x 5 min with LE while taking subjective Bridge 2 x 10 Sidelying hip abduction 3 x 15 each Hooklying unilateral clamshell with blue 2 x 10 each Goblet squat to table tap with 10# 3 x 10 Lateral 6" step up and over 2 x 10 Dead lift 30# KB 3 x 10   [facility] Adult PT Treatment:                                                DATE: 05/30/2023 Therapeutic Exercise: NuStep L6 x 5 min with UE/LE while taking subjective Supine piriformis stretch push/pull 2 x 30 sec each Modified thomas stretch 3 x 30 sec on right Hooklying bent knee fall out 2 x 10 each LTR x 10 Bridge 2 x 10 Side clamshell with green 2 x 15 each Forward 8" runner step-up 2 x 10 each Dead lift 25# 2 x 10 Lateral band walk at counter with green at knees x 5 lengths down/back  PATIENT EDUCATION:  Education details: HEP Person educated: Patient Education method: Programmer, multimedia, Demonstration, Tactile cues, Verbal cues Education comprehension: verbalized understanding, returned demonstration, verbal cues required, tactile cues required, and needs further education  HOME EXERCISE PROGRAM: Access Code: [email or web address]   ASSESSMENT: CLINICAL IMPRESSION: Pt was able to complete all prescribed exercises with no adverse effect or increase in pain. Therapy focused on improving proximal hip strength and functional mobility. Continues to benefit from skilled PT services, will progress per POC as able.     OBJECTIVE IMPAIRMENTS: Abnormal gait, decreased activity tolerance, difficulty walking, decreased ROM, decreased strength, impaired flexibility, postural dysfunction, and pain.   ACTIVITY LIMITATIONS: carrying, standing, sleeping, stairs, transfers, bed mobility, and locomotion level  PARTICIPATION LIMITATIONS: meal prep, cleaning, shopping, and community activity  PERSONAL FACTORS: Fitness, Past/current  experiences, and Time since onset of injury/illness/exacerbation are also affecting patient's functional outcome.    GOALS: Goals reviewed with patient? Yes  SHORT TERM GOALS: Target date: 06/04/2023  Patient will be I with  initial HEP in order to progress with therapy. Baseline: HEP provided at eval 05/30/2023: independent Goal status: MET  2.  Patient will report pain with standing and/or walking </= 5/10 in order to reduce functional limitations Baseline: 8.5/10 05/30/2023: 4/10 Goal status: MET  LONG TERM GOALS: Target date: 07/02/2023  Patient will be I with final HEP to maintain progress from PT. Baseline: HEP provided at eval Goal status: INITIAL  2.  Patient will report >/= 56% status on FOTO to indicate improved functional ability. Baseline: 36% functional status 06/14/2023: 49% Goal status: ONGOING  3.  Patient will demonstrate right hip strength >/= 4-/5 MMT in order to improve her walking and standing tolerance Baseline: see limitations noted above Goal status: INITIAL  4.  Patient will be able to stand and/or walk >/= 30 minutes in order to improve ability to perform household chores and improve shopping ability. Baseline: 10-15 minutes 06/14/2023: reports can stand 15-20 minutes Goal status: ONGOING   PLAN: PT FREQUENCY: 1x/week  PT DURATION: 8 weeks  PLANNED INTERVENTIONS: Therapeutic exercises, Therapeutic activity, Neuromuscular re-education, Balance training, Gait training, Patient/Family education, Self Care, and Joint mobilization  PLAN FOR NEXT SESSION: Review HEP and progress PRN, manual/TPDN for right gluteal and piriformis region, right hip and thigh stretching, progress hip strengthening   Eloy End PT  06/21/23 11:40 AM

## 2023-06-28 ENCOUNTER — Ambulatory Visit: Payer: Medicare PPO

## 2023-06-28 DIAGNOSIS — M6281 Muscle weakness (generalized): Secondary | ICD-10-CM

## 2023-06-28 DIAGNOSIS — M25551 Pain in right hip: Secondary | ICD-10-CM | POA: Diagnosis not present

## 2023-06-28 NOTE — Therapy (Signed)
OUTPATIENT PHYSICAL THERAPY TREATMENT NOTE   Patient Name: Doris Green MRN: [id number] DOB:08/30/1956, 67 y.o., female Today's Date: 06/28/2023   END OF SESSION:  PT End of Session - 06/28/23 1313     Visit Number 7    Number of Visits 9    Date for PT Re-Evaluation 07/17/23    Authorization Type [employer] M[employer]    Authorization Time Period 05/16/2023 - 07/14/2023    Authorization - Visit Number 7    Authorization - Number of Visits 8    Progress Note Due on Visit 10    PT Start Time 1315    PT Stop Time 1353    PT Time Calculation (min) 38 min    Activity Tolerance Patient tolerated treatment well    Behavior During Therapy [facility] for tasks assessed/performed                   Past Medical History:  Diagnosis Date   Allergy    Arthritis    Depression    History of blood transfusion    Spider bite    2016 and 2017   UTERINE FIBROID 12/13/2006   Past Surgical History:  Procedure Laterality Date   SHOULDER SURGERY     TUBAL LIGATION     Patient Active Problem List   Diagnosis Date Noted   Morbid obesity, unspecified obesity type (HCC) 01/05/2021   Allergic rhinitis 08/16/2019   Chronic pain of right knee 08/16/2019   Prediabetes 08/16/2019   Essential hypertension 03/05/2018   Preventative health care 10/12/2011   OBESITY 08/31/2008   GEN OSTEOARTHROSIS INVOLVING MULTIPLE SITES 08/31/2008   DEPRESSION, MAJOR, RECURRENT 12/13/2006    PCP: Barbette Merino, NP  REFERRING PROVIDER: Corliss Blacker, MD  REFERRING DIAG: Pain in right hip  THERAPY DIAG:  Pain in right hip - Plan: PT plan of care cert/re-cert  Muscle weakness (generalized) - Plan: PT plan of care cert/re-cert  Rationale for Evaluation and Treatment: Rehabilitation  ONSET DATE: "A couple of months"   SUBJECTIVE:  SUBJECTIVE STATEMENT: Pt presents to PT with reports of continued R hip pain and discomfort. Has been compliant with HEP with no adverse effect.  PERTINENT  HISTORY: HTN  PAIN:  Are you having pain? 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"   OBJECTIVE:  PATIENT SURVEYS:  FOTO 36% functional status  06/14/2023: 49%  MUSCLE LENGTH: Piriformis muscle tightness, slight deficit of hamstring and quad  POSTURE:   Rounded shoulder posture,   PALPATION: Generalized tenderness of right gluteal and lateral hip/greater trochanter region  LUMBAR ROM:   Active  A/PROM  eval  Flexion WFL  Extension WFL  Right lateral flexion WFL  Left lateral flexion WFL  Right rotation 75%  Left rotation 75%   (Blank rows = not tested)  Patient reports feeling right buttock pain with lumbar rotation  LOWER EXTREMITY ROM:   Right hip PROM grossly [facility]  LOWER EXTREMITY MMT:  MMT Right eval Left eval Right 05/22/2023  Hip flexion 4- 4   Hip extension 3 4- 3+  Hip abduction 3+ 4- 4-  Hip adduction     Hip internal rotation     Hip external rotation  Knee flexion 5 5   Knee extension 4+ 5   Ankle dorsiflexion     Ankle plantarflexion     Ankle inversion     Ankle eversion      (Blank rows = not tested)  LOWER EXTREMITY SPECIAL TESTS:  Not assessed  FUNCTIONAL TESTS:  Sit to stand:   GAIT: Assistive device utilized: None Level of assistance: Complete Independence Comments: Antalgic on right   TODAY'S TREATMENT:     [facility] Adult PT Treatment:                                                DATE: 06/28/2023 Therapeutic Exercise: NuStep L6 x 4 min with LE while taking subjective Supine SLR 2x10 2# S/L hip abd 2x10 2# Bridge 3x10 GTB S/L clamshell 2x10 GTB Dead lift 25# KB 3x8 Standing hip abd/ext 2x10 25# Lateral walk RTB x 4  laps at counter STS low chair x 10  [facility] Adult PT Treatment:                                                DATE: 06/21/2023 Therapeutic Exercise: NuStep L6 x 5 min with LE while taking subjective Supine SLR 2x10 2# S/L hip abd 2x10 2# Bridge 3x10 GTB Hooklying unilateral clamshell with GTB 2x15 each Goblet squat to table tap with 15# 3x10 Dead lift 30# KB 2x10 Lateral walk GTB x 2 laps at counter Standing hip ext 2x10 GTB  [facility] Adult PT Treatment:                                                DATE: 06/14/2023 Therapeutic Exercise: NuStep L6 x 5 min with LE while taking subjective Bridge 2 x 10 Sidelying hip abduction 3 x 15 each Hooklying unilateral clamshell with blue 2 x 10 each Goblet squat to table tap with 10# 3 x 10 Lateral 6" step up and over 2 x 10 Dead lift 30# KB 3 x 10   [facility] Adult PT Treatment:                                                DATE: 05/30/2023 Therapeutic Exercise: NuStep L6 x 5 min with UE/LE while taking subjective Supine piriformis stretch push/pull 2 x 30 sec each Modified thomas stretch 3 x 30 sec on right Hooklying bent knee fall out 2 x 10 each LTR x 10 Bridge 2 x 10 Side clamshell with green 2 x 15 each Forward 8" runner step-up 2 x 10 each Dead lift 25# 2 x 10 Lateral band walk at counter with green at knees x 5 lengths down/back  PATIENT EDUCATION:  Education details: HEP Person educated: Patient Education method: Explanation, Demonstration, Tactile cues, Verbal cues Education comprehension: verbalized understanding, returned demonstration, verbal cues required, tactile cues required, and needs further education  HOME EXERCISE PROGRAM: Access Code: [email or web address]   ASSESSMENT: CLINICAL IMPRESSION: Pt was able to complete all prescribed exercises with  no adverse effect or increase in pain. Therapy focused on improving proximal hip strength and functional mobility. Continues to benefit from skilled PT services, will progress per POC as  able.     OBJECTIVE IMPAIRMENTS: Abnormal gait, decreased activity tolerance, difficulty walking, decreased ROM, decreased strength, impaired flexibility, postural dysfunction, and pain.   ACTIVITY LIMITATIONS: carrying, standing, sleeping, stairs, transfers, bed mobility, and locomotion level  PARTICIPATION LIMITATIONS: meal prep, cleaning, shopping, and community activity  PERSONAL FACTORS: Fitness, Past/current experiences, and Time since onset of injury/illness/exacerbation are also affecting patient's functional outcome.    GOALS: Goals reviewed with patient? Yes  SHORT TERM GOALS: Target date: 06/04/2023  Patient will be I with initial HEP in order to progress with therapy. Baseline: HEP provided at eval 05/30/2023: independent Goal status: MET  2.  Patient will report pain with standing and/or walking </= 5/10 in order to reduce functional limitations Baseline: 8.5/10 05/30/2023: 4/10 Goal status: MET  LONG TERM GOALS: Target date: 07/17/2023  Patient will be I with final HEP to maintain progress from PT. Baseline: HEP provided at eval Goal status: INITIAL  2.  Patient will report >/= 56% status on FOTO to indicate improved functional ability. Baseline: 36% functional status 06/14/2023: 49% Goal status: ONGOING  3.  Patient will demonstrate right hip strength >/= 4-/5 MMT in order to improve her walking and standing tolerance Baseline: see limitations noted above Goal status: INITIAL  4.  Patient will be able to stand and/or walk >/= 30 minutes in order to improve ability to perform household chores and improve shopping ability. Baseline: 10-15 minutes 06/14/2023: reports can stand 15-20 minutes Goal status: ONGOING   PLAN: PT FREQUENCY: 1x/week  PT DURATION: 8 weeks  PLANNED INTERVENTIONS: Therapeutic exercises, Therapeutic activity, Neuromuscular re-education, Balance training, Gait training, Patient/Family education, Self Care, and Joint mobilization  PLAN  FOR NEXT SESSION: Review HEP and progress PRN, manual/TPDN for right gluteal and piriformis region, right hip and thigh stretching, progress hip strengthening   Eloy End PT  06/28/23 1:54 PM

## 2023-07-05 ENCOUNTER — Ambulatory Visit: Payer: Medicare PPO | Admitting: Physical Therapy

## 2023-07-10 ENCOUNTER — Other Ambulatory Visit: Payer: Self-pay

## 2023-07-10 ENCOUNTER — Encounter: Payer: Self-pay | Admitting: Physical Therapy

## 2023-07-10 ENCOUNTER — Ambulatory Visit: Payer: Medicare PPO | Admitting: Physical Therapy

## 2023-07-10 DIAGNOSIS — M6281 Muscle weakness (generalized): Secondary | ICD-10-CM

## 2023-07-10 DIAGNOSIS — M25551 Pain in right hip: Secondary | ICD-10-CM

## 2023-07-10 DIAGNOSIS — M25611 Stiffness of right shoulder, not elsewhere classified: Secondary | ICD-10-CM

## 2023-07-10 NOTE — Patient Instructions (Signed)
Access Code: [email or web address] URL: [email or web address] Date: 07/10/2023 Prepared by: Rosana Hoes  Exercises - Supine Piriformis Stretch with Foot on Ground  - 3 reps - 20 seconds hold - Supine Figure 4 Piriformis Stretch  - 3 reps - 20 seconds hold - Straight Leg Raise  - 2 sets - 10 reps - Bridge  - 2 sets - 10 reps - Clam with Resistance  - 2 sets - 15 reps - Sidelying Hip Abduction  - 2 sets - 15 reps - Sit to Stand Without Arm Support  - 3 sets - 10 reps

## 2023-07-10 NOTE — Therapy (Signed)
OUTPATIENT PHYSICAL THERAPY TREATMENT NOTE  DISCHARGE   Patient Name: Doris Green MRN: [id number] DOB:November 27, 1955, 67 y.o., female Today's Date: 07/10/2023   END OF SESSION:  PT End of Session - 07/10/23 1454     Visit Number 8    Number of Visits 9    Date for PT Re-Evaluation 07/17/23    Authorization Type [employer] M[employer]    Authorization Time Period 05/16/2023 - 07/14/2023    Authorization - Visit Number 8    Authorization - Number of Visits 8    Progress Note Due on Visit 10    PT Start Time 1445    PT Stop Time 1525    PT Time Calculation (min) 40 min    Activity Tolerance Patient tolerated treatment well    Behavior During Therapy [facility] for tasks assessed/performed                    Past Medical History:  Diagnosis Date   Allergy    Arthritis    Depression    History of blood transfusion    Spider bite    2016 and 2017   UTERINE FIBROID 12/13/2006   Past Surgical History:  Procedure Laterality Date   SHOULDER SURGERY     TUBAL LIGATION     Patient Active Problem List   Diagnosis Date Noted   Morbid obesity, unspecified obesity type (HCC) 01/05/2021   Allergic rhinitis 08/16/2019   Chronic pain of right knee 08/16/2019   Prediabetes 08/16/2019   Essential hypertension 03/05/2018   Preventative health care 10/12/2011   OBESITY 08/31/2008   GEN OSTEOARTHROSIS INVOLVING MULTIPLE SITES 08/31/2008   DEPRESSION, MAJOR, RECURRENT 12/13/2006    PCP: Barbette Merino, NP  REFERRING PROVIDER: Corliss Blacker, MD  REFERRING DIAG: Pain in right hip  THERAPY DIAG:  Pain in right hip  Muscle weakness (generalized)  Stiffness of right shoulder, not elsewhere classified  Rationale for Evaluation and Treatment: Rehabilitation  ONSET DATE: "A couple of months"   SUBJECTIVE:  SUBJECTIVE STATEMENT: Patient reports everything is aching today due to the weather, both hips, shoulder, everything. She does report the right hip feels better and  not as balled up. She feels she is ready for discharge today.   PERTINENT HISTORY: HTN  PAIN:  Are you having pain? 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"   OBJECTIVE:  PATIENT SURVEYS:  FOTO 36% functional status  06/14/2023: 49%  07/10/2023: 51%  MUSCLE LENGTH: Piriformis muscle tightness, slight deficit of hamstring and quad  POSTURE:   Rounded shoulder posture,   PALPATION: Generalized tenderness of right gluteal and lateral hip/greater trochanter region  LUMBAR ROM:   Active  A/PROM  eval  Flexion WFL  Extension WFL  Right lateral flexion WFL  Left lateral flexion WFL  Right rotation 75%  Left rotation 75%   (Blank rows = not tested)  Patient reports feeling right buttock pain with lumbar rotation  LOWER EXTREMITY ROM:   Right hip PROM grossly [facility]  LOWER EXTREMITY MMT:  MMT Right eval Left eval Right 05/22/2023  Hip flexion 4- 4   Hip extension 3 4- 3+  Hip abduction 3+ 4- 4-  Hip adduction  Hip internal rotation     Hip external rotation     Knee flexion 5 5   Knee extension 4+ 5   Ankle dorsiflexion     Ankle plantarflexion     Ankle inversion     Ankle eversion      (Blank rows = not tested)  LOWER EXTREMITY SPECIAL TESTS:  Not assessed  FUNCTIONAL TESTS:  Sit to stand:   GAIT: Assistive device utilized: None Level of assistance: Complete Independence Comments: Antalgic on right   TODAY'S TREATMENT:     [facility] Adult PT Treatment:                                                DATE: 07/10/2023 Therapeutic Exercise: NuStep L6 x 5 min with LE while taking subjective Piriformis stretch 2 x 20 sec each Bridge 2 x 10 SLR 2 x 10  each Sidelying hip abduction 2 x 15 each Goblet squat to table tap with 10# 3 x 10   [facility] Adult PT Treatment:                                                DATE: 06/28/2023 Therapeutic Exercise: NuStep L6 x 4 min with LE while taking subjective Supine SLR 2x10 2# S/L hip abd 2x10 2# Bridge 3x10 GTB S/L clamshell 2x10 GTB Dead lift 25# KB 3x8 Standing hip abd/ext 2x10 25# Lateral walk RTB x 4 laps at counter STS low chair x 10  [facility] Adult PT Treatment:                                                DATE: 06/21/2023 Therapeutic Exercise: NuStep L6 x 5 min with LE while taking subjective Supine SLR 2x10 2# S/L hip abd 2x10 2# Bridge 3x10 GTB Hooklying unilateral clamshell with GTB 2x15 each Goblet squat to table tap with 15# 3x10 Dead lift 30# KB 2x10 Lateral walk GTB x 2 laps at counter Standing hip ext 2x10 GTB  [facility] Adult PT Treatment:                                                DATE: 06/14/2023 Therapeutic Exercise: NuStep L6 x 5 min with LE while taking subjective Bridge 2 x 10 Sidelying hip abduction 3 x 15 each Hooklying unilateral clamshell with blue 2 x 10 each Goblet squat to table tap with 10# 3 x 10 Lateral 6" step up and over 2 x 10 Dead lift 30# KB 3 x 10  PATIENT EDUCATION:  Education details: POC discharge, HEP Person educated: Patient Education method: Explanation Education comprehension: Verbalized understanding  HOME EXERCISE PROGRAM: Access Code: [email or web address]   ASSESSMENT: CLINICAL IMPRESSION: Patient tolerated therapy well with no adverse effects. She has made great progress in therapy, reporting improvement in right hip pain and function, and demonstrating improved hip strength and tolerance for walking and standing tasks. 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 Patient reports that she pleased with current functional  status so will be formally discharged from PT at this time.     OBJECTIVE IMPAIRMENTS: Abnormal gait, decreased activity tolerance, difficulty walking, decreased ROM, decreased strength, impaired flexibility, postural dysfunction, and pain.   ACTIVITY LIMITATIONS: carrying, standing, sleeping, stairs, transfers, bed mobility, and locomotion level  PARTICIPATION LIMITATIONS: meal prep, cleaning, shopping, and community activity  PERSONAL FACTORS: Fitness, Past/current experiences, and Time since onset of injury/illness/exacerbation are also affecting patient's functional outcome.    GOALS: Goals reviewed with patient? Yes  SHORT TERM GOALS: Target date: 06/04/2023  Patient will be I with initial HEP in order to progress with therapy. Baseline: HEP provided at eval 05/30/2023: independent Goal status: MET  2.  Patient will report pain with standing and/or walking </= 5/10 in order to reduce functional limitations Baseline: 8.5/10 05/30/2023: 4/10 Goal status: MET  LONG TERM GOALS: Target date: 07/17/2023  Patient will be I with final HEP to maintain progress from PT. Baseline: HEP provided at eval 07/10/2023: independent Goal status: MET  2.  Patient will report >/= 56% status on FOTO to indicate improved functional ability. Baseline: 36% functional status 06/14/2023: 49% 07/10/2023: 51% Goal status: PARTIALLY MET  3.  Patient will demonstrate right hip strength >/= 4-/5 MMT in order to improve her walking and standing tolerance Baseline: see limitations noted above 07/10/2023: grossly 4/5 MMT Goal status: PARTIALLY MET  4.  Patient will be able to stand and/or walk >/= 30 minutes in order to improve ability to perform household chores and improve shopping ability. Baseline: 10-15 minutes 06/14/2023: reports can stand 15-20 minutes 07/10/2023: 20 minutes Goal status: PARTIALLY MET   PLAN: PT FREQUENCY: 1x/week  PT DURATION: 8 weeks  PLANNED INTERVENTIONS: Therapeutic  exercises, Therapeutic activity, Neuromuscular re-education, Balance training, Gait training, Patient/Family education, Self Care, and Joint mobilization  PLAN FOR NEXT SESSION: NA - discharge   Rosana Hoes, PT, DPT, LAT, ATC 07/10/23  3:30 PM Phone: [phone] Fax: [phone]   PHYSICAL THERAPY DISCHARGE SUMMARY  Visits from Start of Care: 8  Current functional level related to goals / functional outcomes: See above   Remaining deficits: See above   Education / Equipment: HEP   Patient agrees to discharge. Patient goals were partially met. Patient is being discharged due to being pleased with the current functional level.

## 2023-07-12 ENCOUNTER — Encounter: Payer: Medicare PPO | Admitting: Physical Therapy

## 2024-01-01 ENCOUNTER — Ambulatory Visit (AMBULATORY_SURGERY_CENTER)

## 2024-01-01 VITALS — Ht 62.0 in | Wt 222.0 lb

## 2024-01-01 DIAGNOSIS — Z860100 Personal history of colon polyps, unspecified: Secondary | ICD-10-CM

## 2024-01-01 HISTORY — DX: Morbid (severe) obesity due to excess calories: E66.01

## 2024-01-01 MED ORDER — NA SULFATE-K SULFATE-MG SULF 17.5-3.13-1.6 GM/177ML PO SOLN: 1.0000 | Freq: Once | ORAL | 0 refills | Status: AC

## 2024-01-01 NOTE — Progress Notes (Signed)
 No egg or soy allergy known to patient  No issues known to pt with past sedation with any surgeries or procedures Patient denies ever being told they had issues or difficulty with intubation  No FH of Malignant Hyperthermia Pt is not on diet pills Pt is not on  home 02  Pt is not on blood thinners  Pt denies issues with constipation  No A fib or A flutter Have any cardiac testing pending-- no  LOA: independent  Prep: suprep   Patient's chart reviewed by Cathlyn Parsons CNRA prior to previsit and patient appropriate for the [facility].  Previsit completed and red dot placed by patient's name on their procedure day (on provider's schedule).     PV completed with patient. Prep instructions sent to home address.

## 2024-01-27 ENCOUNTER — Encounter: Payer: Self-pay | Admitting: Certified Registered Nurse Anesthetist

## 2024-01-29 ENCOUNTER — Encounter: Payer: Self-pay | Admitting: Gastroenterology

## 2024-01-31 ENCOUNTER — Telehealth: Payer: Self-pay | Admitting: Gastroenterology

## 2024-01-31 ENCOUNTER — Encounter: Admitting: Gastroenterology

## 2024-01-31 NOTE — Telephone Encounter (Signed)
 Hi Dr Dominic Friendly,   I called patient regarding her procedure, she stated she would not be in for the procedure and has replied NO to the reminder call on the 10th at 2:16 PM.   Thank you

## 2024-03-11 ENCOUNTER — Other Ambulatory Visit: Payer: Self-pay | Admitting: Internal Medicine

## 2024-03-11 DIAGNOSIS — Z1231 Encounter for screening mammogram for malignant neoplasm of breast: Secondary | ICD-10-CM

## 2024-03-28 ENCOUNTER — Ambulatory Visit
Admission: RE | Admit: 2024-03-28 | Discharge: 2024-03-28 | Disposition: A | Discharge status: Home / Self Care | Source: Ambulatory Visit | Attending: Internal Medicine | Admitting: Internal Medicine

## 2024-03-28 DIAGNOSIS — Z1231 Encounter for screening mammogram for malignant neoplasm of breast: Secondary | ICD-10-CM
